# Patient Record
Sex: Female | Born: 1955 | Race: White | Hispanic: No | Marital: Married | Smoking: Never smoker
Health system: Southern US, Community
[De-identification: ages and names within clinical notes are randomized; demographics above are authoritative.]

## PROBLEM LIST (undated history)

## (undated) DIAGNOSIS — E119 Type 2 diabetes mellitus without complications: Secondary | ICD-10-CM

## (undated) DIAGNOSIS — K219 Gastro-esophageal reflux disease without esophagitis: Secondary | ICD-10-CM

## (undated) DIAGNOSIS — D510 Vitamin B12 deficiency anemia due to intrinsic factor deficiency: Secondary | ICD-10-CM

## (undated) DIAGNOSIS — K200 Eosinophilic esophagitis: Secondary | ICD-10-CM

## (undated) DIAGNOSIS — E785 Hyperlipidemia, unspecified: Secondary | ICD-10-CM

## (undated) DIAGNOSIS — U071 COVID-19: Secondary | ICD-10-CM

## (undated) DIAGNOSIS — F32A Depression, unspecified: Secondary | ICD-10-CM

## (undated) DIAGNOSIS — F329 Major depressive disorder, single episode, unspecified: Secondary | ICD-10-CM

## (undated) HISTORY — DX: Hyperlipidemia, unspecified: E78.5

## (undated) HISTORY — PX: BREAST SURGERY: SHX581

## (undated) HISTORY — DX: Type 2 diabetes mellitus without complications: E11.9

## (undated) HISTORY — DX: Depression, unspecified: F32.A

## (undated) HISTORY — PX: CHOLECYSTECTOMY OPEN: SUR202

## (undated) HISTORY — DX: Eosinophilic esophagitis: K20.0

## (undated) HISTORY — DX: COVID-19: U07.1

---
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
## 2004-11-29 HISTORY — PX: ABDOMINAL HYSTERECTOMY: SHX81

## 2004-11-29 HISTORY — PX: SUPRACERVICAL ABDOMINAL HYSTERECTOMY: SHX5393

## 2007-11-30 HISTORY — PX: BREAST EXCISIONAL BIOPSY: SUR124

## 2017-11-29 DIAGNOSIS — K222 Esophageal obstruction: Secondary | ICD-10-CM

## 2017-11-29 HISTORY — DX: Esophageal obstruction: K22.2

## 2020-01-30 ENCOUNTER — Ambulatory Visit (INDEPENDENT_AMBULATORY_CARE_PROVIDER_SITE_OTHER): Payer: 59 | Admitting: Family

## 2020-01-30 ENCOUNTER — Other Ambulatory Visit: Payer: Self-pay

## 2020-01-30 ENCOUNTER — Encounter: Payer: Self-pay | Admitting: Family

## 2020-01-30 DIAGNOSIS — E119 Type 2 diabetes mellitus without complications: Secondary | ICD-10-CM | POA: Diagnosis not present

## 2020-01-30 DIAGNOSIS — R454 Irritability and anger: Secondary | ICD-10-CM | POA: Diagnosis not present

## 2020-01-30 DIAGNOSIS — Z0000 Encounter for general adult medical examination without abnormal findings: Secondary | ICD-10-CM | POA: Insufficient documentation

## 2020-01-30 DIAGNOSIS — K219 Gastro-esophageal reflux disease without esophagitis: Secondary | ICD-10-CM | POA: Diagnosis not present

## 2020-01-30 DIAGNOSIS — E785 Hyperlipidemia, unspecified: Secondary | ICD-10-CM | POA: Insufficient documentation

## 2020-01-30 MED ORDER — ONETOUCH VERIO W/DEVICE KIT: 1.0000 | PACK | Freq: Every day | 0 refills | Status: DC | PRN

## 2020-01-30 MED ORDER — CITALOPRAM HYDROBROMIDE 20 MG PO TABS: 20.0000 mg | ORAL_TABLET | Freq: Every day | ORAL | 1 refills | Status: DC

## 2020-01-30 MED ORDER — ONETOUCH ULTRASOFT LANCETS MISC: 4 refills | Status: AC

## 2020-01-30 MED ORDER — ONETOUCH VERIO VI STRP: ORAL_STRIP | 4 refills | Status: DC

## 2020-01-30 NOTE — Assessment & Plan Note (Signed)
Reviewed past medical history.  Advised patient to return the office for CPE, and pelvic exam in particular to determine if she has a cervix

## 2020-01-30 NOTE — Assessment & Plan Note (Signed)
Suspect controlled.  Pending lipid panel.  Discussed with patient overall cardiovascular risk, will calculate this with the have total cholesterol, HDL etc. however I do not see a reason for patient to be on 325 mg of ASA, advised her to take 81 mg tablet asa.  advised her that as she approaches her 70s, we should readdress the risk versus benefit of this medication

## 2020-01-30 NOTE — Assessment & Plan Note (Signed)
Uncontrolled, will increase Celexa 20 mg which patient has been on in the past that has worked well.

## 2020-01-30 NOTE — Progress Notes (Signed)
Virtual Visit via Video Note  I connected with@  on 01/30/20 at 10:00 AM EST by a video enabled telemedicine application and verified that I am speaking with the correct person using two identifiers.  Location patient: home Location provider:work Persons participating in the virtual visit: patient, provider  I discussed the limitations of evaluation and management by telemedicine and the availability of in person appointments. The patient expressed understanding and agreed to proceed.   HPI: Moved from [place] 6 months ago.  Establish care Feels well today. No complaints today  HTN- never had high blood pressure except in doctors office. Doesn't check BP at home. No cp, sob.   HLD- compliant with lipitor.   DM- compliant with medication. Not checking blood sugar. Has numbness in feet 'when sugars are elevated.' Last a1c 7.1. Needs new glucometer.   GERD/ eosinophilic esophagitis- on protonix. No regurgitation, pain or trouble swallowing.   Mood irritably and anxiety- started during menopause when irritible and when mother passed away. Felt better on celexa 18m. Feels 'short fused.'  No si/hi.   On asa 3265m No h/o mi, stent, cad.    ROS: See pertinent positives and negatives per HPI.  Past Medical History:  Diagnosis Date  . Depression   . Diabetes mellitus without complication ([place]  . Eosinophilic esophagitis   . Esophageal stricture 2019  . Hyperlipidemia     Past Surgical History:  Procedure Laterality Date  . CHOLECYSTECTOMY OPEN    . HYSTEROTOMY  2006   has ovaries. Had heavy periods and was anemic.     History reviewed. No pertinent family history.  .   Current Outpatient Medications:  .  aspirin EC 81 MG tablet, Take 81 mg by mouth daily., Disp: , Rfl:  .  atorvastatin (LIPITOR) 40 MG tablet, Take 40 mg by mouth daily., Disp: , Rfl:  .  Calcium-Vitamin D-Vitamin K (CALCIUM SOFT CHEWS PO), Take 1 tablet by mouth daily., Disp: , Rfl:  .   Cholecalciferol (VITAMIN D3) 50 MCG (2000 UT) capsule, Take 2,000 Units by mouth daily., Disp: , Rfl:  .  citalopram (CELEXA) 20 MG tablet, Take 1 tablet (20 mg total) by mouth daily., Disp: 90 tablet, Rfl: 1 .  glipiZIDE (GLUCOTROL XL) 10 MG 24 hr tablet, Take 10 mg by mouth daily with breakfast., Disp: , Rfl:  .  lisinopril (ZESTRIL) 5 MG tablet, Take 5 mg by mouth daily., Disp: , Rfl:  .  metFORMIN (GLUCOPHAGE) 1000 MG tablet, Take 1,000 mg by mouth 2 (two) times daily with a meal., Disp: , Rfl:  .  Multiple Vitamins-Minerals (CENTRUM SILVER ADULT 50+ PO), Take 1 tablet by mouth daily., Disp: , Rfl:  .  pantoprazole (PROTONIX) 40 MG tablet, Take 40 mg by mouth daily., Disp: , Rfl:  .  Blood Glucose Monitoring Suppl (ONETOUCH VERIO) w/Device KIT, 1 Device by Does not apply route daily as needed., Disp: 1 kit, Rfl: 0 .  glucose blood (ONETOUCH VERIO) test strip, Used to check blood sugars once a day., Disp: 100 each, Rfl: 4 .  Lancets (ONETOUCH ULTRASOFT) lancets, Used to check blood sugars once a day., Disp: 100 each, Rfl: 4  EXAM:  VITALS per patient if applicable:  GENERAL: alert, oriented, appears well and in no acute distress  HEENT: atraumatic, conjunttiva clear, no obvious abnormalities on inspection of external nose and ears  NECK: normal movements of the head and neck  LUNGS: on inspection no signs of respiratory distress, breathing rate appears normal, no  obvious gross SOB, gasping or wheezing  CV: no obvious cyanosis  MS: moves all visible extremities without noticeable abnormality  PSYCH/NEURO: pleasant and cooperative, no obvious depression or anxiety, speech and thought processing grossly intact  ASSESSMENT AND PLAN:  Discussed the following assessment and plan:  Encounter for medical examination to establish care - Plan: Ambulatory referral to Gastroenterology, MM 3D SCREEN BREAST BILATERAL, CBC with Differential/Platelet, Comprehensive metabolic panel, Hemoglobin  A1c, Lipid panel, TSH, VITAMIN D 25 Hydroxy (Vit-D Deficiency, Fractures), HIV Antibody (routine testing w rflx), Hepatitis C antibody, citalopram (CELEXA) 20 MG tablet  Type 2 diabetes mellitus without complication, without long-term current use of insulin ([place]) - Plan: Hemoglobin A1c  Hyperlipidemia, unspecified hyperlipidemia type  Gastroesophageal reflux disease, unspecified whether esophagitis present  Irritable mood Problem List Items Addressed This Visit      Digestive   GERD (gastroesophageal reflux disease)    No apparent alarm features today however patient does have history of esophageal stricture, EoE.  Referral to GI for further surveillance and management.  Patient also due for colonoscopy and I suspect EGD as well. Will follow.       Relevant Medications   pantoprazole (PROTONIX) 40 MG tablet     Endocrine   DM (diabetes mellitus) ([place])    Per patient likely uncontrolled.  Pending A1c.      Relevant Medications   metFORMIN (GLUCOPHAGE) 1000 MG tablet   glipiZIDE (GLUCOTROL XL) 10 MG 24 hr tablet   lisinopril (ZESTRIL) 5 MG tablet   atorvastatin (LIPITOR) 40 MG tablet   aspirin EC 81 MG tablet   Other Relevant Orders   Hemoglobin A1c     Other   Encounter for medical examination to establish care    Reviewed past medical history.  Advised patient to return the office for CPE, and pelvic exam in particular to determine if she has a cervix      Relevant Medications   citalopram (CELEXA) 20 MG tablet   Other Relevant Orders   Ambulatory referral to Gastroenterology   MM 3D SCREEN BREAST BILATERAL   CBC with Differential/Platelet   Comprehensive metabolic panel   Hemoglobin A1c   Lipid panel   TSH   VITAMIN D 25 Hydroxy (Vit-D Deficiency, Fractures)   HIV Antibody (routine testing w rflx)   Hepatitis C antibody   HLD (hyperlipidemia)    Suspect controlled.  Pending lipid panel.  Discussed with patient overall cardiovascular risk, will calculate this with  the have total cholesterol, HDL etc. however I do not see a reason for patient to be on 325 mg of ASA, advised her to take 81 mg tablet asa.  advised her that as she approaches her 70s, we should readdress the risk versus benefit of this medication      Relevant Medications   lisinopril (ZESTRIL) 5 MG tablet   atorvastatin (LIPITOR) 40 MG tablet   aspirin EC 81 MG tablet   Irritable mood    Uncontrolled, will increase Celexa 20 mg which patient has been on in the past that has worked well.          -we discussed possible serious and likely etiologies, options for evaluation and workup, limitations of telemedicine visit vs in person visit, treatment, treatment risks and precautions. Pt prefers to treat via telemedicine empirically rather then risking or undertaking an in person visit at this moment. Patient agrees to seek prompt in person care if worsening, new symptoms arise, or if is not improving with treatment.  I discussed the assessment and treatment plan with the patient. The patient was provided an opportunity to ask questions and all were answered. The patient agreed with the plan and demonstrated an understanding of the instructions.   The patient was advised to call back or seek an in-person evaluation if the symptoms worsen or if the condition fails to improve as anticipated.   Danielle Paris, FNP

## 2020-01-30 NOTE — Assessment & Plan Note (Signed)
Per patient likely uncontrolled.  Pending A1c.

## 2020-01-30 NOTE — Assessment & Plan Note (Signed)
No apparent alarm features today however patient does have history of esophageal stricture, EoE.  Referral to GI for further surveillance and management.  Patient also due for colonoscopy and I suspect EGD as well. Will follow.

## 2020-01-30 NOTE — Patient Instructions (Signed)
  Please call call and schedule your 3D mammogram  as discussed.   [facility]  [address]  [place], [place]  [phone]  

## 2020-01-31 ENCOUNTER — Encounter: Payer: Self-pay | Admitting: *Deleted

## 2020-02-08 ENCOUNTER — Other Ambulatory Visit: Payer: 59

## 2020-02-27 ENCOUNTER — Other Ambulatory Visit: Payer: Self-pay

## 2020-02-27 ENCOUNTER — Other Ambulatory Visit (INDEPENDENT_AMBULATORY_CARE_PROVIDER_SITE_OTHER): Payer: 59

## 2020-02-27 DIAGNOSIS — Z0000 Encounter for general adult medical examination without abnormal findings: Secondary | ICD-10-CM | POA: Diagnosis not present

## 2020-02-27 DIAGNOSIS — E119 Type 2 diabetes mellitus without complications: Secondary | ICD-10-CM | POA: Diagnosis not present

## 2020-02-27 LAB — LIPID PANEL
Cholesterol: 136 mg/dL (ref 0–200)
HDL: 50.1 mg/dL (ref >39.00)
LDL Cholesterol: 71 mg/dL (ref 0–99)
NonHDL: 86.22
Total CHOL/HDL Ratio: 3
Triglycerides: 74 mg/dL (ref 0.0–149.0)
VLDL: 14.8 mg/dL (ref 0.0–40.0)

## 2020-02-27 LAB — COMPREHENSIVE METABOLIC PANEL
ALT: 17 U/L (ref 0–35)
AST: 17 U/L (ref 0–37)
Albumin: 4.2 g/dL (ref 3.5–5.2)
Alkaline Phosphatase: 91 U/L (ref 39–117)
BUN: 15 mg/dL (ref 6–23)
CO2: 28 mEq/L (ref 19–32)
Calcium: 9.3 mg/dL (ref 8.4–10.5)
Chloride: 102 mEq/L (ref 96–112)
Creatinine, Ser: 0.67 mg/dL (ref 0.40–1.20)
GFR: 88.65 mL/min (ref >60.00)
Glucose, Bld: 161 mg/dL — ABNORMAL HIGH (ref 70–99)
Potassium: 4.1 mEq/L (ref 3.5–5.1)
Sodium: 138 mEq/L (ref 135–145)
Total Bilirubin: 0.4 mg/dL (ref 0.2–1.2)
Total Protein: 6.8 g/dL (ref 6.0–8.3)

## 2020-02-27 LAB — CBC WITH DIFFERENTIAL/PLATELET
Basophils Absolute: 0 10*3/uL (ref 0.0–0.1)
Basophils Relative: 0.4 % (ref 0.0–3.0)
Eosinophils Absolute: 0.2 10*3/uL (ref 0.0–0.7)
Eosinophils Relative: 2.8 % (ref 0.0–5.0)
HCT: 35.9 % — ABNORMAL LOW (ref 36.0–46.0)
Hemoglobin: 12 g/dL (ref 12.0–15.0)
Lymphocytes Relative: 29.3 % (ref 12.0–46.0)
Lymphs Abs: 1.7 10*3/uL (ref 0.7–4.0)
MCHC: 33.4 g/dL (ref 30.0–36.0)
MCV: 83.1 fl (ref 78.0–100.0)
Monocytes Absolute: 0.4 10*3/uL (ref 0.1–1.0)
Monocytes Relative: 6.3 % (ref 3.0–12.0)
Neutro Abs: 3.6 10*3/uL (ref 1.4–7.7)
Neutrophils Relative %: 61.2 % (ref 43.0–77.0)
Platelets: 182 10*3/uL (ref 150.0–400.0)
RBC: 4.32 Mil/uL (ref 3.87–5.11)
RDW: 14.4 % (ref 11.5–15.5)
WBC: 5.9 10*3/uL (ref 4.0–10.5)

## 2020-02-27 LAB — VITAMIN D 25 HYDROXY (VIT D DEFICIENCY, FRACTURES): VITD: 46.51 ng/mL (ref 30.00–100.00)

## 2020-02-27 LAB — TSH: TSH: 2.1 u[IU]/mL (ref 0.35–4.50)

## 2020-02-27 LAB — HEMOGLOBIN A1C: Hgb A1c MFr Bld: 7.6 % — ABNORMAL HIGH (ref 4.6–6.5)

## 2020-02-28 LAB — HEPATITIS C ANTIBODY
Hepatitis C Ab: NONREACTIVE
SIGNAL TO CUT-OFF: 0.01 (ref <1.00)

## 2020-02-28 LAB — HIV ANTIBODY (ROUTINE TESTING W REFLEX): HIV 1&2 Ab, 4th Generation: NONREACTIVE

## 2020-03-04 ENCOUNTER — Telehealth: Payer: Self-pay | Admitting: Family

## 2020-03-04 NOTE — Telephone Encounter (Signed)
Patient returned office phone call from 03/03/2020.

## 2020-03-04 NOTE — Telephone Encounter (Signed)
I have spoken with patient. See result note.  

## 2020-03-04 NOTE — Telephone Encounter (Signed)
LMTCB for labs. 

## 2020-03-04 NOTE — Telephone Encounter (Signed)
Pt called returning your call. She wanted to let you know that she saw her lab results on mychart

## 2020-03-11 ENCOUNTER — Encounter: Payer: Self-pay | Admitting: Family

## 2020-03-11 ENCOUNTER — Telehealth: Payer: Self-pay | Admitting: Family

## 2020-03-11 NOTE — Telephone Encounter (Signed)
I have returned patient's call. See result note.

## 2020-03-11 NOTE — Telephone Encounter (Signed)
Pt returned your call.  

## 2020-03-21 ENCOUNTER — Other Ambulatory Visit: Payer: Self-pay

## 2020-03-21 ENCOUNTER — Telehealth: Payer: Self-pay | Admitting: Family

## 2020-03-21 DIAGNOSIS — Z0000 Encounter for general adult medical examination without abnormal findings: Secondary | ICD-10-CM

## 2020-03-21 MED ORDER — CITALOPRAM HYDROBROMIDE 20 MG PO TABS: 20.0000 mg | ORAL_TABLET | Freq: Every day | ORAL | 1 refills | Status: DC

## 2020-03-21 MED ORDER — PANTOPRAZOLE SODIUM 40 MG PO TBEC: 40.0000 mg | DELAYED_RELEASE_TABLET | Freq: Every day | ORAL | 2 refills | Status: DC

## 2020-03-21 NOTE — Telephone Encounter (Signed)
I have refilled & sent to [facility] for patient.

## 2020-03-21 NOTE — Telephone Encounter (Signed)
Pt called pharmacy and they are sending over a request for pantoprazole and she also needs citalopram. She said she had the citalopram prescription but they are renovating her house and she can't find her prescription and is wondering if she would fill it until she sees her in June?

## 2020-04-02 ENCOUNTER — Ambulatory Visit
Admission: RE | Admit: 2020-04-02 | Discharge: 2020-04-02 | Disposition: A | Discharge status: Home / Self Care | Payer: 59 | Source: Ambulatory Visit | Attending: Family | Admitting: Family

## 2020-04-02 DIAGNOSIS — Z0000 Encounter for general adult medical examination without abnormal findings: Secondary | ICD-10-CM

## 2020-04-02 DIAGNOSIS — Z1231 Encounter for screening mammogram for malignant neoplasm of breast: Secondary | ICD-10-CM | POA: Diagnosis not present

## 2020-05-02 ENCOUNTER — Encounter: Payer: 59 | Admitting: Family

## 2020-05-06 ENCOUNTER — Other Ambulatory Visit (HOSPITAL_COMMUNITY)
Admission: RE | Admit: 2020-05-06 | Discharge: 2020-05-06 | Disposition: A | Discharge status: Home / Self Care | Payer: 59 | Source: Ambulatory Visit | Attending: Family | Admitting: Family

## 2020-05-06 ENCOUNTER — Other Ambulatory Visit: Payer: Self-pay

## 2020-05-06 ENCOUNTER — Ambulatory Visit (INDEPENDENT_AMBULATORY_CARE_PROVIDER_SITE_OTHER): Payer: 59 | Admitting: Family

## 2020-05-06 ENCOUNTER — Encounter: Payer: Self-pay | Admitting: Family

## 2020-05-06 VITALS — BP 140/84 | HR 75 | Temp 96.9°F | Resp 15 | Ht 72.0 in | Wt 244.4 lb

## 2020-05-06 DIAGNOSIS — I10 Essential (primary) hypertension: Secondary | ICD-10-CM | POA: Diagnosis not present

## 2020-05-06 DIAGNOSIS — E119 Type 2 diabetes mellitus without complications: Secondary | ICD-10-CM

## 2020-05-06 DIAGNOSIS — R454 Irritability and anger: Secondary | ICD-10-CM | POA: Diagnosis not present

## 2020-05-06 DIAGNOSIS — Z0000 Encounter for general adult medical examination without abnormal findings: Secondary | ICD-10-CM | POA: Diagnosis not present

## 2020-05-06 DIAGNOSIS — Z23 Encounter for immunization: Secondary | ICD-10-CM

## 2020-05-06 DIAGNOSIS — R232 Flushing: Secondary | ICD-10-CM

## 2020-05-06 MED ORDER — AMLODIPINE BESYLATE 2.5 MG PO TABS: 2.5000 mg | ORAL_TABLET | Freq: Every day | ORAL | 3 refills | Status: DC

## 2020-05-06 NOTE — Assessment & Plan Note (Signed)
Patient has been called in regards to colonoscopy, she understands to schedule this

## 2020-05-06 NOTE — Patient Instructions (Addendum)
Start amlodipine  It is imperative that you are seen AT least twice per year for labs and monitoring. Monitor blood pressure at home and me 5-6 reading on separate days. Goal is less than 120/80, based on newest guidelines, however we certainly want to be less than 130/80;  if persistently higher, please make sooner follow up appointment so we can recheck you blood pressure and manage/ adjust medications.   Health Maintenance for Postmenopausal Women Menopause is a normal process in which your ability to get pregnant comes to an end. This process happens slowly over many months or years, usually between the ages of 33 and 47. Menopause is complete when you have missed your menstrual periods for 12 months. It is important to talk with your health care provider about some of the most common conditions that affect women after menopause (postmenopausal women). These include heart disease, cancer, and bone loss (osteoporosis). Adopting a healthy lifestyle and getting preventive care can help to promote your health and wellness. The actions you take can also lower your chances of developing some of these common conditions. What should I know about menopause? During menopause, you may get a number of symptoms, such as:  Hot flashes. These can be moderate or severe.  Night sweats.  Decrease in sex drive.  Mood swings.  Headaches.  Tiredness.  Irritability.  Memory problems.  Insomnia. Choosing to treat or not to treat these symptoms is a decision that you make with your health care provider. Do I need hormone replacement therapy?  Hormone replacement therapy is effective in treating symptoms that are caused by menopause, such as hot flashes and night sweats.  Hormone replacement carries certain risks, especially as you become older. If you are thinking about using estrogen or estrogen with progestin, discuss the benefits and risks with your health care provider. What is my risk for heart  disease and stroke? The risk of heart disease, heart attack, and stroke increases as you age. One of the causes may be a change in the body's hormones during menopause. This can affect how your body uses dietary fats, triglycerides, and cholesterol. Heart attack and stroke are medical emergencies. There are many things that you can do to help prevent heart disease and stroke. Watch your blood pressure  High blood pressure causes heart disease and increases the risk of stroke. This is more likely to develop in people who have high blood pressure readings, are of African descent, or are overweight.  Have your blood pressure checked: ? Every 3-5 years if you are 53-41 years of age. ? Every year if you are 89 years old or older. Eat a healthy diet   Eat a diet that includes plenty of vegetables, fruits, low-fat dairy products, and lean protein.  Do not eat a lot of foods that are high in solid fats, added sugars, or sodium. Get regular exercise Get regular exercise. This is one of the most important things you can do for your health. Most adults should:  Try to exercise for at least 150 minutes each week. The exercise should increase your heart rate and make you sweat (moderate-intensity exercise).  Try to do strengthening exercises at least twice each week. Do these in addition to the moderate-intensity exercise.  Spend less time sitting. Even light physical activity can be beneficial. Other tips  Work with your health care provider to achieve or maintain a healthy weight.  Do not use any products that contain nicotine or tobacco, such as cigarettes, e-cigarettes,  and chewing tobacco. If you need help quitting, ask your health care provider.  Know your numbers. Ask your health care provider to check your cholesterol and your blood sugar (glucose). Continue to have your blood tested as directed by your health care provider. Do I need screening for cancer? Depending on your health history  and family history, you may need to have cancer screening at different stages of your life. This may include screening for:  Breast cancer.  Cervical cancer.  Lung cancer.  Colorectal cancer. What is my risk for osteoporosis? After menopause, you may be at increased risk for osteoporosis. Osteoporosis is a condition in which bone destruction happens more quickly than new bone creation. To help prevent osteoporosis or the bone fractures that can happen because of osteoporosis, you may take the following actions:  If you are 30-81 years old, get at least 1,000 mg of calcium and at least 600 mg of vitamin D per day.  If you are older than age 7 but younger than age 20, get at least 1,200 mg of calcium and at least 600 mg of vitamin D per day.  If you are older than age 52, get at least 1,200 mg of calcium and at least 800 mg of vitamin D per day. Smoking and drinking excessive alcohol increase the risk of osteoporosis. Eat foods that are rich in calcium and vitamin D, and do weight-bearing exercises several times each week as directed by your health care provider. How does menopause affect my mental health? Depression may occur at any age, but it is more common as you become older. Common symptoms of depression include:  Low or sad mood.  Changes in sleep patterns.  Changes in appetite or eating patterns.  Feeling an overall lack of motivation or enjoyment of activities that you previously enjoyed.  Frequent crying spells. Talk with your health care provider if you think that you are experiencing depression. General instructions See your health care provider for regular wellness exams and vaccines. This may include:  Scheduling regular health, dental, and eye exams.  Getting and maintaining your vaccines. These include: ? Influenza vaccine. Get this vaccine each year before the flu season begins. ? Pneumonia vaccine. ? Shingles vaccine. ? Tetanus, diphtheria, and pertussis  (Tdap) booster vaccine. Your health care provider may also recommend other immunizations. Tell your health care provider if you have ever been abused or do not feel safe at home. Summary  Menopause is a normal process in which your ability to get pregnant comes to an end.  This condition causes hot flashes, night sweats, decreased interest in sex, mood swings, headaches, or lack of sleep.  Treatment for this condition may include hormone replacement therapy.  Take actions to keep yourself healthy, including exercising regularly, eating a healthy diet, watching your weight, and checking your blood pressure and blood sugar levels.  Get screened for cancer and depression. Make sure that you are up to date with all your vaccines. This information is not intended to replace advice given to you by your health care provider. Make sure you discuss any questions you have with your health care provider. Document Revised: 11/08/2018 Document Reviewed: 11/08/2018 Elsevier Patient Education  2020 [employer].

## 2020-05-06 NOTE — Progress Notes (Signed)
Subjective:    Patient ID: Danielle Dickerson, female    DOB: 1956/07/15, 64 y.o.   MRN: [id number]  CC: Danielle Dickerson is a 64 y.o. female who presents today for physical exam.    HPI: DM- compliant taking glipizide and metformin. Not checking blood sugar.   Continues to have hot flashes at night , random in occurrence and less frequent, around head and face. Not drenching the sheets. Doesn't feel that is low blood sugar.  No vaginal bleeding.  No unusual weight loss, bone pain, fever.  HTN- compliant with medications. At home, 132/70   Depression- doing well on celexa, improved on higher dose.     TSH 2.10 2 months ago.   Colorectal Cancer Screening: due. Has paperpwork from GI and will contact them to schedule since has been covid vaccinated.   Breast Cancer Screening: Mammogram UTD Cervical Cancer Screening: due; history of hysterectomy for noncancerous reasons. Thinks that she has cervix  Bone Health screening/DEXA for 65+: No increased fracture risk. Defer screening at this time. Lung Cancer Screening: Doesn't have 30 year pack year history and age > 28 years. Immunizations       Tetanus - due        Pneumococcal - Candidate for.   Labs: Screening labs today. Exercise: Gets regular exercise.  Alcohol use: none Smoking/tobacco use: Nonsmoker.    HISTORY:  Past Medical History:  Diagnosis Date  . Depression   . Diabetes mellitus without complication ([place])   . Eosinophilic esophagitis   . Esophageal stricture 2019  . Hyperlipidemia     Past Surgical History:  Procedure Laterality Date  . ABDOMINAL HYSTERECTOMY  2006   has ovaries. Had heavy periods and was anemic. HAS cervix  . BREAST EXCISIONAL BIOPSY Right 2009   phyllodes removed   . CHOLECYSTECTOMY OPEN     History reviewed. No pertinent family history.    ALLERGIES: Patient has no known allergies.  Current Outpatient Medications on File Prior to Visit  Medication Sig Dispense Refill  . aspirin EC 81  MG tablet Take 81 mg by mouth daily.    Marland Kitchen atorvastatin (LIPITOR) 40 MG tablet Take 40 mg by mouth daily.    . Blood Glucose Monitoring Suppl (ONETOUCH VERIO) w/Device KIT 1 Device by Does not apply route daily as needed. 1 kit 0  . Calcium-Vitamin D-Vitamin K (CALCIUM SOFT CHEWS PO) Take 1 tablet by mouth daily.    . Cholecalciferol (VITAMIN D3) 50 MCG (2000 UT) capsule Take 2,000 Units by mouth daily.    . citalopram (CELEXA) 20 MG tablet Take 1 tablet (20 mg total) by mouth daily. 90 tablet 1  . glipiZIDE (GLUCOTROL XL) 10 MG 24 hr tablet Take 10 mg by mouth daily with breakfast.    . glucose blood (ONETOUCH VERIO) test strip Used to check blood sugars once a day. 100 each 4  . Lancets (ONETOUCH ULTRASOFT) lancets Used to check blood sugars once a day. 100 each 4  . lisinopril (ZESTRIL) 5 MG tablet Take 5 mg by mouth daily.    . metFORMIN (GLUCOPHAGE) 1000 MG tablet Take 1,000 mg by mouth 2 (two) times daily with a meal.    . Multiple Vitamins-Minerals (CENTRUM SILVER ADULT 50+ PO) Take 1 tablet by mouth daily.    . pantoprazole (PROTONIX) 40 MG tablet Take 1 tablet (40 mg total) by mouth daily. 30 tablet 2   No current facility-administered medications on file prior to visit.    Social History  Tobacco Use  . Smoking status: Never Smoker  . Smokeless tobacco: Never Used  Substance Use Topics  . Alcohol use: Not Currently  . Drug use: Never    Review of Systems  Constitutional: Positive for diaphoresis. Negative for chills, fever and unexpected weight change.  HENT: Negative for congestion.   Respiratory: Negative for cough.   Cardiovascular: Negative for chest pain, palpitations and leg swelling.  Gastrointestinal: Negative for nausea and vomiting.  Genitourinary: Negative for vaginal bleeding.  Musculoskeletal: Negative for arthralgias and myalgias.  Skin: Negative for rash.  Neurological: Negative for headaches.  Hematological: Negative for adenopathy.    Psychiatric/Behavioral: Negative for confusion. The patient is not nervous/anxious.       Objective:    BP 140/84 (BP Location: Left Arm, Patient Position: Sitting, Cuff Size: Normal)   Pulse 75   Temp (!) 96.9 F (36.1 C) (Temporal)   Resp 15   Ht 6' (1.829 m)   Wt 244 lb 6.4 oz (110.9 kg)   SpO2 97%   BMI 33.15 kg/m   BP Readings from Last 3 Encounters:  05/06/20 140/84   Wt Readings from Last 3 Encounters:  05/06/20 244 lb 6.4 oz (110.9 kg)  01/30/20 240 lb (108.9 kg)    Physical Exam Vitals reviewed.  Constitutional:      Appearance: She is well-developed.  Eyes:     Conjunctiva/sclera: Conjunctivae normal.  Neck:     Thyroid: No thyroid mass or thyromegaly.  Cardiovascular:     Rate and Rhythm: Normal rate and regular rhythm.     Pulses: Normal pulses.     Heart sounds: Normal heart sounds.  Pulmonary:     Effort: Pulmonary effort is normal.     Breath sounds: Normal breath sounds. No wheezing, rhonchi or rales.  Chest:     Breasts: Breasts are symmetrical.        Right: No inverted nipple, mass, nipple discharge, skin change or tenderness.        Left: No inverted nipple, mass, nipple discharge, skin change or tenderness.  Genitourinary:    Cervix: No cervical motion tenderness, discharge or friability.     Uterus: Not enlarged, not fixed and not tender.      Adnexa:        Right: No mass, tenderness or fullness.         Left: No mass, tenderness or fullness.       Comments: Pap performed. No CMT. Unable to appreciated ovaries. Lymphadenopathy:     Head:     Right side of head: No submental, submandibular, tonsillar, preauricular, posterior auricular or occipital adenopathy.     Left side of head: No submental, submandibular, tonsillar, preauricular, posterior auricular or occipital adenopathy.     Cervical:     Right cervical: No superficial, deep or posterior cervical adenopathy.    Left cervical: No superficial, deep or posterior cervical adenopathy.      Upper Body:     Right upper body: No pectoral adenopathy.     Left upper body: No pectoral adenopathy.  Skin:    General: Skin is warm and dry.  Neurological:     Mental Status: She is alert.  Psychiatric:        Speech: Speech normal.        Behavior: Behavior normal.        Thought Content: Thought content normal.        Assessment & Plan:   Problem List Items Addressed This Visit  Cardiovascular and Mediastinum   Hot flashes    Improving overall.  No B symptoms at this time.  Patient will continue to monitor and politely declines further evaluation or trial gabapentin at this time      Relevant Medications   amLODipine (NORVASC) 2.5 MG tablet   HTN (hypertension)    Elevated today, we opted to augment with amlodipine versus increasing lisinopril as patient did not want to come back for labs in 1 week which was appropriate.   She understands to monitor blood pressure at home.      Relevant Medications   amLODipine (NORVASC) 2.5 MG tablet     Endocrine   DM (diabetes mellitus) (HCC)   Relevant Orders   Hemoglobin A1c     Other   Irritable mood    Improved on increased dose of Lexapro, will continue      Routine physical examination - Primary    Patient has been called in regards to colonoscopy, she understands to schedule this      Relevant Orders   Cytology - PAP    Other Visit Diagnoses    Need for 23-polyvalent pneumococcal polysaccharide vaccine       Relevant Orders   Pneumococcal polysaccharide vaccine 23-valent greater than or equal to 2yo subcutaneous/IM (Completed)   Need for diphtheria-tetanus-pertussis (Tdap) vaccine       Relevant Orders   Tdap vaccine greater than or equal to 7yo IM (Completed)       I am having [name] start on amLODipine. I am also having her maintain her metFORMIN, glipiZIDE, lisinopril, atorvastatin, Vitamin D3, Multiple Vitamins-Minerals (CENTRUM SILVER ADULT 50+ PO), Calcium-Vitamin D-Vitamin K (CALCIUM  SOFT CHEWS PO), aspirin EC, OneTouch Verio, OneTouch Verio, onetouch ultrasoft, pantoprazole, and citalopram.   Meds ordered this encounter  Medications  . amLODipine (NORVASC) 2.5 MG tablet    Sig: Take 1 tablet (2.5 mg total) by mouth daily.    Dispense:  90 tablet    Refill:  3    Order Specific Question:   Supervising Provider    Answer:   Crecencio Mc [[id number]]    Return precautions given.   Risks, benefits, and alternatives of the medications and treatment plan prescribed today were discussed, and patient expressed understanding.   Education regarding symptom management and diagnosis given to patient on AVS.   Continue to follow with Burnard Hawthorne, FNP for routine health maintenance.   [name] and I agreed with plan.   Mable Paris, FNP

## 2020-05-06 NOTE — Assessment & Plan Note (Signed)
Improved on increased dose of Lexapro, will continue

## 2020-05-06 NOTE — Assessment & Plan Note (Addendum)
Improving overall.  No B symptoms at this time.  Patient will continue to monitor and politely declines further evaluation or trial gabapentin at this time

## 2020-05-06 NOTE — Assessment & Plan Note (Signed)
Elevated today, we opted to augment with amlodipine versus increasing lisinopril as patient did not want to come back for labs in 1 week which was appropriate.   She understands to monitor blood pressure at home.

## 2020-05-07 ENCOUNTER — Telehealth: Payer: Self-pay | Admitting: Family

## 2020-05-07 NOTE — Telephone Encounter (Signed)
Called patient to ask about bruising. Danielle Dickerson states that she has a bruise on her arm around the size of a quarter. She is able to move arm freely but it feels as if the arm is strained. The pain is not around the injection site but around her Bicep and it is swollen. Has only iced arm today and has not taken any ibuprofen. Experiencing no Fever, nausea, or vomiting. Rates pain as a 7 on pain scale

## 2020-05-07 NOTE — Telephone Encounter (Signed)
Pt called in stated that she received shots in both arms on Tuesday and the right arm  was applied high which was the pneumonia shot is bruising and in pain now

## 2020-05-07 NOTE — Telephone Encounter (Signed)
Call pt Please get more information Size, location of bruise Is she able to move her arms freely Symptoms improving Any systematic features such as fever, nausea, vomiting? Has she tried ibuprofen for pain and ice ?

## 2020-05-07 NOTE — Telephone Encounter (Signed)
A little perplexed as to why bicep is swollen. I would expect swelling at injection site I would advise her to have this evaluated at urgent care [facility]

## 2020-05-08 ENCOUNTER — Other Ambulatory Visit: Payer: Self-pay

## 2020-05-08 ENCOUNTER — Ambulatory Visit
Admission: RE | Admit: 2020-05-08 | Discharge: 2020-05-08 | Disposition: A | Discharge status: Home / Self Care | Payer: 59 | Source: Ambulatory Visit | Attending: Emergency Medicine | Admitting: Emergency Medicine

## 2020-05-08 ENCOUNTER — Ambulatory Visit
Admission: EM | Admit: 2020-05-08 | Discharge: 2020-05-08 | Disposition: A | Discharge status: Home / Self Care | Payer: 59 | Attending: Emergency Medicine | Admitting: Emergency Medicine

## 2020-05-08 ENCOUNTER — Encounter: Payer: Self-pay | Admitting: Internal Medicine

## 2020-05-08 ENCOUNTER — Other Ambulatory Visit: Payer: Self-pay | Admitting: Internal Medicine

## 2020-05-08 ENCOUNTER — Ambulatory Visit: Payer: 59

## 2020-05-08 DIAGNOSIS — R600 Localized edema: Secondary | ICD-10-CM | POA: Diagnosis not present

## 2020-05-08 DIAGNOSIS — R87610 Atypical squamous cells of undetermined significance on cytologic smear of cervix (ASC-US): Secondary | ICD-10-CM

## 2020-05-08 DIAGNOSIS — M7989 Other specified soft tissue disorders: Secondary | ICD-10-CM | POA: Diagnosis not present

## 2020-05-08 DIAGNOSIS — L03113 Cellulitis of right upper limb: Secondary | ICD-10-CM

## 2020-05-08 LAB — CYTOLOGY - PAP
Comment: NEGATIVE
Diagnosis: UNDETERMINED — AB
High risk HPV: NEGATIVE

## 2020-05-08 MED ORDER — CEPHALEXIN 500 MG PO CAPS
500.0000 mg | ORAL_CAPSULE | Freq: Three times a day (TID) | ORAL | 0 refills | Status: AC
Start: 2020-05-08 — End: 2020-05-15

## 2020-05-08 NOTE — Telephone Encounter (Signed)
Did you send this "Please advise " to me in error?  I have never seen patient

## 2020-05-08 NOTE — Telephone Encounter (Signed)
Pt is having redness on bicep and warm to the touch. Pt states that she is going to head over to the UC across the street from our office. Pt is worried about blood clot. Please advise

## 2020-05-08 NOTE — ED Notes (Signed)
Patient scheduled for an outpatient ultrasound at [facility] as a work in. Patient advised of where to go and procedures following. Patient verbalized understanding.

## 2020-05-08 NOTE — ED Triage Notes (Signed)
Patient states that she had a pneumonia shot on Monday in her right arm. Patient states that since the shot she has noticed redness and pain with swelling. Patient has area below the injection site which is warm to the touch. Patient states that she has been icing the area without much relief.

## 2020-05-08 NOTE — Telephone Encounter (Signed)
Arnett out of office has to be sent to MD as FYI patient is currently in ER.

## 2020-05-08 NOTE — ED Provider Notes (Signed)
[facility] VIDEO VISITS    CSN: [id number] Arrival date & time: 05/08/20  0855      History   Chief Complaint Chief Complaint  Patient presents with  . Allergic Reaction  . Arm Pain    right    HPI Danielle Dickerson is a 64 y.o. female.   Patient presents with redness, warmth, swelling in her right upper arm x2 days.  She states she had a pneumonia shot in that arm 3 days ago but the area of concern is not where the shot was given. She is concerned about a blood clot.  She also reports a mild headache.  She denies fever, chills, shortness of breath, weakness, numbness, or other symptoms.  No known injury.  She has attempted treatment at home with ice packs without relief.   No history of DVT or PE.     The history is provided by the patient.    Past Medical History:  Diagnosis Date  . Depression   . Diabetes mellitus without complication ([place])   . Eosinophilic esophagitis   . Esophageal stricture 2019  . Hyperlipidemia     Patient Active Problem List   Diagnosis Date Noted  . HTN (hypertension) 05/06/2020  . Hot flashes 05/06/2020  . Routine physical examination 01/30/2020  . DM (diabetes mellitus) ([place]) 01/30/2020  . HLD (hyperlipidemia) 01/30/2020  . GERD (gastroesophageal reflux disease) 01/30/2020  . Irritable mood 01/30/2020    Past Surgical History:  Procedure Laterality Date  . ABDOMINAL HYSTERECTOMY  2006   has ovaries. Had heavy periods and was anemic. HAS cervix  . BREAST EXCISIONAL BIOPSY Right 2009   phyllodes removed   . CHOLECYSTECTOMY OPEN      OB History   No obstetric history on file.      Home Medications    Prior to Admission medications   Medication Sig Start Date End Date Taking? Authorizing Provider  amLODipine (NORVASC) 2.5 MG tablet Take 1 tablet (2.5 mg total) by mouth daily. 05/06/20  Yes Burnard Hawthorne, FNP  aspirin EC 81 MG tablet Take 81 mg by mouth daily.   Yes [provider]  atorvastatin (LIPITOR) 40 MG tablet Take  40 mg by mouth daily.   Yes [provider]  Blood Glucose Monitoring Suppl (ONETOUCH VERIO) w/Device KIT 1 Device by Does not apply route daily as needed. 01/30/20  Yes Arnett, Yvetta Coder, FNP  Calcium-Vitamin D-Vitamin K (CALCIUM SOFT CHEWS PO) Take 1 tablet by mouth daily.   Yes [provider]  Cholecalciferol (VITAMIN D3) 50 MCG (2000 UT) capsule Take 2,000 Units by mouth daily.   Yes [provider]  citalopram (CELEXA) 20 MG tablet Take 1 tablet (20 mg total) by mouth daily. 03/21/20  Yes Arnett, Yvetta Coder, FNP  glipiZIDE (GLUCOTROL XL) 10 MG 24 hr tablet Take 10 mg by mouth daily with breakfast.   Yes [provider]  glucose blood (ONETOUCH VERIO) test strip Used to check blood sugars once a day. 01/30/20  Yes Arnett, Yvetta Coder, FNP  Lancets [facility] ULTRASOFT) lancets Used to check blood sugars once a day. 01/30/20  Yes Arnett, Yvetta Coder, FNP  lisinopril (ZESTRIL) 5 MG tablet Take 5 mg by mouth daily.   Yes [provider]  metFORMIN (GLUCOPHAGE) 1000 MG tablet Take 1,000 mg by mouth 2 (two) times daily with a meal.   Yes [provider]  Multiple Vitamins-Minerals (CENTRUM SILVER ADULT 50+ PO) Take 1 tablet by mouth daily.   Yes [provider]  pantoprazole (PROTONIX) 40 MG tablet Take 1 tablet (40 mg total) by mouth daily. 03/21/20  Yes Arnett, Yvetta Coder, FNP  cephALEXin (KEFLEX) 500 MG capsule Take 1 capsule (500 mg total) by mouth 3 (three) times daily for 7 days. 05/08/20 05/15/20  Sharion Balloon, NP    Family History Family History  Problem Relation Age of Onset  . Congestive Heart Failure Mother   . Colon cancer Father     Social History Social History   Tobacco Use  . Smoking status: Never Smoker  . Smokeless tobacco: Never Used  Vaping Use  . Vaping Use: Never used  Substance Use Topics  . Alcohol use: Not Currently  . Drug use: Never     Allergies   Patient has no known allergies.   Review of  Systems Review of Systems  Constitutional: Negative for chills and fever.  HENT: Negative for ear pain and sore throat.   Eyes: Negative for pain and visual disturbance.  Respiratory: Negative for cough and shortness of breath.   Cardiovascular: Negative for chest pain and palpitations.  Gastrointestinal: Negative for abdominal pain and vomiting.  Genitourinary: Negative for dysuria and hematuria.  Musculoskeletal: Negative for arthralgias and back pain.  Skin: Positive for color change and rash.  Neurological: Negative for seizures, syncope, weakness and numbness.  All other systems reviewed and are negative.    Physical Exam Triage Vital Signs ED Triage Vitals  Enc Vitals Group     BP --      Pulse --      Resp --      Temp --      Temp src --      SpO2 --      Weight 05/08/20 0858 244 lb 7.8 oz (110.9 kg)     Height 05/08/20 0858 6' (1.829 m)     Head Circumference --      Peak Flow --      Pain Score 05/08/20 0857 3     Pain Loc --      Pain Edu? --      Excl. in [place]? --    No data found.  Updated Vital Signs BP 133/83 (BP Location: Left Arm)   Pulse 84   Temp 99.2 F (37.3 C) (Oral)   Resp 17   Ht 6' (1.829 m)   Wt 244 lb 7.8 oz (110.9 kg)   SpO2 97%   BMI 33.16 kg/m   Visual Acuity Right Eye Distance:   Left Eye Distance:   Bilateral Distance:    Right Eye Near:   Left Eye Near:    Bilateral Near:     Physical Exam Vitals and nursing note reviewed.  Constitutional:      General: She is not in acute distress.    Appearance: She is well-developed.  HENT:     Head: Normocephalic and atraumatic.     Mouth/Throat:     Mouth: Mucous membranes are moist.     Pharynx: Oropharynx is clear.  Eyes:     Conjunctiva/sclera: Conjunctivae normal.  Cardiovascular:     Rate and Rhythm: Normal rate and regular rhythm.     Heart sounds: Normal heart sounds. No murmur heard.   Pulmonary:     Effort: Pulmonary effort is normal. No respiratory distress.      Breath sounds: Normal breath sounds. No stridor. No wheezing or rhonchi.  Abdominal:     Palpations: Abdomen is soft.     Tenderness:  There is no abdominal tenderness. There is no guarding or rebound.  Musculoskeletal:        General: Swelling present. Normal range of motion.     Cervical back: Neck supple.  Skin:    General: Skin is warm and dry.     Findings: Erythema present.     Comments: Right upper arm erythema, warmth, edema.  See pictures for details.   Neurological:     General: No focal deficit present.     Mental Status: She is alert and oriented to person, place, and time.     Sensory: No sensory deficit.     Motor: No weakness.     Gait: Gait normal.  Psychiatric:        Mood and Affect: Mood normal.        Behavior: Behavior normal.          UC Treatments / Results  Labs (all labs ordered are listed, but only abnormal results are displayed) Labs Reviewed - No data to display  EKG   Radiology US Venous Img Upper Uni Right(DVT)  Result Date: 05/08/2020 CLINICAL DATA:  Redness, warmth and swelling of right upper extremity. EXAM: RIGHT UPPER EXTREMITY VENOUS DOPPLER ULTRASOUND TECHNIQUE: Gray-scale sonography with graded compression, as well as color Doppler and duplex ultrasound were performed to evaluate the upper extremity deep venous system from the level of the subclavian vein and including the jugular, axillary, basilic, radial, ulnar and upper cephalic vein. Spectral Doppler was utilized to evaluate flow at rest and with distal augmentation maneuvers. COMPARISON:  None. FINDINGS: Contralateral Subclavian Vein: Respiratory phasicity is normal and symmetric with the symptomatic side. No evidence of thrombus. Normal compressibility. Internal Jugular Vein: No evidence of thrombus. Normal compressibility, respiratory phasicity and response to augmentation. Subclavian Vein: No evidence of thrombus. Normal compressibility, respiratory phasicity and response to  augmentation. Axillary Vein: No evidence of thrombus. Normal compressibility, respiratory phasicity and response to augmentation. Cephalic Vein: No evidence of thrombus. Normal compressibility, respiratory phasicity and response to augmentation. Basilic Vein: No evidence of thrombus. Normal compressibility, respiratory phasicity and response to augmentation. Brachial Veins: No evidence of thrombus. Normal compressibility, respiratory phasicity and response to augmentation. Radial Veins: No evidence of thrombus. Normal compressibility, respiratory phasicity and response to augmentation. Ulnar Veins: No evidence of thrombus. Normal compressibility, respiratory phasicity and response to augmentation. Venous Reflux:  None visualized. Other Findings:  None visualized. IMPRESSION: No evidence of DVT within the right upper extremity. Electronically Signed   By: Kerby Moors M.D.   On: 05/08/2020 10:39    Procedures Procedures (including critical care time)  Medications Ordered in UC Medications - No data to display  Initial Impression / Assessment and Plan / UC Course  I have reviewed the triage vital signs and the nursing notes.  Pertinent labs & imaging results that were available during my care of the patient were reviewed by me and considered in my medical decision making (see chart for details).   Cellulitis and edema of right upper arm.  Ultrasound negative for DVT; discussed results with patient.  Treating cellulitis with Keflex.  Instructed patient to follow-up with her PCP or return here if she has increased redness or develops new symptoms such as fever or other concerns.  Patient agrees to plan of care.   Final Clinical Impressions(s) / UC Diagnoses   Final diagnoses:  Edema of right upper arm  Cellulitis of right upper arm     Discharge Instructions     Go  to [facility] to have the ultrasound of you right arm.  I will call you with the results after the test is  done.  Please do not leave the hospital until you hear from me.            ED Prescriptions    Medication Sig Dispense Auth. Provider   cephALEXin (KEFLEX) 500 MG capsule Take 1 capsule (500 mg total) by mouth 3 (three) times daily for 7 days. 21 capsule Sharion Balloon, NP     PDMP not reviewed this encounter.   Sharion Balloon, NP 05/08/20 1051

## 2020-05-08 NOTE — Discharge Instructions (Signed)
Go to [facility] to have the ultrasound of you right arm.  I will call you with the results after the test is done.  Please do not leave the hospital until you hear from me.

## 2020-06-16 ENCOUNTER — Telehealth: Payer: Self-pay | Admitting: Family

## 2020-06-16 MED ORDER — METFORMIN HCL 1000 MG PO TABS: 1000.0000 mg | ORAL_TABLET | Freq: Two times a day (BID) | ORAL | 1 refills | Status: DC

## 2020-06-16 MED ORDER — GLIPIZIDE ER 10 MG PO TB24: 10.0000 mg | ORAL_TABLET | Freq: Every day | ORAL | 1 refills | Status: DC

## 2020-06-16 NOTE — Telephone Encounter (Signed)
Pt needs refill on metFORMIN (GLUCOPHAGE) 1000 MG tablet and glipiZIDE (GLUCOTROL XL) 10 MG 24 hr tablet ASAP

## 2020-06-18 ENCOUNTER — Other Ambulatory Visit: Payer: Self-pay | Admitting: Family

## 2020-07-01 ENCOUNTER — Other Ambulatory Visit: Payer: 59

## 2020-07-09 ENCOUNTER — Other Ambulatory Visit: Payer: Self-pay | Admitting: Family

## 2020-07-09 DIAGNOSIS — Z0000 Encounter for general adult medical examination without abnormal findings: Secondary | ICD-10-CM

## 2020-07-22 ENCOUNTER — Other Ambulatory Visit: Payer: Self-pay

## 2020-07-22 ENCOUNTER — Other Ambulatory Visit (INDEPENDENT_AMBULATORY_CARE_PROVIDER_SITE_OTHER): Payer: 59

## 2020-07-22 DIAGNOSIS — E119 Type 2 diabetes mellitus without complications: Secondary | ICD-10-CM | POA: Diagnosis not present

## 2020-07-22 LAB — HEMOGLOBIN A1C: Hgb A1c MFr Bld: 8.4 % — ABNORMAL HIGH (ref 4.6–6.5)

## 2020-07-24 ENCOUNTER — Telehealth: Payer: 59

## 2020-07-24 ENCOUNTER — Telehealth: Payer: Self-pay

## 2020-07-24 NOTE — Telephone Encounter (Signed)
That's appropriate, thank you  RV

## 2020-07-24 NOTE — Telephone Encounter (Signed)
Patient was contacted to schedule her colonoscopy. Upon triage she asked if she was having an EGD as well.  Upon reviewing her referral I informed her that her referral noted eosinophilic esophagitis, GERD and screening colonoscopy.  I advised her that she will need an office visit to evaluate the esophagitis and GERD and during the appt her colonoscopy would be scheduled. She states her father had colon cancer.  Appt has been scheduled with Dr. Allegra Lai.  Thanks,  [place], [place]

## 2020-08-06 ENCOUNTER — Encounter: Payer: Self-pay | Admitting: Family

## 2020-08-06 ENCOUNTER — Other Ambulatory Visit: Payer: Self-pay | Admitting: Family

## 2020-08-06 ENCOUNTER — Telehealth (INDEPENDENT_AMBULATORY_CARE_PROVIDER_SITE_OTHER): Payer: 59 | Admitting: Family

## 2020-08-06 VITALS — Ht 72.0 in | Wt 244.0 lb

## 2020-08-06 DIAGNOSIS — I10 Essential (primary) hypertension: Secondary | ICD-10-CM | POA: Diagnosis not present

## 2020-08-06 DIAGNOSIS — E119 Type 2 diabetes mellitus without complications: Secondary | ICD-10-CM

## 2020-08-06 DIAGNOSIS — R454 Irritability and anger: Secondary | ICD-10-CM

## 2020-08-06 MED ORDER — LIRAGLUTIDE 18 MG/3ML ~~LOC~~ SOPN: PEN_INJECTOR | SUBCUTANEOUS | 12 refills | Status: DC

## 2020-08-06 NOTE — Progress Notes (Signed)
Virtual Visit via Video Note  I connected with@  on 08/06/20 at  9:00 AM EDT by a video enabled telemedicine application and verified that I am speaking with the correct person using two identifiers.  Location patient: home Location provider:work  Persons participating in the virtual visit: patient, provider  I discussed the limitations of evaluation and management by telemedicine and the availability of in person appointments. The patient expressed understanding and agreed to proceed.   HPI: Feels well  No new complaints.    DM-  Compliant with glipizide, metformin.   Riding peloton bike 30 minutes 4-5 per week.Started swimming. Frustrated by a1c. Doesn't eat much meat. Notes will have more cereal. Foods just dont sound good. hasnt been checking blood sugars. No vision changes. Breakfast is oatmeal. Eating a lot of watermelon. Lunch is PB sandwich with banana. Supper is cereal.   HTN- started amlodipine at last visit. Compliant with lisinopril. Hasnt checked BP at home. No cp, sob.     Irritability- increased celexa at last visit. Thinks dose has been helpful however reports stress at home with daughter. No si/hi.   Upcoming appointment with Dr Marius Ditch    ROS: See pertinent positives and negatives per HPI.  Past Medical History:  Diagnosis Date  . Depression   . Diabetes mellitus without complication ([place])   . Eosinophilic esophagitis   . Esophageal stricture 2019  . Hyperlipidemia     Past Surgical History:  Procedure Laterality Date  . ABDOMINAL HYSTERECTOMY  2006   has ovaries. Had heavy periods and was anemic. HAS cervix  . BREAST EXCISIONAL BIOPSY Right 2009   phyllodes removed   . CHOLECYSTECTOMY OPEN      Family History  Problem Relation Age of Onset  . Congestive Heart Failure Mother   . Colon cancer Father   . Esophageal cancer Maternal Uncle   . Thyroid cancer Neg Hx        Current Outpatient Medications:  .  amLODipine (NORVASC) 2.5 MG tablet,  Take 1 tablet (2.5 mg total) by mouth daily., Disp: 90 tablet, Rfl: 3 .  aspirin EC 81 MG tablet, Take 81 mg by mouth daily., Disp: , Rfl:  .  atorvastatin (LIPITOR) 40 MG tablet, Take 40 mg by mouth daily., Disp: , Rfl:  .  Blood Glucose Monitoring Suppl (ONETOUCH VERIO) w/Device KIT, 1 Device by Does not apply route daily as needed., Disp: 1 kit, Rfl: 0 .  Calcium-Vitamin D-Vitamin K (CALCIUM SOFT CHEWS PO), Take 1 tablet by mouth daily., Disp: , Rfl:  .  Cholecalciferol (VITAMIN D3) 50 MCG (2000 UT) capsule, Take 2,000 Units by mouth daily., Disp: , Rfl:  .  citalopram (CELEXA) 20 MG tablet, TAKE 1 TABLET BY MOUTH EVERY DAY, Disp: 90 tablet, Rfl: 2 .  glipiZIDE (GLUCOTROL XL) 10 MG 24 hr tablet, Take 1 tablet (10 mg total) by mouth daily with breakfast., Disp: 90 tablet, Rfl: 1 .  glucose blood (ONETOUCH VERIO) test strip, Used to check blood sugars once a day., Disp: 100 each, Rfl: 4 .  Lancets (ONETOUCH ULTRASOFT) lancets, Used to check blood sugars once a day., Disp: 100 each, Rfl: 4 .  lisinopril (ZESTRIL) 5 MG tablet, Take 5 mg by mouth daily., Disp: , Rfl:  .  metFORMIN (GLUCOPHAGE) 1000 MG tablet, Take 1 tablet (1,000 mg total) by mouth 2 (two) times daily with a meal., Disp: 90 tablet, Rfl: 1 .  Multiple Vitamins-Minerals (CENTRUM SILVER ADULT 50+ PO), Take 1 tablet by mouth daily.,  Disp: , Rfl:  .  pantoprazole (PROTONIX) 40 MG tablet, TAKE 1 TABLET BY MOUTH EVERY DAY, Disp: 30 tablet, Rfl: 2  EXAM:  VITALS per patient if applicable: Body mass index is 33.09 kg/m. Wt Readings from Last 3 Encounters:  08/06/20 244 lb (110.7 kg)  05/08/20 244 lb 7.8 oz (110.9 kg)  05/06/20 244 lb 6.4 oz (110.9 kg)    BP Readings from Last 3 Encounters:  05/08/20 133/83  05/06/20 140/84   GENERAL: alert, oriented, appears well and in no acute distress  HEENT: atraumatic, conjunttiva clear, no obvious abnormalities on inspection of external nose and ears  NECK: normal movements of the head  and neck  LUNGS: on inspection no signs of respiratory distress, breathing rate appears normal, no obvious gross SOB, gasping or wheezing  CV: no obvious cyanosis  MS: moves all visible extremities without noticeable abnormality  PSYCH/NEURO: pleasant and cooperative, no obvious depression or anxiety, speech and thought processing grossly intact  ASSESSMENT AND PLAN:  Discussed the following assessment and plan:  Type 2 diabetes mellitus without complication, without long-term current use of insulin ([place]) - Plan: Referral to Nutrition and Diabetes Services  Essential hypertension  Irritable mood Problem List Items Addressed This Visit      Cardiovascular and Mediastinum   HTN (hypertension)    Presume improved however patient hasnt checked blood pressure. She will call with readings.         Endocrine   DM (diabetes mellitus) ([place]) - Primary    Discussed low GI diet and referral to nutrition. Once patient confirms that maternal uncle DID NOT have thyroid cancer; she knows he had esophageal cancer, we will start victoza. Likely dc glipizide in the future. Alternatively we will start jardiance if we cannot start victoza.       Relevant Orders   Referral to Nutrition and Diabetes Services     Other   Irritable mood    Slight improved, patient declines making any further medication changes. She will continue celexa.         -we discussed possible serious and likely etiologies, options for evaluation and workup, limitations of telemedicine visit vs in person visit, treatment, treatment risks and precautions. Pt prefers to treat via telemedicine empirically rather then risking or undertaking an in person visit at this moment.   I discussed the assessment and treatment plan with the patient. The patient was provided an opportunity to ask questions and all were answered. The patient agreed with the plan and demonstrated an understanding of the instructions.   The patient was  advised to call back or seek an in-person evaluation if the symptoms worsen or if the condition fails to improve as anticipated.   Mable Paris, FNP

## 2020-08-06 NOTE — Assessment & Plan Note (Signed)
Discussed low GI diet and referral to nutrition. Once patient confirms that maternal uncle DID NOT have thyroid cancer; she knows he had esophageal cancer, we will start victoza. Likely dc glipizide in the future. Alternatively we will start jardiance if we cannot start victoza.

## 2020-08-06 NOTE — Assessment & Plan Note (Signed)
Presume improved however patient hasnt checked blood pressure. She will call with readings.

## 2020-08-06 NOTE — Assessment & Plan Note (Signed)
Slight improved, patient declines making any further medication changes. She will continue celexa.

## 2020-08-06 NOTE — Patient Instructions (Addendum)
Please discuss with your aunt if your uncles had thyroid cancer. If any thyroid cancer in the family, we cannot start victoza. Call me to let know.   It is imperative that you are seen AT least twice per year for labs and monitoring. Monitor blood pressure at home and me 5-6 reading on separate days. Goal is less than 120/80, based on newest guidelines, however we certainly want to be less than 130/80;  if persistently higher, please make sooner follow up appointment so we can recheck you blood pressure and manage/ adjust medications.  This is  Dr. Melina Schools  example of a  "Low GI"  Diet:  It will allow you to lose 4 to 8  lbs  per month if you follow it carefully.  Your goal with exercise is a minimum of 30 minutes of aerobic exercise 5 days per week (Walking does not count once it becomes easy!)    All of the foods can be found at grocery stores and in bulk at [employer].  The Atkins protein bars and shakes are available in more varieties at Target, WalMart and [employer]'s Foods.     7 AM Breakfast:  Choose from the following:  Low carbohydrate Protein  Shakes (I recommend the  Premier Protein chocolate shakes,  EAS AdvantEdge "Carb Control" shakes  Or the Atkins shakes all are under 3 net carbs)     a scrambled egg/bacon/cheese burrito made with Mission's "carb balance" whole wheat tortilla  (about 10 net carbs )  Medical laboratory scientific officer (basically a quiche without the pastry crust) that is eaten cold and very convenient way to get your eggs.  8 carbs)  If you make your own protein shakes, avoid bananas and pineapple,  And use low carb greek yogurt or original /unsweetened almond or soy milk    Avoid cereal and bananas, oatmeal and cream of wheat and grits. They are loaded with carbohydrates!   10 AM: high protein snack:  Protein bar by Atkins (the snack size, under 200 cal, usually < 6 net carbs).    A stick of cheese:  Around 1 carb,  100 cal     Dannon Light n Fit [place] Yogurt   (80 cal, 8 carbs)  Other so called "protein bars" and Greek yogurts tend to be loaded with carbohydrates.  Remember, in food advertising, the word "energy" is synonymous for " carbohydrate."  Lunch:   A Sandwich using the bread choices listed, Can use any  Eggs,  lunchmeat, grilled meat or canned tuna), avocado, regular mayo/mustard  and cheese.  A Salad using blue cheese, ranch,  Goddess or vinagrette,  Avoid taco shells, croutons or "confetti" and no "candied nuts" but regular nuts OK.   No pretzels, nabs  or chips.  Pickles and miniature sweet peppers are a good low carb alternative that provide a "crunch"  The bread is the only source of carbohydrate in a sandwich and  can be decreased by trying some of the attached alternatives to traditional loaf bread   Avoid "Low fat dressings, as well as Reyne Dumas and [employer] Foods dressings They are loaded with sugar!   3 PM/ Mid day  Snack:  Consider  1 ounce of  almonds, walnuts, pistachios, pecans, peanuts,  Macadamia nuts or a nut medley.  Avoid "granola and granola bars "  Mixed nuts are ok in moderation as long as there are no raisins,  cranberries or dried fruit.   KIND bars are OK if  you get the low glycemic index variety   Try the prosciutto/mozzarella cheese sticks by Fiorruci  In deli /backery section   High protein      6 PM  Dinner:     Meat/fowl/fish with a green salad, and either broccoli, cauliflower, green beans, spinach, brussel sprouts or  Lima beans. DO NOT BREAD THE PROTEIN!!      There is a low carb pasta by Dreamfield's that is acceptable and tastes great: only 5 digestible carbs/serving.( All grocery stores but BJs carry it ) Several ready made meals are available low carb:   Try Michel Angelo's chicken piccata or chicken or eggplant parm over low carb pasta.(Lowes and BJs)   Clifton Custard Sanchez's "Carnitas" (pulled pork, no sauce,  0 carbs) or his beef pot roast to make a dinner burrito (at BJ's)  Pesto over low carb pasta  (bj's sells a good quality pesto in the center refrigerated section of the deli   Try satueeing  [name] with mushroooms as a good side   [employer] Giant makes a mashed cauliflower that tastes like mashed potatoes  Whole wheat pasta is still full of digestible carbs and  Not as low in glycemic index as Dreamfield's.   Brown rice is still rice,  So skip the rice and noodles if you eat [place] or [place] (or at least limit to 1/2 cup)  9 PM snack :   Breyer's "low carb" fudgsicle or  ice cream bar (Carb Smart line), or  Weight Watcher's ice cream bar , or another "no sugar added" ice cream;  a serving of fresh berries/cherries with whipped cream   Cheese or DANNON'S LlGHT N FIT GREEK YOGURT  8 ounces of [place] unsweetened almond/cococunut milk    Treat yourself to a parfait made with whipped cream blueberiies, walnuts and vanilla greek yogurt  Avoid bananas, pineapple, grapes  and watermelon on a regular basis because they are high in sugar.  THINK OF THEM AS DESSERT  Remember that snack Substitutions should be less than 10 NET carbs per serving and meals < 20 carbs. Remember to subtract fiber grams to get the "net carbs."  @TULLOBREADPACKAGE @

## 2020-08-08 ENCOUNTER — Other Ambulatory Visit: Payer: Self-pay

## 2020-08-08 ENCOUNTER — Telehealth: Payer: Self-pay

## 2020-08-08 ENCOUNTER — Ambulatory Visit (INDEPENDENT_AMBULATORY_CARE_PROVIDER_SITE_OTHER): Payer: 59

## 2020-08-08 DIAGNOSIS — Z23 Encounter for immunization: Secondary | ICD-10-CM

## 2020-08-08 NOTE — Telephone Encounter (Signed)
I spoke with patient & she is aware that PA for Victoza was approved. She is also scheduled for [place] for Victoza teaching.

## 2020-08-08 NOTE — Telephone Encounter (Signed)
Patient PA approved for Victoza 08/07/20-08/07/21.

## 2020-08-12 ENCOUNTER — Other Ambulatory Visit: Payer: Self-pay | Admitting: Family

## 2020-08-12 ENCOUNTER — Telehealth: Payer: Self-pay | Admitting: Family

## 2020-08-12 MED ORDER — LISINOPRIL 5 MG PO TABS: 5.0000 mg | ORAL_TABLET | Freq: Every day | ORAL | 1 refills | Status: DC

## 2020-08-12 NOTE — Telephone Encounter (Signed)
Pt said pharmacy told her that she needs a [facility]

## 2020-08-14 ENCOUNTER — Other Ambulatory Visit: Payer: Self-pay

## 2020-08-14 ENCOUNTER — Ambulatory Visit (INDEPENDENT_AMBULATORY_CARE_PROVIDER_SITE_OTHER): Payer: 59

## 2020-08-14 VITALS — Temp 97.7°F

## 2020-08-14 DIAGNOSIS — E119 Type 2 diabetes mellitus without complications: Secondary | ICD-10-CM

## 2020-08-14 MED ORDER — PEN NEEDLES 32G X 4 MM MISC: 1 refills | Status: AC

## 2020-08-14 NOTE — Progress Notes (Signed)
Patient came in today for Victoza teaching. I demonstrated how to dial prefilled pen to correct dosing, how to apply pen needles to pen, how to give SQ injection & appropriate injection sites. I made patient aware that she must clean skin with alcohol swab & let dry before injecting medication.  I then advised patient on how to dispose of pen needles. Pt knows not to leave pen in hot car & can be room temp only after opening. Store in refrigerator until ready to use. Pt was able to demonstrate teaching back to me & verbalized understanding. Pt was advised to call if she had any questions at all regarding medication.

## 2020-08-15 ENCOUNTER — Telehealth: Payer: Self-pay | Admitting: Family

## 2020-08-15 NOTE — Telephone Encounter (Signed)
I called patient & she was having trouble initially with pen needles. She didn't think that she could get the top off needles. I explained that there was a top then a small covering for the needles itself. She stated that she had figured that out & thought that she gave medication correctly. Pen went back to zero & did not feel any insulin leak from skin. Pt will call back if any further questions.

## 2020-08-15 NOTE — Telephone Encounter (Signed)
Patient called and needs a call from the office about her liraglutide (VICTOZA) 18 MG/3ML SOPN. She is having a problem getting the lid off.

## 2020-08-18 ENCOUNTER — Telehealth: Payer: Self-pay | Admitting: Family

## 2020-08-18 DIAGNOSIS — I10 Essential (primary) hypertension: Secondary | ICD-10-CM

## 2020-08-18 MED ORDER — AMLODIPINE BESYLATE 2.5 MG PO TABS: 2.5000 mg | ORAL_TABLET | Freq: Every day | ORAL | 3 refills | Status: DC

## 2020-08-18 NOTE — Telephone Encounter (Signed)
Pt needs a refill on amLODipine (NORVASC) 2.5 MG tablet. Pt is out of medication. Please send asap

## 2020-08-22 ENCOUNTER — Telehealth: Payer: Self-pay | Admitting: Family

## 2020-08-22 NOTE — Telephone Encounter (Signed)
I spoke with patient & she stated that she realized today that there was a small cap underneath the larger cap on the pen needles. She hasn't been getting the Victoza injected into her skin at all. She thought that the needles was somehow coming through the cap. I advised patient to start over with 0.6mg  dose & work her way back up. Patient will call if any further questions.

## 2020-08-22 NOTE — Telephone Encounter (Signed)
Pt would like for you to call her about taking the Victoza medication. She said it is a question about the needle and she thinks she has been taking it wrong.

## 2020-09-11 ENCOUNTER — Ambulatory Visit: Payer: 59 | Admitting: Dietician

## 2020-09-26 ENCOUNTER — Ambulatory Visit: Payer: 59 | Admitting: Dietician

## 2020-10-02 ENCOUNTER — Encounter: Payer: Self-pay | Admitting: Gastroenterology

## 2020-10-02 ENCOUNTER — Ambulatory Visit (INDEPENDENT_AMBULATORY_CARE_PROVIDER_SITE_OTHER): Payer: 59 | Admitting: Gastroenterology

## 2020-10-02 ENCOUNTER — Other Ambulatory Visit: Payer: Self-pay

## 2020-10-02 VITALS — BP 126/80 | HR 89 | Temp 98.3°F | Ht 72.0 in | Wt 236.1 lb

## 2020-10-02 DIAGNOSIS — Z800 Family history of malignant neoplasm of digestive organs: Secondary | ICD-10-CM | POA: Diagnosis not present

## 2020-10-02 DIAGNOSIS — R1013 Epigastric pain: Secondary | ICD-10-CM

## 2020-10-02 NOTE — Progress Notes (Signed)
Cephas Darby, MD [address]  [place]  [place], [place] [address]  Main: [phone]  Fax: [phone]    Gastroenterology Consultation  Referring Provider:     Burnard Hawthorne, FNP Primary Care Physician:  Burnard Hawthorne, FNP Primary Gastroenterologist:  Dr. Cephas Darby Reason for Consultation:     Dyspepsia, colon cancer screening        HPI:   Danielle Dickerson is a 64 y.o. female referred by Dr. Burnard Hawthorne, FNP  for consultation & management of dyspepsia, colon cancer screening.  Patient reports more than 1 month history of postprandial nausea associated with early satiety, upper abdominal discomfort.  She has history of diabetes, most recent hemoglobin A1c is 8.4, on insulin, started on Victoza about a month ago.  She has moved from [place] to [place] last year.  She has 2 daughters who live here.  She is says it has been stressful in last few months, not able to concentrate on healthy foods.  Her blood sugars are improving, she is working on [employer].  She reports her weaknesses carbs.  Her weight has been stable, worried that she is not losing weight.  Normal TSH, CBC, CMP are normal  She was experiencing difficulty swallowing few years ago, she was told that she has eosinophilic esophagitis based on the EGD several years ago when she was in [place], she had a stricture and it was stretched based on her report.  Since then, she was started on Protonix 40 mg daily.  She denies any lower GI symptoms She is also due for colonoscopy, waiting for her Medicaid to become effective She does not smoke or drink alcohol  NSAIDs: None  Antiplts/Anticoagulants/Anti thrombotics: None  GI Procedures: Colonoscopy 5 years ago, reportedly normal Father with colon cancer at age 10  Past Medical History:  Diagnosis Date  . Depression   . Diabetes mellitus without complication ([place])   . Eosinophilic esophagitis   . Esophageal stricture 2019  . Hyperlipidemia      Past Surgical History:  Procedure Laterality Date  . ABDOMINAL HYSTERECTOMY  2006   has ovaries. Had heavy periods and was anemic. HAS cervix  . BREAST EXCISIONAL BIOPSY Right 2009   phyllodes removed   . CHOLECYSTECTOMY OPEN      Current Outpatient Medications:  .  amLODipine (NORVASC) 2.5 MG tablet, Take 1 tablet (2.5 mg total) by mouth daily., Disp: 90 tablet, Rfl: 3 .  aspirin EC 81 MG tablet, Take 81 mg by mouth daily., Disp: , Rfl:  .  atorvastatin (LIPITOR) 40 MG tablet, Take 40 mg by mouth daily., Disp: , Rfl:  .  Blood Glucose Monitoring Suppl (ONETOUCH VERIO) w/Device KIT, 1 Device by Does not apply route daily as needed., Disp: 1 kit, Rfl: 0 .  Calcium-Vitamin D-Vitamin K (CALCIUM SOFT CHEWS PO), Take 1 tablet by mouth daily., Disp: , Rfl:  .  Cholecalciferol (VITAMIN D3) 50 MCG (2000 UT) capsule, Take 2,000 Units by mouth daily., Disp: , Rfl:  .  citalopram (CELEXA) 20 MG tablet, TAKE 1 TABLET BY MOUTH EVERY DAY, Disp: 90 tablet, Rfl: 2 .  glipiZIDE (GLUCOTROL XL) 10 MG 24 hr tablet, Take 1 tablet (10 mg total) by mouth daily with breakfast., Disp: 90 tablet, Rfl: 1 .  glucose blood (ONETOUCH VERIO) test strip, Used to check blood sugars once a day., Disp: 100 each, Rfl: 4 .  Insulin Pen Needle (PEN NEEDLES) 32G X 4 MM MISC, Used to give  Victoza injections., Disp: 100 each, Rfl: 1 .  Lancets (ONETOUCH ULTRASOFT) lancets, Used to check blood sugars once a day., Disp: 100 each, Rfl: 4 .  liraglutide (VICTOZA) 18 MG/3ML SOPN, Start 0.6 mg [place] qd x 1 week, then 1.2 mg [place] qd thereafter, Disp: 6 mL, Rfl: 12 .  lisinopril (ZESTRIL) 5 MG tablet, Take 1 tablet (5 mg total) by mouth daily., Disp: 90 tablet, Rfl: 1 .  metFORMIN (GLUCOPHAGE) 1000 MG tablet, TAKE 1 TABLET (1,000 MG TOTAL) BY MOUTH 2 (TWO) TIMES DAILY WITH A MEAL., Disp: 180 tablet, Rfl: 1 .  Multiple Vitamins-Minerals (CENTRUM SILVER ADULT 50+ PO), Take 1 tablet by mouth daily., Disp: , Rfl:  .  pantoprazole (PROTONIX)  40 MG tablet, TAKE 1 TABLET BY MOUTH EVERY DAY (QTY LIMIT- PA REQUIRED), Disp: 90 tablet, Rfl: 1   Family History  Problem Relation Age of Onset  . Congestive Heart Failure Mother   . Colon cancer Father   . Esophageal cancer Maternal Uncle   . Thyroid cancer Neg Hx      Social History   Tobacco Use  . Smoking status: Never Smoker  . Smokeless tobacco: Never Used  Vaping Use  . Vaping Use: Never used  Substance Use Topics  . Alcohol use: Not Currently  . Drug use: Never    Allergies as of 10/02/2020  . (No Known Allergies)    Review of Systems:    All systems reviewed and negative except where noted in HPI.   Physical Exam:  BP 126/80 (BP Location: Left Arm, Patient Position: Sitting, Cuff Size: Normal)   Pulse 89   Temp 98.3 F (36.8 C) (Oral)   Ht 6' (1.829 m)   Wt 236 lb 2 oz (107.1 kg)   BMI 32.02 kg/m  No LMP recorded. Patient has had a hysterectomy.  General:   Alert,  Well-developed, well-nourished, pleasant and cooperative in NAD Head:  Normocephalic and atraumatic. Eyes:  Sclera clear, no icterus.   Conjunctiva pink. Ears:  Normal auditory acuity. Nose:  No deformity, discharge, or lesions. Mouth:  No deformity or lesions,oropharynx pink & moist. Neck:  Supple; no masses or thyromegaly. Lungs:  Respirations even and unlabored.  Clear throughout to auscultation.   No wheezes, crackles, or rhonchi. No acute distress. Heart:  Regular rate and rhythm; no murmurs, clicks, rubs, or gallops. Abdomen:  Normal bowel sounds. Soft, obese, non-tender and non-distended without masses, hepatosplenomegaly or hernias noted.  No guarding or rebound tenderness.   Rectal: Not performed Msk:  Symmetrical without gross deformities. Good, equal movement & strength bilaterally. Pulses:  Normal pulses noted. Extremities:  No clubbing or edema.  No cyanosis. Neurologic:  Alert and oriented x3;  grossly normal neurologically. Skin:  Intact without significant lesions or rashes.  No jaundice. Psych:  Alert and cooperative. Normal mood and affect.  Imaging Studies: No abdominal imaging  Assessment and Plan:   Danielle Dickerson is a 64 y.o. pleasant Caucasian female with metabolic syndrome, family history of colon cancer in father at age 35, ?  Eosinophilic esophagitis, esophageal stricture s/p dilation in the past for dysphagia is seen in consultation for more than 1 month history of dyspepsia  Dyspepsia: Suspect if she has diabetic gastroparesis Increase Protonix to 40 mg p.o. twice daily for 1 month Discussed about tight control of diabetes Also, recommend EGD with esophageal and gastric biopsies  Family history of colon cancer in first-degree relative Recommend surveillance colonoscopy Patient prefers to schedule procedures after January 2022.  She will call our office and schedule   Follow up in 3 months   Cephas Darby, MD

## 2020-10-02 NOTE — Patient Instructions (Addendum)
Call [facility] [phone] when you are ready to scheduled colonoscopy The diagnoses code for colonoscopy would be family history of colon cancer Z80.0 CPT [phone]. EGD Dyspepsia R10.13 CPT 43235

## 2020-10-06 ENCOUNTER — Telehealth: Payer: Self-pay | Admitting: Family

## 2020-10-06 MED ORDER — PANTOPRAZOLE SODIUM 40 MG PO TBEC: DELAYED_RELEASE_TABLET | ORAL | 1 refills | Status: DC

## 2020-10-06 NOTE — Telephone Encounter (Signed)
Pt needs a refill on pantoprazole (PROTONIX) 40 MG tablet for [facility]

## 2020-10-07 ENCOUNTER — Telehealth: Payer: Self-pay | Admitting: Family

## 2020-10-07 MED ORDER — PANTOPRAZOLE SODIUM 40 MG PO TBEC: DELAYED_RELEASE_TABLET | ORAL | 1 refills | Status: DC

## 2020-10-07 NOTE — Telephone Encounter (Signed)
Refill has been sent.  °

## 2020-10-07 NOTE — Telephone Encounter (Signed)
[facility] that [facility] not [facility]. Please resend

## 2020-10-07 NOTE — Telephone Encounter (Signed)
I able to get patient's protonix approved for one year 10/07/20-10/07/21 through [place] & [facility] is aware that she can know pick up medication.

## 2020-10-07 NOTE — Addendum Note (Signed)
Addended by: Elise Benne T on: 10/07/2020 03:06 PM   Modules accepted: Orders

## 2020-10-07 NOTE — Telephone Encounter (Signed)
[facility]'s medication would not give me a number for you call back he did not have a direct line.

## 2020-10-07 NOTE — Telephone Encounter (Signed)
[facility] to see why [facility]ix cannot be filled. I was advised that it is because patient can one refill a year of medication covered then after this is out of pocket. I also tried submitted a PA through cover my meds, but was given a block that states that member not eligible. I called to let patient know & she stated that she would try calling [employer]urnace herself. I asked that she just let us know what I can do on my end to get covered for her. She will call back.

## 2020-10-07 NOTE — Telephone Encounter (Signed)
Pharmacy said patient can not get another 90 day supply until Augest 2022, per patient's insurance. Patient needs a prior authorization and she is currently out of medication.

## 2020-10-13 ENCOUNTER — Encounter: Payer: Self-pay | Admitting: Dietician

## 2020-10-13 ENCOUNTER — Encounter: Payer: 59 | Attending: Family | Admitting: Dietician

## 2020-10-13 ENCOUNTER — Other Ambulatory Visit: Payer: Self-pay

## 2020-10-13 VITALS — Ht 72.0 in | Wt 235.9 lb

## 2020-10-13 DIAGNOSIS — E119 Type 2 diabetes mellitus without complications: Secondary | ICD-10-CM | POA: Diagnosis present

## 2020-10-13 NOTE — Progress Notes (Signed)
Medical Nutrition Therapy: Visit start time: 1100  end time: 1215  Assessment:  Diagnosis: type 2 diabetes Past medical history: HTN, diverticulosis, GERD Psychosocial issues/ stress concerns: pt rates stress level as "high to moderate" and feels "ok" about stress management skills  Preferred learning method:  . Auditory . Visual . Hands-on  Current weight: 235.9 lbs  Height: 6' Medications, supplements: reconciled in medical record  Lab 7.6 (H) on 02/27/2020; 8.4(H) on 07/22/2020  Progress and evaluation:   Pt reports she and her husband recently moved to [place] from [place] and have not yet been able to settle into a routine  Watching grand kids, renovating areas of home, helping daughter move has all meant relying more on take out and fast food for meals, sometimes skipping lunch   Pt reports she started taking liraglutide in September and has noticed side effects- nausea and virtigo  Pt reports she does not tolerate most meat well; main protein sources are chicken, fish, cheese, nuts, peanut butter, and occasionally ground beef    Pt reports when she lived in [place] her favorite form of exercise was biking for 20-30 miles at a time but has not been able to find easily accessible paved bike trails in the area  Pt states she was diagnosed with T2DM 10+ years ago, not currently checking BGs  Physical activity: walking 30 min, 7 days/week; stationary bike 30 min, 3 days/week  Dietary Intake:  Usual eating pattern includes 2-3 meals and 1-2 snacks per day. Dining out frequency: 6 meals per week.  Breakfast: oatmeal sometimes with banana  Snack: same as below Lunch: usually skips; banana or other fruit; take out-sandwich with fries; bagel; PB&J sandwich   Snack: handful of almonds; cheese Supper: cottage cheese with croissant; 1/2 asiago cheese bagel; cheerios with milk; scrambled eggs with cheese   Snack: spoonful of PB Beverages: 16-24 oz water, 1 glass of root beer (when dining  out for lunch or dinner)  Nutrition Care Education: Basic nutrition: basic food groups, appropriate nutrient balance, appropriate meal and snack schedule, general nutrition guidelines    Weight control: importance of low sugar and low fat choices, portion control strategies Advanced nutrition: food label reading Diabetes:  goals for BGs, appropriate meal and snack schedule, appropriate carb intake and balance, healthy carb choices, role of fiber, protein, fat Hypertension: identifying high sodium foods Heart health:  healthy and unhealthy fats, role of fiber, plant sterols, role of exercise  Nutritional Diagnosis:  [place]-2.2 Altered nutrition-related laboratory As related to type 2 diabetes.  As evidenced by HgA1c of 8.4(H) on 07/22/2020.  Intervention:  Discussion and instruction as noted above.  Additionally, pt seemed discouraged by increasing HgA1c and motivated to make improvements.  Focused on increasing quality protein sources (greek yogurt, cheese, and beans) and adequate vegetable intake in her diet.  Encouraged pt to maintain great physical activity for continued diabetes management.  Established goals for additional positive change.    Increase fruit and vegetable intake   Incorporate vegetables at lunch and dinner   Incorporate more F/V at snacks  Do not skip lunch, try having something small (carb+protein)  Try frozen veggies that come in microwaveable pouches (may be tender than fresh and low prep time)  Try canned fruit NOT in heavy syrup (mandarin oranges, peaches, pears, applesauce) for snacks + protein (cheese, PB)  Smoothies may be easier to consume when appetite is low    Maintain physical activity   150 minutes per week is recommendation  Increase fiber intake   Eat at least 3 servings of whole grains a day  Increase fruit and vegetable intake  Increase fluid intake   Drink at least 64oz water daily   Decrease sodium intake   Recommended 1500mg /day    Read nutrition labels on packaged foods to monitor sodium intake   400-600 mg/meal  <200 mg/snack   Education Materials given:  . General diet guidelines for Diabetes . Carb-Mindful Smoothie handout  . Plate Planner with food lists  . Goals/ instructions  Learner/ who was taught:  . Patient   Level of understanding: Verbalizes/ demonstrates competency  Demonstrated degree of understanding via:   Teach back Learning barriers: . None  Willingness to learn/ readiness for change: . Eager, change in progress  Monitoring and Evaluation:  Dietary intake, exercise, A1c, and body weight      follow up: January 10th at 10am

## 2020-10-13 NOTE — Patient Instructions (Signed)
   Make sure your meals and snacks have a protein source   Mindful of portions of carb-rich foods   2-3 servings at meals   1-2 servings at snacks   Try to have a smoothie a couple of times a week

## 2020-11-11 ENCOUNTER — Other Ambulatory Visit: Payer: Self-pay

## 2020-11-11 ENCOUNTER — Encounter: Payer: Self-pay | Admitting: Family

## 2020-11-11 ENCOUNTER — Ambulatory Visit (INDEPENDENT_AMBULATORY_CARE_PROVIDER_SITE_OTHER): Payer: 59 | Admitting: Family

## 2020-11-11 ENCOUNTER — Other Ambulatory Visit: Payer: Self-pay | Admitting: Family

## 2020-11-11 VITALS — BP 132/78 | HR 81 | Temp 98.6°F | Ht 72.0 in | Wt 232.8 lb

## 2020-11-11 DIAGNOSIS — E119 Type 2 diabetes mellitus without complications: Secondary | ICD-10-CM | POA: Diagnosis not present

## 2020-11-11 DIAGNOSIS — E785 Hyperlipidemia, unspecified: Secondary | ICD-10-CM | POA: Diagnosis not present

## 2020-11-11 DIAGNOSIS — I10 Essential (primary) hypertension: Secondary | ICD-10-CM

## 2020-11-11 LAB — POCT GLYCOSYLATED HEMOGLOBIN (HGB A1C): Hemoglobin A1C: 6.3 % — AB (ref 4.0–5.6)

## 2020-11-11 NOTE — Assessment & Plan Note (Signed)
Stable. Continue amlodipine 2.5mg.  

## 2020-11-11 NOTE — Progress Notes (Signed)
Subjective:    Patient ID: Danielle Dickerson, female    DOB: 1956-07-11, 64 y.o.   MRN: [id number]  CC: Danielle Dickerson is a 64 y.o. female who presents today for follow up.   HPI: Feels well today No complaints  HTN- compliant amlodipine 2.4m. no cp, sob.   DM- working on diet and has seen nutritionist.  compliant with glipizide 144m, victoza 1.52m34mmetformin 1000m67md  HLD - compliant with lipitor 40mg20m  Consulted with Dr vangaMarius Ditch month for dyspepsia. Advised EGD and colonoscopy, patient will call to schedule at her office.   HISTORY:  Past Medical History:  Diagnosis Date  . Depression   . Diabetes mellitus without complication (HCC) [place] Eosinophilic esophagitis   . Esophageal stricture 2019  . Hyperlipidemia    Past Surgical History:  Procedure Laterality Date  . ABDOMINAL HYSTERECTOMY  2006   has ovaries. Had heavy periods and was anemic. HAS cervix  . BREAST EXCISIONAL BIOPSY Right 2009   phyllodes removed   . CHOLECYSTECTOMY OPEN     Family History  Problem Relation Age of Onset  . Congestive Heart Failure Mother   . Colon cancer Father   . Esophageal cancer Maternal Uncle   . Thyroid cancer Neg Hx     Allergies: Patient has no known allergies. Current Outpatient Medications on File Prior to Visit  Medication Sig Dispense Refill  . amLODipine (NORVASC) 2.5 MG tablet Take 1 tablet (2.5 mg total) by mouth daily. 90 tablet 3  . aspirin EC 81 MG tablet Take 81 mg by mouth daily.    . atoMarland Kitchenvastatin (LIPITOR) 40 MG tablet Take 40 mg by mouth daily.    . Blood Glucose Monitoring Suppl (ONETOUCH VERIO) w/Device KIT 1 Device by Does not apply route daily as needed. 1 kit 0  . Calcium-Vitamin D-Vitamin K (CALCIUM SOFT CHEWS PO) Take 1 tablet by mouth daily.    . citalopram (CELEXA) 20 MG tablet TAKE 1 TABLET BY MOUTH EVERY DAY 90 tablet 2  . glucose blood (ONETOUCH VERIO) test strip Used to check blood sugars once a day. 100 each 4  . Insulin Pen Needle (PEN  NEEDLES) 32G X 4 MM MISC Used to give Victoza injections. 100 each 1  . Lancets (ONETOUCH ULTRASOFT) lancets Used to check blood sugars once a day. 100 each 4  . liraglutide (VICTOZA) 18 MG/3ML SOPN Start 0.6 mg Danielle qd x 1 week, then 1.2 mg Dickerson qd thereafter 6 mL 12  . lisinopril (ZESTRIL) 5 MG tablet Take 1 tablet (5 mg total) by mouth daily. 90 tablet 1  . Multiple Vitamins-Minerals (CENTRUM SILVER ADULT 50+ PO) Take 1 tablet by mouth daily.    . pantoprazole (PROTONIX) 40 MG tablet TAKE 1 TABLET BY MOUTH EVERY DAY 30 tablet 1   No current facility-administered medications on file prior to visit.    Social History   Tobacco Use  . Smoking status: Never Smoker  . Smokeless tobacco: Never Used  Vaping Use  . Vaping Use: Never used  Substance Use Topics  . Alcohol use: Not Currently  . Drug use: Never    Review of Systems  Constitutional: Negative for chills and fever.  Respiratory: Negative for cough.   Cardiovascular: Negative for chest pain and palpitations.  Gastrointestinal: Negative for nausea and vomiting.      Objective:    BP 132/78   Pulse 81   Temp 98.6 F (37 C)   Ht 6' (  1.829 m)   Wt 232 lb 12.8 oz (105.6 kg)   SpO2 97%   BMI 31.57 kg/m  BP Readings from Last 3 Encounters:  11/11/20 132/78  10/02/20 126/80  05/08/20 133/83   Wt Readings from Last 3 Encounters:  11/11/20 232 lb 12.8 oz (105.6 kg)  10/13/20 235 lb 14.4 oz (107 kg)  10/02/20 236 lb 2 oz (107.1 kg)    Physical Exam Vitals reviewed.  Constitutional:      Appearance: She is well-developed and well-nourished.  Eyes:     Conjunctiva/sclera: Conjunctivae normal.  Cardiovascular:     Rate and Rhythm: Normal rate and regular rhythm.     Pulses: Normal pulses.     Heart sounds: Normal heart sounds.  Pulmonary:     Effort: Pulmonary effort is normal.     Breath sounds: Normal breath sounds. No wheezing, rhonchi or rales.  Skin:    General: Skin is warm and dry.  Neurological:     Mental  Status: She is alert.  Psychiatric:        Mood and Affect: Mood and affect normal.        Speech: Speech normal.        Behavior: Behavior normal.        Thought Content: Thought content normal.        Assessment & Plan:   Problem List Items Addressed This Visit      Cardiovascular and Mediastinum   HTN (hypertension)    Stable. Continue amlodipine 2.80m        Endocrine   DM (diabetes mellitus) ([place]    Lab Results  Component Value Date   HGBA1C 6.3 (A) 11/11/2020   Very well improved. Continue  glipizide 133m, victoza 1.54m39mmetformin 1000m47md for now however likely will dc glipizide at follow up.       Relevant Orders   Ambulatory referral to Ophthalmology   POCT HgB A1C (Completed)     Other   HLD (hyperlipidemia)    Stable. Continue lipitor 40mg29m      I have discontinued LauriLamar Naefmin D3. I am also having her maintain her atorvastatin, Multiple Vitamins-Minerals (CENTRUM SILVER ADULT 50+ PO), Calcium-Vitamin D-Vitamin K (CALCIUM SOFT CHEWS PO), aspirin EC, OneTouch Verio, OneTouch Verio, onetouch ultrasoft, citalopram, liraglutide, lisinopril, Pen Needles, amLODipine, pantoprazole, metFORMIN, and glipiZIDE.   No orders of the defined types were placed in this encounter.   Return precautions given.   Risks, benefits, and alternatives of the medications and treatment plan prescribed today were discussed, and patient expressed understanding.   Education regarding symptom management and diagnosis given to patient on AVS.  Continue to follow with ArnetBurnard Hawthorne for routine health maintenance.   LauriShon MilletI agreed with plan.   MargaMable Paris

## 2020-11-11 NOTE — Assessment & Plan Note (Signed)
Stable. Continue lipitor 40 mg 

## 2020-11-11 NOTE — Patient Instructions (Signed)
Referral for eye exam Let us know if you dont hear back within a week in regards to an appointment being scheduled.    Nice to see you!

## 2020-11-11 NOTE — Assessment & Plan Note (Signed)
Lab Results  Component Value Date   HGBA1C 6.3 (A) 11/11/2020   Very well improved. Continue  glipizide 10mg  , victoza 1.2mg  ,metformin 1000mg  bid for now however likely will dc glipizide at follow up.

## 2020-11-11 NOTE — Progress Notes (Signed)
I called patient & she will d/c glipizide. I will take off chart. She will have checked at appointment 02/09/21.

## 2020-11-27 ENCOUNTER — Telehealth: Payer: Self-pay | Admitting: Family

## 2020-11-27 NOTE — Telephone Encounter (Signed)
Rejection Reason - Patient did not respond" [facility] said on Nov 17, 2020 3:02 PM  "LM X2" [facility] said on Nov 17, 2020 10:42 AM  "lm" [facility] said on Nov 11, 2020 3:06 PM

## 2020-12-08 ENCOUNTER — Ambulatory Visit: Payer: 59 | Admitting: Dietician

## 2021-01-02 ENCOUNTER — Other Ambulatory Visit: Payer: Self-pay | Admitting: Family

## 2021-01-05 ENCOUNTER — Other Ambulatory Visit: Payer: Self-pay

## 2021-01-07 ENCOUNTER — Other Ambulatory Visit: Payer: Self-pay

## 2021-01-07 ENCOUNTER — Other Ambulatory Visit: Payer: Self-pay | Admitting: Gastroenterology

## 2021-01-07 ENCOUNTER — Encounter: Payer: Self-pay | Admitting: Gastroenterology

## 2021-01-07 ENCOUNTER — Ambulatory Visit (INDEPENDENT_AMBULATORY_CARE_PROVIDER_SITE_OTHER): Payer: 59 | Admitting: Gastroenterology

## 2021-01-07 VITALS — BP 157/76 | HR 80 | Temp 98.2°F | Ht 72.0 in | Wt 231.0 lb

## 2021-01-07 DIAGNOSIS — Z1211 Encounter for screening for malignant neoplasm of colon: Secondary | ICD-10-CM

## 2021-01-07 DIAGNOSIS — R1319 Other dysphagia: Secondary | ICD-10-CM

## 2021-01-07 DIAGNOSIS — K5904 Chronic idiopathic constipation: Secondary | ICD-10-CM

## 2021-01-07 DIAGNOSIS — R1013 Epigastric pain: Secondary | ICD-10-CM

## 2021-01-07 MED ORDER — NA SULFATE-K SULFATE-MG SULF 17.5-3.13-1.6 GM/177ML PO SOLN
354.0000 mL | Freq: Once | ORAL | 0 refills | Status: DC
Start: 2021-01-07 — End: 2021-01-07

## 2021-01-07 NOTE — Progress Notes (Signed)
**Note De-Identified Danielle Obfuscation** Cephas Darby, MD [address]  [place]  [place], [place] [address]  Main: [phone]  Fax: [phone]    Gastroenterology Consultation  Referring Provider:     Burnard Hawthorne, FNP Primary Care Physician:  Burnard Hawthorne, FNP Primary Gastroenterologist:  Dr. Cephas Darby Reason for Consultation:     Dyspepsia, colon cancer screening        HPI:   Danielle Dickerson is a 65 y.o. female referred by Dr. Burnard Hawthorne, FNP  for consultation & management of dyspepsia, colon cancer screening.  Patient reports more than 1 month history of postprandial nausea associated with early satiety, upper abdominal discomfort.  She has history of diabetes, most recent hemoglobin A1c is 8.4, on insulin, started on Victoza about a month ago.  She has moved from [place] to [place] last year.  She has 2 daughters who live here.  She is says it has been stressful in last few months, not able to concentrate on healthy foods.  Her blood sugars are improving, she is working on [employer].  She reports her weaknesses carbs.  Her weight has been stable, worried that she is not losing weight.  Normal TSH, CBC, CMP are normal  She was experiencing difficulty swallowing few years ago, she was told that she has eosinophilic esophagitis based on the EGD several years ago when she was in [place], she had a stricture and it was stretched based on her report.  Since then, she was started on Protonix 40 mg daily.  She denies any lower GI symptoms She is also due for colonoscopy, waiting for her Medicaid to become effective She does not smoke or drink alcohol  Follow up visit 01/07/21: Patient reports that her dyspepsia symptoms have resolved since stopping glipizide.  Her symptoms were attributed to Victoza.  Her hemoglobin A1c is currently down to 6.3.  She is concerned about pills getting stuck in her throat.  She does have incomplete emptying every time she has a BM associated with straining.  She  thinks her hemorrhoids are flaring up, cause itching.  She thinks she has to add more fiber in her diet.  Limited intake of water only  NSAIDs: None  Antiplts/Anticoagulants/Anti thrombotics: None  GI Procedures: Colonoscopy 5 years ago, reportedly normal Father with colon cancer at age 31  Past Medical History:  Diagnosis Date  . Depression   . Diabetes mellitus without complication ([place])   . Eosinophilic esophagitis   . Esophageal stricture 2019  . Hyperlipidemia     Past Surgical History:  Procedure Laterality Date  . ABDOMINAL HYSTERECTOMY  2006   has ovaries. Had heavy periods and was anemic. HAS cervix  . BREAST EXCISIONAL BIOPSY Right 2009   phyllodes removed   . CHOLECYSTECTOMY OPEN      Current Outpatient Medications:  .  amLODipine (NORVASC) 2.5 MG tablet, Take 1 tablet (2.5 mg total) by mouth daily., Disp: 90 tablet, Rfl: 3 .  aspirin EC 81 MG tablet, Take 81 mg by mouth daily., Disp: , Rfl:  .  atorvastatin (LIPITOR) 40 MG tablet, Take 40 mg by mouth daily., Disp: , Rfl:  .  Blood Glucose Monitoring Suppl (ONETOUCH VERIO) w/Device KIT, 1 Device by Does not apply route daily as needed., Disp: 1 kit, Rfl: 0 .  Calcium-Vitamin D-Vitamin K (CALCIUM SOFT CHEWS PO), Take 1 tablet by mouth daily., Disp: , Rfl:  .  citalopram (CELEXA) 20 MG tablet, TAKE 1 TABLET BY MOUTH EVERY DAY, Disp:  90 tablet, Rfl: 2 .  glucose blood (ONETOUCH VERIO) test strip, Used to check blood sugars once a day., Disp: 100 each, Rfl: 4 .  Insulin Pen Needle (PEN NEEDLES) 32G X 4 MM MISC, Used to give Victoza injections., Disp: 100 each, Rfl: 1 .  Lancets (ONETOUCH ULTRASOFT) lancets, Used to check blood sugars once a day., Disp: 100 each, Rfl: 4 .  liraglutide (VICTOZA) 18 MG/3ML SOPN, Start 0.6 mg [place] qd x 1 week, then 1.2 mg [place] qd thereafter, Disp: 6 mL, Rfl: 12 .  lisinopril (ZESTRIL) 5 MG tablet, TAKE 1 TABLET BY MOUTH EVERY DAY, Disp: 90 tablet, Rfl: 1 .  metFORMIN (GLUCOPHAGE) 1000 MG tablet,  TAKE 1 TABLET (1,000 MG TOTAL) BY MOUTH 2 (TWO) TIMES DAILY WITH A MEAL., Disp: 180 tablet, Rfl: 1 .  Multiple Vitamins-Minerals (CENTRUM SILVER ADULT 50+ PO), Take 1 tablet by mouth daily., Disp: , Rfl:  .  Na Sulfate-K Sulfate-Mg Sulf 17.5-3.13-1.6 GM/177ML SOLN, Take 354 mLs by mouth once for 1 dose., Disp: 354 mL, Rfl: 0 .  pantoprazole (PROTONIX) 40 MG tablet, TAKE 1 TABLET BY MOUTH EVERY DAY, Disp: 30 tablet, Rfl: 1   Family History  Problem Relation Age of Onset  . Congestive Heart Failure Mother   . Colon cancer Father   . Esophageal cancer Maternal Uncle   . Thyroid cancer Neg Hx      Social History   Tobacco Use  . Smoking status: Never Smoker  . Smokeless tobacco: Never Used  Vaping Use  . Vaping Use: Never used  Substance Use Topics  . Alcohol use: Not Currently  . Drug use: Never    Allergies as of 01/07/2021  . (No Known Allergies)    Review of Systems:    All systems reviewed and negative except where noted in HPI.   Physical Exam:  BP (!) 157/76 (BP Location: Left Arm, Patient Position: Sitting, Cuff Size: Normal)   Pulse 80   Temp 98.2 F (36.8 C) (Oral)   Ht 6' (1.829 m)   Wt 231 lb (104.8 kg)   BMI 31.33 kg/m  No LMP recorded. Patient has had a hysterectomy.  General:   Alert,  Well-developed, well-nourished, pleasant and cooperative in NAD Head:  Normocephalic and atraumatic. Eyes:  Sclera clear, no icterus.   Conjunctiva pink. Ears:  Normal auditory acuity. Nose:  No deformity, discharge, or lesions. Mouth:  No deformity or lesions,oropharynx pink & moist. Neck:  Supple; no masses or thyromegaly. Lungs:  Respirations even and unlabored.  Clear throughout to auscultation.   No wheezes, crackles, or rhonchi. No acute distress. Heart:  Regular rate and rhythm; no murmurs, clicks, rubs, or gallops. Abdomen:  Normal bowel sounds. Soft, obese, non-tender and non-distended without masses, hepatosplenomegaly or hernias noted.  No guarding or rebound  tenderness.   Rectal: Not performed Msk:  Symmetrical without gross deformities. Good, equal movement & strength bilaterally. Pulses:  Normal pulses noted. Extremities:  No clubbing or edema.  No cyanosis. Neurologic:  Alert and oriented x3;  grossly normal neurologically. Skin:  Intact without significant lesions or rashes. No jaundice. Psych:  Alert and cooperative. Normal mood and affect.  Imaging Studies: No abdominal imaging  Assessment and Plan:   Danielle Dickerson is a 65 y.o. pleasant Caucasian female with metabolic syndrome, family history of colon cancer in father at age 38, ?  Eosinophilic esophagitis, esophageal stricture s/p dilation in the past for dysphagia is seen in consultation for difficulty swallowing pills and colonoscopy  Dysphagia Recommend EGD with possible dilation, esophageal and gastric biopsies  Family history of colon cancer in first-degree relative Recommend surveillance colonoscopy  Chronic constipation Discussed about high-fiber diet, information provided Discussed about adequate intake of water Trial of MiraLAX as needed   Follow up as needed   Cephas Darby, MD

## 2021-01-07 NOTE — Patient Instructions (Signed)
High-Fiber Eating Plan Fiber, also called dietary fiber, is a type of carbohydrate. It is found foods such as fruits, vegetables, whole grains, and beans. A high-fiber diet can have many health benefits. Your health care provider may recommend a high-fiber diet to help:  Prevent constipation. Fiber can make your bowel movements more regular.  Lower your cholesterol.  Relieve the following conditions: ? Inflammation of veins in the anus (hemorrhoids). ? Inflammation of specific areas of the digestive tract (uncomplicated diverticulosis). ? A problem of the large intestine, also called the colon, that sometimes causes pain and diarrhea (irritable bowel syndrome, or IBS).  Prevent overeating as part of a weight-loss plan.  Prevent heart disease, type 2 diabetes, and certain cancers. What are tips for following this plan? Reading food labels  Check the nutrition facts label on food products for the amount of dietary fiber. Choose foods that have 5 grams of fiber or more per serving.  The goals for recommended daily fiber intake include: ? Men (age 50 or younger): 34-38 g. ? Men (over age 50): 28-34 g. ? Women (age 50 or younger): 25-28 g. ? Women (over age 50): 22-25 g. Your daily fiber goal is _____________ g.   Shopping  Choose whole fruits and vegetables instead of processed forms, such as apple juice or applesauce.  Choose a wide variety of high-fiber foods such as avocados, lentils, oats, and kidney beans.  Read the nutrition facts label of the foods you choose. Be aware of foods with added fiber. These foods often have high sugar and sodium amounts per serving. Cooking  Use whole-grain flour for baking and cooking.  Cook with brown rice instead of white rice. Meal planning  Start the day with a breakfast that is high in fiber, such as a cereal that contains 5 g of fiber or more per serving.  Eat breads and cereals that are made with whole-grain flour instead of refined  flour or white flour.  Eat brown rice, bulgur wheat, or millet instead of white rice.  Use beans in place of meat in soups, salads, and pasta dishes.  Be sure that half of the grains you eat each day are whole grains. General information  You can get the recommended daily intake of dietary fiber by: ? Eating a variety of fruits, vegetables, grains, nuts, and beans. ? Taking a fiber supplement if you are not able to take in enough fiber in your diet. It is better to get fiber through food than from a supplement.  Gradually increase how much fiber you consume. If you increase your intake of dietary fiber too quickly, you may have bloating, cramping, or gas.  Drink plenty of water to help you digest fiber.  Choose high-fiber snacks, such as berries, raw vegetables, nuts, and popcorn. What foods should I eat? Fruits Berries. Pears. Apples. Oranges. Avocado. Prunes and raisins. Dried figs. Vegetables Sweet potatoes. Spinach. Kale. Artichokes. Cabbage. Broccoli. Cauliflower. Green peas. Carrots. Squash. Grains Whole-grain breads. Multigrain cereal. Oats and oatmeal. Brown rice. Barley. Bulgur wheat. Millet. Quinoa. Bran muffins. Popcorn. Rye wafer crackers. Meats and other proteins Navy beans, kidney beans, and pinto beans. Soybeans. Split peas. Lentils. Nuts and seeds. Dairy Fiber-fortified yogurt. Beverages Fiber-fortified soy milk. Fiber-fortified orange juice. Other foods Fiber bars. The items listed above may not be a complete list of recommended foods and beverages. Contact a dietitian for more information. What foods should I avoid? Fruits Fruit juice. Cooked, strained fruit. Vegetables Fried potatoes. Canned vegetables. Well-cooked vegetables. Grains   White bread. Pasta made with refined flour. White rice. Meats and other proteins Fatty cuts of meat. Fried chicken or fried fish. Dairy Milk. Yogurt. Cream cheese. Sour cream. Fats and oils Butters. Beverages Soft  drinks. Other foods Cakes and pastries. The items listed above may not be a complete list of foods and beverages to avoid. Talk with your dietitian about what choices are best for you. Summary  Fiber is a type of carbohydrate. It is found in foods such as fruits, vegetables, whole grains, and beans.  A high-fiber diet has many benefits. It can help to prevent constipation, lower blood cholesterol, aid weight loss, and reduce your risk of heart disease, diabetes, and certain cancers.  Increase your intake of fiber gradually. Increasing fiber too quickly may cause cramping, bloating, and gas. Drink plenty of water while you increase the amount of fiber you consume.  The best sources of fiber include whole fruits and vegetables, whole grains, nuts, seeds, and beans. This information is not intended to replace advice given to you by your health care provider. Make sure you discuss any questions you have with your health care provider. Document Revised: 03/20/2020 Document Reviewed: 03/20/2020 Elsevier Patient Education  2021 Elsevier Inc.  

## 2021-01-08 ENCOUNTER — Telehealth: Payer: Self-pay

## 2021-01-08 ENCOUNTER — Other Ambulatory Visit: Payer: Self-pay | Admitting: Gastroenterology

## 2021-01-08 ENCOUNTER — Telehealth: Payer: Self-pay | Admitting: Family

## 2021-01-08 MED ORDER — ATORVASTATIN CALCIUM 40 MG PO TABS: 40.0000 mg | ORAL_TABLET | Freq: Every day | ORAL | 1 refills | Status: DC

## 2021-01-08 MED ORDER — ONDANSETRON HCL 4 MG PO TABS: 4.0000 mg | ORAL_TABLET | Freq: Four times a day (QID) | ORAL | 1 refills | Status: DC | PRN

## 2021-01-08 NOTE — Telephone Encounter (Signed)
Returned patients phone call in regards to colonoscopy questions.  She was not available.  I left voice message for her to call the office back or send the office a mychart message to have her questions addressed.  Thanks,  [place], [place]

## 2021-01-08 NOTE — Telephone Encounter (Signed)
Refill has been sent.  °

## 2021-01-08 NOTE — Telephone Encounter (Signed)
Pt needs a refill on atorvastatin (LIPITOR) 40 MG tablet sent to [facility]

## 2021-01-12 ENCOUNTER — Telehealth: Payer: Self-pay

## 2021-01-12 ENCOUNTER — Ambulatory Visit: Payer: 59 | Admitting: Dietician

## 2021-01-12 NOTE — Telephone Encounter (Signed)
Returned patients call in regards to her procedure questions/concerns.  1. R/S colonoscopy due to insurance ?March 2. R/S endoscopy to May due to insurance ?May 3. Suprep vs ClenPiq she states she got sick from one of the bowel preps.  Clenpiq is what she said is at the pharmacy.  But she said she will talk to the pharmacist about this.  Patient has been advised that I will have Ashley-Dr. Verdis Prime nurse to call her back and she can provide [facility] with additional details regarding her colonoscopy/EGD scheduling.  Thanks,  [place], [place]

## 2021-01-13 NOTE — Telephone Encounter (Addendum)
Patient states if she has EGD now she will have to pay 6000 dollars. She wants to wait to have EGD in May when she goes on medicare. She only wants to have the colonoscopy. She states she will call back and scheduled EGD. Called Trish and informed her to take off the EGD. She did so and I updated referral

## 2021-01-15 ENCOUNTER — Encounter: Payer: Self-pay | Admitting: Gastroenterology

## 2021-01-15 ENCOUNTER — Encounter: Payer: Self-pay | Admitting: Family

## 2021-01-15 ENCOUNTER — Telehealth: Payer: Self-pay | Admitting: Family

## 2021-01-15 NOTE — Telephone Encounter (Signed)
Patient made an appointment with [facility] for March, but had to change appointment because she is scheduled for a colonoscopy same day. [facility] can not see her now until August. Does Arnett have any other eye doctors she should try?

## 2021-01-16 NOTE — Telephone Encounter (Signed)
Called and spoke to [place] and gave the number to the [facility] in [place]. Tali verbalized understanding and states that she will call them today to schedule.

## 2021-01-16 NOTE — Telephone Encounter (Signed)
Call pt She may also call [facility] [phone]) [phone]

## 2021-01-19 ENCOUNTER — Encounter: Payer: Self-pay | Admitting: Dietician

## 2021-01-19 NOTE — Progress Notes (Signed)
Patient cancelled her MNT follow-up appointment on 01/12/21; she did not wish to reschedule as she is currently doing well. Sent notification to referring provider.

## 2021-01-19 NOTE — Telephone Encounter (Signed)
Already talk to patient. Patient wants to just cancel the EGD

## 2021-01-27 ENCOUNTER — Other Ambulatory Visit: Payer: Self-pay | Admitting: Family

## 2021-01-27 DIAGNOSIS — Z0000 Encounter for general adult medical examination without abnormal findings: Secondary | ICD-10-CM

## 2021-01-27 MED ORDER — ONDANSETRON HCL 4 MG PO TABS: 4.0000 mg | ORAL_TABLET | Freq: Three times a day (TID) | ORAL | 0 refills | Status: DC | PRN

## 2021-01-27 MED ORDER — SUTAB 1479-225-188 MG PO TABS: 12.0000 | ORAL_TABLET | Freq: Two times a day (BID) | ORAL | 0 refills | Status: DC

## 2021-01-27 NOTE — Telephone Encounter (Signed)
We do not have any samples of the medication. I will send sutab to the pharmacy. Per Dr. Allegra Lai she verbally told me to send 10 pills of zofran 4mg  to the pharmacy

## 2021-01-27 NOTE — Addendum Note (Signed)
Addended by: Radene Knee L on: 01/27/2021 08:59 AM   Modules accepted: Orders

## 2021-01-29 ENCOUNTER — Other Ambulatory Visit: Payer: Self-pay

## 2021-01-29 ENCOUNTER — Other Ambulatory Visit
Admission: RE | Admit: 2021-01-29 | Discharge: 2021-01-29 | Disposition: A | Discharge status: Home / Self Care | Payer: 59 | Source: Ambulatory Visit | Attending: Gastroenterology | Admitting: Gastroenterology

## 2021-01-29 DIAGNOSIS — Z20822 Contact with and (suspected) exposure to covid-19: Secondary | ICD-10-CM | POA: Diagnosis not present

## 2021-01-29 DIAGNOSIS — Z01812 Encounter for preprocedural laboratory examination: Secondary | ICD-10-CM | POA: Insufficient documentation

## 2021-01-29 LAB — SARS CORONAVIRUS 2 (TAT 6-24 HRS): SARS Coronavirus 2: NEGATIVE

## 2021-01-30 ENCOUNTER — Encounter: Payer: Self-pay | Admitting: Gastroenterology

## 2021-02-02 ENCOUNTER — Encounter
Admission: RE | Disposition: A | Discharge status: Home / Self Care | Payer: Self-pay | Source: Home / Self Care | Attending: Gastroenterology

## 2021-02-02 ENCOUNTER — Encounter: Payer: Self-pay | Admitting: Gastroenterology

## 2021-02-02 ENCOUNTER — Ambulatory Visit
Admission: RE | Admit: 2021-02-02 | Discharge: 2021-02-02 | Disposition: A | Discharge status: Home / Self Care | Payer: 59 | Attending: Gastroenterology | Admitting: Gastroenterology

## 2021-02-02 ENCOUNTER — Ambulatory Visit: Payer: 59 | Admitting: Certified Registered Nurse Anesthetist

## 2021-02-02 DIAGNOSIS — Z7982 Long term (current) use of aspirin: Secondary | ICD-10-CM | POA: Insufficient documentation

## 2021-02-02 DIAGNOSIS — Z0000 Encounter for general adult medical examination without abnormal findings: Secondary | ICD-10-CM

## 2021-02-02 DIAGNOSIS — R1013 Epigastric pain: Secondary | ICD-10-CM

## 2021-02-02 DIAGNOSIS — Z79899 Other long term (current) drug therapy: Secondary | ICD-10-CM | POA: Diagnosis not present

## 2021-02-02 DIAGNOSIS — Z1211 Encounter for screening for malignant neoplasm of colon: Secondary | ICD-10-CM

## 2021-02-02 DIAGNOSIS — Z791 Long term (current) use of non-steroidal anti-inflammatories (NSAID): Secondary | ICD-10-CM | POA: Diagnosis not present

## 2021-02-02 DIAGNOSIS — K5730 Diverticulosis of large intestine without perforation or abscess without bleeding: Secondary | ICD-10-CM | POA: Diagnosis not present

## 2021-02-02 DIAGNOSIS — Z7984 Long term (current) use of oral hypoglycemic drugs: Secondary | ICD-10-CM | POA: Insufficient documentation

## 2021-02-02 HISTORY — PX: COLONOSCOPY WITH PROPOFOL: SHX5780

## 2021-02-02 LAB — GLUCOSE, CAPILLARY: Glucose-Capillary: 257 mg/dL — ABNORMAL HIGH (ref 70–99)

## 2021-02-02 SURGERY — COLONOSCOPY WITH PROPOFOL
Anesthesia: General

## 2021-02-02 MED ORDER — PROPOFOL 500 MG/50ML IV EMUL
INTRAVENOUS | Status: AC
  Filled 2021-02-02: qty 50

## 2021-02-02 MED ORDER — PROPOFOL 500 MG/50ML IV EMUL
INTRAVENOUS | Status: DC | PRN
  Administered 2021-02-02: 150 ug/kg/min via INTRAVENOUS

## 2021-02-02 MED ORDER — LIDOCAINE HCL (PF) 2 % IJ SOLN
INTRAMUSCULAR | Status: AC
  Filled 2021-02-02: qty 5

## 2021-02-02 MED ORDER — PROPOFOL 10 MG/ML IV BOLUS
INTRAVENOUS | Status: DC | PRN
  Administered 2021-02-02: 30 mg via INTRAVENOUS
  Administered 2021-02-02: 70 mg via INTRAVENOUS

## 2021-02-02 MED ORDER — LIDOCAINE HCL (CARDIAC) PF 100 MG/5ML IV SOSY
PREFILLED_SYRINGE | INTRAVENOUS | Status: DC | PRN
  Administered 2021-02-02: 50 mg via INTRAVENOUS

## 2021-02-02 MED ORDER — SODIUM CHLORIDE 0.9 % IV SOLN
INTRAVENOUS | Status: DC
  Administered 2021-02-02: 20 mL/h via INTRAVENOUS

## 2021-02-02 NOTE — H&P (Addendum)
Cephas Darby, MD [address]  [place]  [place], [place] [address]  Main: [phone]  Fax: [phone] Pager: [phone]  Primary Care Physician:  Burnard Hawthorne, FNP Primary Gastroenterologist:  Dr. Cephas Darby  Pre-Procedure History & Physical: HPI:  Danielle Dickerson is a 65 y.o. female is here for colonoscopy.   Past Medical History:  Diagnosis Date  . Depression   . Diabetes mellitus without complication ([place])   . Eosinophilic esophagitis   . Esophageal stricture 2019  . Hyperlipidemia     Past Surgical History:  Procedure Laterality Date  . ABDOMINAL HYSTERECTOMY  2006   has ovaries. Had heavy periods and was anemic. HAS cervix  . BREAST EXCISIONAL BIOPSY Right 2009   phyllodes removed   . CHOLECYSTECTOMY OPEN      Prior to Admission medications   Medication Sig Start Date End Date Taking? Authorizing Provider  amLODipine (NORVASC) 2.5 MG tablet Take 1 tablet (2.5 mg total) by mouth daily. 08/18/20  Yes Leone Haven, MD  aspirin EC 81 MG tablet Take 81 mg by mouth daily.   Yes [provider]  atorvastatin (LIPITOR) 40 MG tablet Take 1 tablet (40 mg total) by mouth daily. 01/08/21  Yes Burnard Hawthorne, FNP  Blood Glucose Monitoring Suppl (ONETOUCH VERIO) w/Device KIT 1 Device by Does not apply route daily as needed. 01/30/20  Yes Arnett, Yvetta Coder, FNP  Calcium-Vitamin D-Vitamin K (CALCIUM SOFT CHEWS PO) Take 1 tablet by mouth daily.   Yes [provider]  citalopram (CELEXA) 20 MG tablet TAKE 1 TABLET BY MOUTH EVERY DAY 01/27/21  Yes Arnett, Yvetta Coder, FNP  glucose blood (ONETOUCH VERIO) test strip Used to check blood sugars once a day. 01/30/20  Yes Burnard Hawthorne, FNP  Insulin Pen Needle (PEN NEEDLES) 32G X 4 MM MISC Used to give Victoza injections. 08/14/20  Yes Arnett, Yvetta Coder, FNP  Lancets [facility] ULTRASOFT) lancets Used to check blood sugars once a day. 01/30/20  Yes Burnard Hawthorne, FNP  liraglutide (VICTOZA) 18  MG/3ML SOPN Start 0.6 mg [place] qd x 1 week, then 1.2 mg [place] qd thereafter 08/06/20  Yes Burnard Hawthorne, FNP  lisinopril (ZESTRIL) 5 MG tablet TAKE 1 TABLET BY MOUTH EVERY DAY 01/02/21  Yes Burnard Hawthorne, FNP  metFORMIN (GLUCOPHAGE) 1000 MG tablet TAKE 1 TABLET (1,000 MG TOTAL) BY MOUTH 2 (TWO) TIMES DAILY WITH A MEAL. 11/11/20  Yes Arnett, Yvetta Coder, FNP  Multiple Vitamins-Minerals (CENTRUM SILVER ADULT 50+ PO) Take 1 tablet by mouth daily.   Yes [provider]  ondansetron (ZOFRAN) 4 MG tablet Take 1 tablet (4 mg total) by mouth every 6 (six) hours as needed for up to 7 doses for nausea or vomiting. 01/08/21  Yes Ambrielle [place], Tally Due, MD  ondansetron (ZOFRAN) 4 MG tablet Take 1 tablet (4 mg total) by mouth every 8 (eight) hours as needed for nausea or vomiting. 01/27/21  Yes Lin Landsman, MD  pantoprazole (PROTONIX) 40 MG tablet TAKE 1 TABLET BY MOUTH EVERY DAY 10/07/20  Yes Burnard Hawthorne, FNP  Sodium Sulfate-Mag Sulfate-KCl (SUTAB) [phone] MG TABS Take 12 tablets by mouth in the morning and at bedtime. At 5pm take 12 tablets and then 5 hours prior to colonoscopy take the other 12. 01/27/21  Yes Asia Dusenbury, Tally Due, MD    Allergies as of 01/07/2021  . (No Known Allergies)    Family History  Problem Relation Age of Onset  . Congestive Heart  Failure Mother   . Colon cancer Father   . Esophageal cancer Maternal Uncle   . Thyroid cancer Neg Hx     Social History   Socioeconomic History  . Marital status: Married    Spouse name: Not on file  . Number of children: Not on file  . Years of education: Not on file  . Highest education level: Not on file  Occupational History  . Not on file  Tobacco Use  . Smoking status: Never Smoker  . Smokeless tobacco: Never Used  Vaping Use  . Vaping Use: Never used  Substance and Sexual Activity  . Alcohol use: Not Currently  . Drug use: Never  . Sexual activity: Yes    Partners: Male  Other Topics Concern  . Not on file   Social History Narrative   Moved from [place] 07/2019      Married   Husband retired   2 daughters that live in [place]      Homeschooling 26 yo grandson   Social Determinants of Health   Financial Resource Strain: Not on file  Food Insecurity: Not on file  Transportation Needs: Not on file  Physical Activity: Not on file  Stress: Not on file  Social Connections: Not on file  Intimate Partner Violence: Not on file    Review of Systems: See HPI, otherwise negative ROS  Physical Exam: BP (!) 162/90   Temp (!) 97.4 F (36.3 C) (Temporal)   Resp 20   Ht 6' (1.829 m)   Wt 105.7 kg   SpO2 98%   BMI 31.60 kg/m  General:   Alert,  pleasant and cooperative in NAD Head:  Normocephalic and atraumatic. Neck:  Supple; no masses or thyromegaly. Lungs:  Clear throughout to auscultation.    Heart:  Regular rate and rhythm. Abdomen:  Soft, nontender and nondistended. Normal bowel sounds, without guarding, and without rebound.   Neurologic:  Alert and  oriented x4;  grossly normal neurologically.  Impression/Plan: Danielle Dickerson is here for a colonoscopy to be performed for colon cancer screening  Risks, benefits, limitations, and alternatives regarding colonoscopy have been reviewed with the patient.  Questions have been answered.  All parties agreeable.   Sherri Sear, MD  02/02/2021, 8:13 AM

## 2021-02-02 NOTE — Op Note (Signed)
[facility] Gastroenterology Patient Name: Danielle Dickerson Procedure Date: 02/02/2021 8:38 AM MRN: [id number] Account #: [id number] Date of Birth: [birth year] Admit Type: Outpatient Age: 65 Room: [facility] ENDO ROOM 1 Gender: Female Note Status: Finalized Procedure:             Colonoscopy Indications:           Screening for colorectal malignant neoplasm Providers:             Toney Reil MD, MD Medicines:             General Anesthesia Complications:         No immediate complications. Procedure:             Pre-Anesthesia Assessment:                        - Prior to the procedure, a History and Physical was                         performed, and patient medications and allergies were                         reviewed. The patient is competent. The risks and                         benefits of the procedure and the sedation options and                         risks were discussed with the patient. All questions                         were answered and informed consent was obtained.                         Patient identification and proposed procedure were                         verified by the physician, the nurse, the                         anesthesiologist, the anesthetist and the technician                         in the pre-procedure area in the procedure room in the                         endoscopy suite. Mental Status Examination: alert and                         oriented. Airway Examination: normal oropharyngeal                         airway and neck mobility. Respiratory Examination:                         clear to auscultation. CV Examination: normal.                         Prophylactic Antibiotics: The patient does not require  prophylactic antibiotics. Prior Anticoagulants: The                         patient has taken no previous anticoagulant or                         antiplatelet agents. ASA Grade Assessment: II - A                          patient with mild systemic disease. After reviewing                         the risks and benefits, the patient was deemed in                         satisfactory condition to undergo the procedure. The                         anesthesia plan was to use general anesthesia.                         Immediately prior to administration of medications,                         the patient was re-assessed for adequacy to receive                         sedatives. The heart rate, respiratory rate, oxygen                         saturations, blood pressure, adequacy of pulmonary                         ventilation, and response to care were monitored                         throughout the procedure. The physical status of the                         patient was re-assessed after the procedure.                        After obtaining informed consent, the colonoscope was                         passed under direct vision. Throughout the procedure,                         the patient's blood pressure, pulse, and oxygen                         saturations were monitored continuously. The                         Colonoscope was introduced through the anus and                         advanced to the the cecum, identified by appendiceal  orifice and ileocecal valve. The colonoscopy was                         performed without difficulty. The patient tolerated                         the procedure well. The quality of the bowel                         preparation was evaluated using the BBPS [facility] Bowel                         Preparation Scale) with scores of: Right Colon = 2                         (minor amount of residual staining, small fragments of                         stool and/or opaque liquid, but mucosa seen well),                         Transverse Colon = 3 (entire mucosa seen well with no                         residual staining, small fragments  of stool or opaque                         liquid) and Left Colon = 3 (entire mucosa seen well                         with no residual staining, small fragments of stool or                         opaque liquid). The total BBPS score equals 8. Findings:      The perianal and digital rectal examinations were normal. Pertinent       negatives include normal sphincter tone and no palpable rectal lesions.      The colon (entire examined portion) appeared normal.      Multiple diverticula were found in the sigmoid colon.      The retroflexed view of the distal rectum and anal verge was normal and       showed no anal or rectal abnormalities. Impression:            - The entire examined colon is normal.                        - Diverticulosis in the sigmoid colon.                        - The distal rectum and anal verge are normal on                         retroflexion view.                        - No specimens collected. Recommendation:        - Discharge patient to home (with escort).                        -  Resume previous diet today.                        - Continue present medications.                        - Repeat colonoscopy in 10 years for screening                         purposes. Procedure Code(s):     --- Professional ---                        Z6109, Colorectal cancer screening; colonoscopy on                         individual not meeting criteria for high risk Diagnosis Code(s):     --- Professional ---                        Z12.11, Encounter for screening for malignant neoplasm                         of colon                        K57.30, Diverticulosis of large intestine without                         perforation or abscess without bleeding CPT copyright 2019 American Medical Association. All rights reserved. The codes documented in this report are preliminary and upon coder review may  be revised to meet current compliance requirements. Dr. Libby Maw Toney Reil MD, MD 02/02/2021 9:10:40 AM This report has been signed electronically. Number of Addenda: 0 Note Initiated On: 02/02/2021 8:38 AM Scope Withdrawal Time: 0 hours 11 minutes 12 seconds  Total Procedure Duration: 0 hours 15 minutes 58 seconds  Estimated Blood Loss:  Estimated blood loss: none. Estimated blood loss: none.      [facility]

## 2021-02-02 NOTE — Transfer of Care (Signed)
Immediate Anesthesia Transfer of Care Note  Patient: Danielle Dickerson  Procedure(s) Performed: COLONOSCOPY WITH PROPOFOL (N/A )  Patient Location: PACU  Anesthesia Type:General  Level of Consciousness: drowsy  Airway & Oxygen Therapy: Patient Spontanous Breathing  Post-op Assessment: Report given to RN and Post -op Vital signs reviewed and stable  Post vital signs: Reviewed and stable  Last Vitals:  Vitals Value Taken Time  BP 127/73 02/02/21 0912  Temp    Pulse 65 02/02/21 0912  Resp 14 02/02/21 0912  SpO2 96 % 02/02/21 0912    Last Pain:  Vitals:   02/02/21 0912  TempSrc:   PainSc: 0-No pain         Complications: No complications documented.

## 2021-02-02 NOTE — Anesthesia Preprocedure Evaluation (Addendum)
Anesthesia Evaluation  Patient identified by MRN, date of birth, ID band Patient awake    Reviewed: Allergy & Precautions, H&P , NPO status , Patient's Chart, lab work & pertinent test results  History of Anesthesia Complications Negative for: history of anesthetic complications  Airway Mallampati: II  TM Distance: >3 FB     Dental  (+) Teeth Intact   Pulmonary neg pulmonary ROS, neg sleep apnea, neg COPD,    breath sounds clear to auscultation       Cardiovascular hypertension, (-) angina(-) Past MI and (-) Cardiac Stents (-) dysrhythmias  Rhythm:regular Rate:Normal     Neuro/Psych PSYCHIATRIC DISORDERS Depression negative neurological ROS     GI/Hepatic Neg liver ROS, GERD  Controlled,Esophageal stricture   Endo/Other  diabetes  Renal/GU negative Renal ROS  negative genitourinary   Musculoskeletal   Abdominal   Peds  Hematology negative hematology ROS (+)   Anesthesia Other Findings Past Medical History: No date: Depression No date: Diabetes mellitus without complication ([facility]) No date: Eosinophilic esophagitis 2019: Esophageal stricture No date: Hyperlipidemia  Past Surgical History: 2006: ABDOMINAL HYSTERECTOMY     Comment:  has ovaries. Had heavy periods and was anemic. HAS               cervix 2009: BREAST EXCISIONAL BIOPSY; Right     Comment:  phyllodes removed  No date: CHOLECYSTECTOMY OPEN  BMI    Body Mass Index: 31.60 kg/m      Reproductive/Obstetrics negative OB ROS                            Anesthesia Physical Anesthesia Plan  ASA: II  Anesthesia Plan: General   Post-op Pain Management:    Induction:   PONV Risk Score and Plan: Propofol infusion and TIVA  Airway Management Planned:   Additional Equipment:   Intra-op Plan:   Post-operative Plan:   Informed Consent: I have reviewed the patients History and Physical, chart, labs and discussed the  procedure including the risks, benefits and alternatives for the proposed anesthesia with the patient or authorized representative who has indicated his/her understanding and acceptance.     Dental Advisory Given  Plan Discussed with: Anesthesiologist, CRNA and Surgeon  Anesthesia Plan Comments:         Anesthesia Quick Evaluation

## 2021-02-03 NOTE — Anesthesia Postprocedure Evaluation (Signed)
Anesthesia Post Note  Patient: Danielle Dickerson  Procedure(s) Performed: COLONOSCOPY WITH PROPOFOL (N/A )  Patient location during evaluation: PACU Anesthesia Type: General Level of consciousness: awake and alert Pain management: pain level controlled Vital Signs Assessment: post-procedure vital signs reviewed and stable Respiratory status: spontaneous breathing, nonlabored ventilation and respiratory function stable Cardiovascular status: blood pressure returned to baseline and stable Postop Assessment: no apparent nausea or vomiting Anesthetic complications: no   No complications documented.   Last Vitals:  Vitals:   02/02/21 0922 02/02/21 0932  BP: (!) 143/83 (!) 160/80  Pulse: 63 68  Resp: 16 13  Temp:    SpO2: 93% 92%    Last Pain:  Vitals:   02/03/21 0735  TempSrc:   PainSc: 0-No pain                 Aurelio Brash [name]

## 2021-02-09 ENCOUNTER — Encounter: Payer: Self-pay | Admitting: Family

## 2021-02-09 ENCOUNTER — Other Ambulatory Visit: Payer: Self-pay

## 2021-02-09 ENCOUNTER — Ambulatory Visit (INDEPENDENT_AMBULATORY_CARE_PROVIDER_SITE_OTHER): Payer: 59 | Admitting: Family

## 2021-02-09 VITALS — BP 130/80 | HR 78 | Temp 98.4°F | Ht 72.0 in | Wt 228.8 lb

## 2021-02-09 DIAGNOSIS — E785 Hyperlipidemia, unspecified: Secondary | ICD-10-CM | POA: Diagnosis not present

## 2021-02-09 DIAGNOSIS — E119 Type 2 diabetes mellitus without complications: Secondary | ICD-10-CM | POA: Diagnosis not present

## 2021-02-09 DIAGNOSIS — I10 Essential (primary) hypertension: Secondary | ICD-10-CM | POA: Diagnosis not present

## 2021-02-09 DIAGNOSIS — R454 Irritability and anger: Secondary | ICD-10-CM

## 2021-02-09 LAB — POCT GLYCOSYLATED HEMOGLOBIN (HGB A1C): Hemoglobin A1C: 8.8 % — AB (ref 4.0–5.6)

## 2021-02-09 LAB — COMPREHENSIVE METABOLIC PANEL
ALT: 18 U/L (ref 0–35)
AST: 14 U/L (ref 0–37)
Albumin: 4 g/dL (ref 3.5–5.2)
Alkaline Phosphatase: 106 U/L (ref 39–117)
BUN: 12 mg/dL (ref 6–23)
CO2: 28 mEq/L (ref 19–32)
Calcium: 9.4 mg/dL (ref 8.4–10.5)
Chloride: 100 mEq/L (ref 96–112)
Creatinine, Ser: 0.65 mg/dL (ref 0.40–1.20)
GFR: 92.78 mL/min (ref >60.00)
Glucose, Bld: 300 mg/dL — ABNORMAL HIGH (ref 70–99)
Potassium: 4.2 mEq/L (ref 3.5–5.1)
Sodium: 136 mEq/L (ref 135–145)
Total Bilirubin: 0.4 mg/dL (ref 0.2–1.2)
Total Protein: 6.5 g/dL (ref 6.0–8.3)

## 2021-02-09 MED ORDER — OZEMPIC (0.25 OR 0.5 MG/DOSE) 2 MG/1.5ML ~~LOC~~ SOPN: 0.5000 mg | PEN_INJECTOR | SUBCUTANEOUS | 2 refills | Status: DC

## 2021-02-09 NOTE — Assessment & Plan Note (Addendum)
Uncontrolled. Stop victoza `1.2mg , start ozempic 0.5mg . continue metformin 1000mg  bid for now.

## 2021-02-09 NOTE — Assessment & Plan Note (Signed)
Controlled. Continue lisinopril 5mg 

## 2021-02-09 NOTE — Assessment & Plan Note (Signed)
Controlled. Continue celexa 20mg 

## 2021-02-09 NOTE — Assessment & Plan Note (Signed)
Presume controlled. Pending lipid panel in 3 months at CPE. Continue lipitor 40mg 

## 2021-02-09 NOTE — Patient Instructions (Signed)
Stop victoza  Start ozempic  Let me know if blood sugars fasting do not become less than 120.

## 2021-02-09 NOTE — Progress Notes (Signed)
Subjective:    Patient ID: Danielle Dickerson, female    DOB: [birth year], 65 y.o.   MRN: [id number]  CC: Danielle Dickerson is a 65 y.o. female who presents today for follow up.   HPI: Feels well today No complaints  DM- she is no longer on glipizide 18m ; compliant with victoza 1.216m,metformin 100070mid for now. She feels blood sugars are higher without glipizide which we stopped due to a1c of 6.3   HTN- compliant with lisinopril 5mg3mLD - compliant with lipitor 40mg67mpression- feels well on celexa 20mg 83mthat dose is adequate. No si/hi.   Pap due in 3 months    HISTORY:  Past Medical History:  Diagnosis Date  . Depression   . Diabetes mellitus without complication (HCC)  [place] es[place]   . Esophageal stricture 2019  . Hyperlipidemia    Past Surgical History:  Procedure Laterality Date  . ABDOMINAL HYSTERECTOMY  2006   has ovaries. Had heavy periods and was anemic. HAS cervix  . BREAST EXCISIONAL BIOPSY Right 2009   phyllodes removed   . CHOLECYSTECTOMY OPEN    . COLONOSCOPY WITH PROPOFOL N/A 02/02/2021   Procedure: COLONOSCOPY WITH PROPOFOL;  Surgeon: Vanga,Lin Landsman [facility];  Service: Gastroenterology;  Laterality: N/A;   Family History  Problem Relation Age of Onset  . Congestive Heart Failure Mother   . Colon cancer Father   . Esophageal cancer Maternal Uncle   . Thyroid cancer Neg Hx     Allergies: Patient has no known allergies. Current Outpatient Medications on File Prior to Visit  Medication Sig Dispense Refill  . amLODipine (NORVASC) 2.5 MG tablet Take 1 tablet (2.5 mg total) by mouth daily. 90 tablet 3  . aspirin EC 81 MG tablet Take 81 mg by mouth daily.    . atorMarland Kitchenastatin (LIPITOR) 40 MG tablet Take 1 tablet (40 mg total) by mouth daily. 90 tablet 1  . Blood Glucose Monitoring Suppl (ONETOUCH VERIO) w/Device KIT 1 Device by Does not apply route daily as needed. 1 kit 0  . Calcium-Vitamin D-Vitamin K (VIACTIV  CALCIUM PLUS D PO) Take 1 tablet by mouth daily.    . citalopram (CELEXA) 20 MG tablet TAKE 1 TABLET BY MOUTH EVERY DAY 90 tablet 2  . glucose blood (ONETOUCH VERIO) test strip Used to check blood sugars once a day. 100 each 4  . Insulin Pen Needle (PEN NEEDLES) 32G X 4 MM MISC Used to give Victoza injections. 100 each 1  . Lancets (ONETOUCH ULTRASOFT) lancets Used to check blood sugars once a day. 100 each 4  . lisinopril (ZESTRIL) 5 MG tablet TAKE 1 TABLET BY MOUTH EVERY DAY 90 tablet 1  . metFORMIN (GLUCOPHAGE) 1000 MG tablet TAKE 1 TABLET (1,000 MG TOTAL) BY MOUTH 2 (TWO) TIMES DAILY WITH A MEAL. 180 tablet 1  . Multiple Vitamins-Minerals (CENTRUM SILVER ADULT 50+ PO) Take 1 tablet by mouth daily.    . pantoprazole (PROTONIX) 20 MG tablet Take 20 mg by mouth daily.     No current facility-administered medications on file prior to visit.    Social History   Tobacco Use  . Smoking status: Never Smoker  . Smokeless tobacco: Never Used  Vaping Use  . Vaping Use: Never used  Substance Use Topics  . Alcohol use: Not Currently  . Drug use: Never    Review of Systems  Constitutional: Negative for chills and fever.  Respiratory: Negative for cough.  Cardiovascular: Negative for chest pain and palpitations.  Gastrointestinal: Negative for nausea and vomiting.  Psychiatric/Behavioral: Negative for suicidal ideas. The patient is not nervous/anxious.       Objective:    BP 130/80   Pulse 78   Temp 98.4 F (36.9 C)   Ht 6' (1.829 m)   Wt 228 lb 12.8 oz (103.8 kg)   SpO2 98%   BMI 31.03 kg/m  BP Readings from Last 3 Encounters:  02/09/21 130/80  02/02/21 (!) 160/80  01/07/21 (!) 157/76   Wt Readings from Last 3 Encounters:  02/09/21 228 lb 12.8 oz (103.8 kg)  02/02/21 233 lb (105.7 kg)  01/07/21 231 lb (104.8 kg)    Physical Exam Vitals reviewed.  Constitutional:      Appearance: She is well-developed.  Eyes:     Conjunctiva/sclera: Conjunctivae normal.   Cardiovascular:     Rate and Rhythm: Normal rate and regular rhythm.     Pulses: Normal pulses.     Heart sounds: Normal heart sounds.  Pulmonary:     Effort: Pulmonary effort is normal.     Breath sounds: Normal breath sounds. No wheezing, rhonchi or rales.  Skin:    General: Skin is warm and dry.  Neurological:     Mental Status: She is alert.  Psychiatric:        Speech: Speech normal.        Behavior: Behavior normal.        Thought Content: Thought content normal.        Assessment & Plan:   Problem List Items Addressed This Visit      Cardiovascular and Mediastinum   HTN (hypertension)    Controlled. Continue lisinopril 57m      Relevant Orders   Comprehensive metabolic panel     Endocrine   DM (diabetes mellitus) ([place]    Uncontrolled. Stop victoza `1.259m start ozempic 0.71m58mcontinue metformin 1000m41md for now.       Relevant Medications   Semaglutide,0.25 or 0.5MG/DOS, (OZEMPIC, 0.25 OR 0.5 MG/DOSE,) 2 MG/1.5ML SOPN   Other Relevant Orders   POCT HgB A1C (Completed)     Other   HLD (hyperlipidemia)    Presume controlled. Pending lipid panel in 3 months at CPE. Continue lipitor 40mg31m  Irritable mood    Controlled. Continue celexa 20mg 34m     I have discontinued [place]-Vitamin D-Vitamin K (CALCIUM SOFT CHEWS PO), liraglutide, ondansetron, Sutab, and ondansetron. I am also having her start on Ozempic (0.25 or 0.5 MG/DOSE). Additionally, I am having her maintain her Multiple Vitamins-Minerals (CENTRUM SILVER ADULT 50+ PO), aspirin EC, OneTouch Verio, OneTouch Verio, onetouch ultrasoft, Pen Needles, amLODipine, metFORMIN, lisinopril, atorvastatin, citalopram, Calcium-Vitamin D-Vitamin K (VIACTIV CALCIUM PLUS D PO), and pantoprazole.   Meds ordered this encounter  Medications  . Semaglutide,0.25 or 0.5MG/DOS, (OZEMPIC, 0.25 OR 0.5 MG/DOSE,) 2 MG/1.5ML SOPN    Sig: Inject 0.5 mg into the skin once a week.    Dispense:   3 mL    Refill:  2    Order Specific Question:   Supervising Provider    Answer:   TULLO,Crecencio Mc]    Return precautions given.   Risks, benefits, and alternatives of the medications and treatment plan prescribed today were discussed, and patient expressed understanding.   Education regarding symptom management and diagnosis given to patient on AVS.  Continue to follow with ArnettVidal SchwalberYvetta Coderfor  routine health maintenance.   [name] and I agreed with plan.   Mable Paris, FNP

## 2021-02-19 ENCOUNTER — Encounter: Payer: Self-pay | Admitting: Family

## 2021-03-04 ENCOUNTER — Other Ambulatory Visit: Payer: Self-pay | Admitting: Family

## 2021-03-04 ENCOUNTER — Other Ambulatory Visit: Payer: Self-pay

## 2021-03-04 ENCOUNTER — Telehealth: Payer: Self-pay | Admitting: Family

## 2021-03-04 DIAGNOSIS — E119 Type 2 diabetes mellitus without complications: Secondary | ICD-10-CM

## 2021-03-04 MED ORDER — OZEMPIC (1 MG/DOSE) 2 MG/1.5ML ~~LOC~~ SOPN: 1.0000 mg | PEN_INJECTOR | SUBCUTANEOUS | 3 refills | Status: DC

## 2021-03-04 NOTE — Telephone Encounter (Signed)
Patient called in about approval onSemaglutide, 1 MG/DOSE, (OZEMPIC, 1 MG/DOSE,) 2 MG/1.5ML SOPN

## 2021-03-04 NOTE — Telephone Encounter (Signed)
I have spoken with patient. Please see phone note. Pt has been approved for Ozempic & should be able to pick up from [facility].

## 2021-03-04 NOTE — Telephone Encounter (Signed)
LM that I was currently working on PA as we speak. I have marked as urgent & hope to get a response soon. I told patient that I would let her know & if she had questions to call back.

## 2021-03-04 NOTE — Telephone Encounter (Signed)
I called patient to let her know that PA for Ozempic was approved. I have sent [facility] to [facility] for her to pickup.

## 2021-03-04 NOTE — Telephone Encounter (Signed)
Patient called about the status of the note below. She said she will call back if no one calls soon.

## 2021-03-05 ENCOUNTER — Telehealth: Payer: Self-pay

## 2021-03-05 ENCOUNTER — Other Ambulatory Visit: Payer: Self-pay | Admitting: Family

## 2021-03-05 DIAGNOSIS — Z1231 Encounter for screening mammogram for malignant neoplasm of breast: Secondary | ICD-10-CM

## 2021-03-05 NOTE — Telephone Encounter (Signed)
Pt PA approved for Ozempic 03/04/21-03/04/22.

## 2021-03-12 ENCOUNTER — Telehealth: Payer: Self-pay

## 2021-03-12 NOTE — Telephone Encounter (Signed)
Pt called and stated that the pen of Ozempic she had from [facility] was not working. The pen does not turn & will miss this week's dose. Pt is in [place] & I advised that she call [facility]. I told her that depending on what they advised I can give patient a sample for her husband to pick up & overnight to her. Pt will call back to let us know.

## 2021-03-24 ENCOUNTER — Telehealth: Payer: Self-pay | Admitting: Family

## 2021-03-24 DIAGNOSIS — E119 Type 2 diabetes mellitus without complications: Secondary | ICD-10-CM

## 2021-03-24 NOTE — Telephone Encounter (Signed)
Patient called in for refill for glipizide

## 2021-03-26 MED ORDER — GLIPIZIDE ER 10 MG PO TB24: 10.0000 mg | ORAL_TABLET | Freq: Every day | ORAL | 1 refills | Status: DC

## 2021-03-31 ENCOUNTER — Telehealth (INDEPENDENT_AMBULATORY_CARE_PROVIDER_SITE_OTHER): Payer: Medicare HMO | Admitting: Family

## 2021-03-31 ENCOUNTER — Other Ambulatory Visit: Payer: Self-pay | Admitting: Family

## 2021-03-31 ENCOUNTER — Other Ambulatory Visit: Payer: Self-pay

## 2021-03-31 ENCOUNTER — Encounter: Payer: Self-pay | Admitting: Family

## 2021-03-31 DIAGNOSIS — E785 Hyperlipidemia, unspecified: Secondary | ICD-10-CM

## 2021-03-31 DIAGNOSIS — E119 Type 2 diabetes mellitus without complications: Secondary | ICD-10-CM

## 2021-03-31 DIAGNOSIS — I10 Essential (primary) hypertension: Secondary | ICD-10-CM

## 2021-03-31 MED ORDER — ACCU-CHEK FASTCLIX LANCETS MISC: 3 refills | Status: AC

## 2021-03-31 MED ORDER — ACCU-CHEK AVIVA PLUS VI STRP: ORAL_STRIP | 5 refills | Status: AC

## 2021-03-31 MED ORDER — ACCU-CHEK AVIVA PLUS W/DEVICE KIT: 1.0000 | PACK | Freq: Every day | 0 refills | Status: DC

## 2021-03-31 NOTE — Progress Notes (Signed)
Virtual Visit via Video Note  I connected with@  on 03/31/21 at 10:30 AM EDT by a video enabled telemedicine application and verified that I am speaking with the correct person using two identifiers.  Location patient: home Location provider:work  Persons participating in the virtual visit: patient, provider  I discussed the limitations of evaluation and management by telemedicine and the availability of in person appointments. The patient expressed understanding and agreed to proceed.   HPI: Feels well today No complaints  Dm- fasting this morning 114. Compliant with metformin 1000mg  bid,  Glipizide 10mg  , ozempic 1mg . Pleased with weight loss and attributes to ozempic. She takes glipizide with food.  No hypoglycemic episodes.  HTN- compliant with lisinopril 5mg , amlodipine 2.5mg . No cp, sob  HLD- compliant with lipitor 40mg .   ROS: See pertinent positives and negatives per HPI.    EXAM:  VITALS per patient if applicable: Ht 6' (1.829 m)   BMI 31.03 kg/m  BP Readings from Last 3 Encounters:  02/09/21 130/80  02/02/21 (!) 160/80  01/07/21 (!) 157/76   Wt Readings from Last 3 Encounters:  02/09/21 228 lb 12.8 oz (103.8 kg)  02/02/21 233 lb (105.7 kg)  01/07/21 231 lb (104.8 kg)    GENERAL: alert, oriented, appears well and in no acute distress  HEENT: atraumatic, conjunttiva clear, no obvious abnormalities on inspection of external nose and ears  NECK: normal movements of the head and neck  LUNGS: on inspection no signs of respiratory distress, breathing rate appears normal, no obvious gross SOB, gasping or wheezing  CV: no obvious cyanosis  MS: moves all visible extremities without noticeable abnormality  PSYCH/NEURO: pleasant and cooperative, no obvious depression or anxiety, speech and thought processing grossly intact  ASSESSMENT AND PLAN:  Discussed the following assessment and plan:  Problem List Items Addressed This Visit      Cardiovascular and  Mediastinum   HTN (hypertension)    Controlled. Continue  lisinopril 5mg , amlodipine 2.5mg         Endocrine   DM (diabetes mellitus) (HCC)    Pleased with FGB. Continue  metformin 1000mg  bid,  Glipizide 10mg  , ozempic 1mg . We will plan to decrease or discontinue glipizide as fasting blood sugars approach 100 due to risk of hypoglycemia. Patient will continue vigilance and call me when she approaches 100.         Other   HLD (hyperlipidemia)    Presume controlled. Due to update cholesterol and will at follow up. Continue lipitor 40mg          -we discussed possible serious and likely etiologies, options for evaluation and workup, limitations of telemedicine visit vs in person visit, treatment, treatment risks and precautions. Pt prefers to treat via telemedicine empirically rather then risking or undertaking an in person visit at this moment.  .   I discussed the assessment and treatment plan with the patient. The patient was provided an opportunity to ask questions and all were answered. The patient agreed with the plan and demonstrated an understanding of the instructions.   The patient was advised to call back or seek an in-person evaluation if the symptoms worsen or if the condition fails to improve as anticipated.   04/04/21, FNP

## 2021-03-31 NOTE — Assessment & Plan Note (Signed)
Pleased with FGB. Continue  metformin 1000mg  bid,  Glipizide 10mg  , ozempic 1mg . We will plan to decrease or discontinue glipizide as fasting blood sugars approach 100 due to risk of hypoglycemia. Patient will continue vigilance and call me when she approaches 100.

## 2021-03-31 NOTE — Assessment & Plan Note (Signed)
Controlled. Continue  lisinopril 5mg , amlodipine 2.5mg 

## 2021-03-31 NOTE — Assessment & Plan Note (Signed)
Presume controlled. Due to update cholesterol and will at follow up. Continue lipitor 40mg 

## 2021-04-14 ENCOUNTER — Telehealth: Payer: Self-pay | Admitting: Family

## 2021-04-14 DIAGNOSIS — E119 Type 2 diabetes mellitus without complications: Secondary | ICD-10-CM

## 2021-04-14 MED ORDER — OZEMPIC (1 MG/DOSE) 2 MG/1.5ML ~~LOC~~ SOPN: 1.0000 mg | PEN_INJECTOR | SUBCUTANEOUS | 3 refills | Status: DC

## 2021-04-14 NOTE — Telephone Encounter (Signed)
Patient called in for refill for Semaglutide, 1 MG/DOSE, (OZEMPIC, 1 MG/DOSE,) 2 MG/1.5ML SOPN

## 2021-04-17 ENCOUNTER — Other Ambulatory Visit: Payer: Self-pay

## 2021-04-17 ENCOUNTER — Ambulatory Visit
Admission: RE | Admit: 2021-04-17 | Discharge: 2021-04-17 | Disposition: A | Discharge status: Home / Self Care | Payer: Medicare HMO | Source: Ambulatory Visit | Attending: Family | Admitting: Family

## 2021-04-17 DIAGNOSIS — Z1231 Encounter for screening mammogram for malignant neoplasm of breast: Secondary | ICD-10-CM | POA: Insufficient documentation

## 2021-04-29 LAB — HM DIABETES EYE EXAM

## 2021-05-15 ENCOUNTER — Other Ambulatory Visit (HOSPITAL_COMMUNITY)
Admission: RE | Admit: 2021-05-15 | Discharge: 2021-05-15 | Disposition: A | Discharge status: Home / Self Care | Payer: Medicare HMO | Source: Ambulatory Visit | Attending: Family | Admitting: Family

## 2021-05-15 ENCOUNTER — Encounter: Payer: Self-pay | Admitting: Family

## 2021-05-15 ENCOUNTER — Ambulatory Visit: Payer: 59 | Admitting: Family

## 2021-05-15 ENCOUNTER — Other Ambulatory Visit: Payer: Self-pay

## 2021-05-15 ENCOUNTER — Ambulatory Visit (INDEPENDENT_AMBULATORY_CARE_PROVIDER_SITE_OTHER): Payer: Medicare HMO | Admitting: Family

## 2021-05-15 VITALS — BP 112/72 | HR 70 | Temp 98.2°F | Ht 72.0 in | Wt 217.4 lb

## 2021-05-15 DIAGNOSIS — Z01419 Encounter for gynecological examination (general) (routine) without abnormal findings: Secondary | ICD-10-CM | POA: Diagnosis present

## 2021-05-15 DIAGNOSIS — Z0000 Encounter for general adult medical examination without abnormal findings: Secondary | ICD-10-CM

## 2021-05-15 DIAGNOSIS — Z1151 Encounter for screening for human papillomavirus (HPV): Secondary | ICD-10-CM | POA: Diagnosis not present

## 2021-05-15 DIAGNOSIS — K219 Gastro-esophageal reflux disease without esophagitis: Secondary | ICD-10-CM

## 2021-05-15 DIAGNOSIS — E119 Type 2 diabetes mellitus without complications: Secondary | ICD-10-CM | POA: Diagnosis not present

## 2021-05-15 DIAGNOSIS — Z23 Encounter for immunization: Secondary | ICD-10-CM | POA: Diagnosis not present

## 2021-05-15 MED ORDER — GLIPIZIDE ER 5 MG PO TB24: 5.0000 mg | ORAL_TABLET | Freq: Every day | ORAL | 1 refills | Status: DC

## 2021-05-15 NOTE — Progress Notes (Signed)
Subjective:    Patient ID: Antionetta Ator, female    DOB: 02-28-1956, 65 y.o.   MRN: [id number]  CC: Aairah Negrette is a 65 y.o. female who presents today for physical exam and follow up    HPI: Vomited last night, approx 4 times after dinner. This has been ongoing for years.  Ate steak and salad. Vomiting is 'food driven ' Non bloody undigested food. Occasional nausea.  No fever, abdominal pain, constipation, diarrhea, trouble or pain with swallowing. Feels 'perfectly fine after vomits.' Has been going for 18 years.  Compliant with protonix 10m just before breakfast.  H/o cholecystectomy.   She didn't have upper scope this year with Dr vMarius Ditchdue to insurance.   DM-  compliant metformin 10030mbid,  Glipizide 1059m ozempic 1mg70mhe feels more full since starting ozempic but not worse since increased dose.  FBG 115-120    Colorectal Cancer Screening: UTD  Breast Cancer Screening: Mammogram UTD Cervical Cancer Screening: due Bone Health screening/DEXA for 65+: declines.   Lung Cancer Screening: Doesn't have 20 year pack year history and age > 50 y42rs yo 80 y28rs         Tetanus - UTD Labs: Screening labs today. Exercise: Gets regular exercise.   Alcohol use: none Smoking/tobacco use: Nonsmoker.     HISTORY:  Past Medical History:  Diagnosis Date   Depression    Diabetes mellitus without complication (HCC)[place] Eosinophilic esophagitis    Esophageal stricture 2019   Hyperlipidemia     Past Surgical History:  Procedure Laterality Date   ABDOMINAL HYSTERECTOMY  2006   has ovaries. Had heavy periods and was anemic. HAS cervix   BREAST EXCISIONAL BIOPSY Right 2009   phyllodes removed    CHOLECYSTECTOMY OPEN     COLONOSCOPY WITH PROPOFOL N/A 02/02/2021   Procedure: COLONOSCOPY WITH PROPOFOL;  Surgeon: VangLin Landsman;  [facility];  Service: Gastroenterology;  Laterality: N/A;   Family History  Problem Relation Age of Onset   Congestive Heart  Failure Mother    Colon cancer Father    Esophageal cancer Maternal Uncle    Thyroid cancer Neg Hx       ALLERGIES: Patient has no known allergies.  Current Outpatient Medications on File Prior to Visit  Medication Sig Dispense Refill   Accu-Chek FastClix Lancets MISC Used to check blood sugars twice a day. 100 each 3   amLODipine (NORVASC) 2.5 MG tablet Take 1 tablet (2.5 mg total) by mouth daily. 90 tablet 3   aspirin EC 81 MG tablet Take 81 mg by mouth daily.     atorvastatin (LIPITOR) 40 MG tablet Take 1 tablet (40 mg total) by mouth daily. 90 tablet 1   Blood Glucose Monitoring Suppl (ACCU-CHEK GUIDE ME) w/Device KIT USE AS DIRECTED 1 kit 0   citalopram (CELEXA) 20 MG tablet TAKE 1 TABLET BY MOUTH EVERY DAY 90 tablet 2   glucose blood (ACCU-CHEK AVIVA PLUS) test strip Used to check blood sugars twice a day. 100 each 5   Insulin Pen Needle (PEN NEEDLES) 32G X 4 MM MISC Used to give Victoza injections. 100 each 1   Lancets (ONETOUCH ULTRASOFT) lancets Used to check blood sugars once a day. 100 each 4   lisinopril (ZESTRIL) 5 MG tablet TAKE 1 TABLET BY MOUTH EVERY DAY 90 tablet 1   metFORMIN (GLUCOPHAGE) 1000 MG tablet TAKE 1 TABLET (1,000 MG TOTAL) BY MOUTH 2 (TWO) TIMES DAILY WITH A MEAL. 180 tablet  1   Multiple Vitamins-Minerals (CENTRUM SILVER ADULT 50+ PO) Take 1 tablet by mouth daily.     pantoprazole (PROTONIX) 20 MG tablet Take 20 mg by mouth daily.     Semaglutide, 1 MG/DOSE, (OZEMPIC, 1 MG/DOSE,) 2 MG/1.5ML SOPN Inject 1 mg into the skin once a week. 3 mL 3   Calcium-Vitamin D-Vitamin K (VIACTIV CALCIUM PLUS D PO) Take 1 tablet by mouth daily. (Patient not taking: Reported on 05/15/2021)     No current facility-administered medications on file prior to visit.    Social History   Tobacco Use   Smoking status: Never   Smokeless tobacco: Never  Vaping Use   Vaping Use: Never used  Substance Use Topics   Alcohol use: Not Currently   Drug use: Never    Review of  Systems  Constitutional:  Negative for chills, fever and unexpected weight change.  HENT:  Negative for congestion.   Respiratory:  Negative for cough.   Cardiovascular:  Negative for chest pain, palpitations and leg swelling.  Gastrointestinal:  Positive for vomiting. Negative for abdominal distention, abdominal pain, constipation, diarrhea and nausea.  Musculoskeletal:  Negative for arthralgias and myalgias.  Skin:  Negative for rash.  Neurological:  Negative for headaches.  Hematological:  Negative for adenopathy.  Psychiatric/Behavioral:  Negative for confusion.      Objective:    BP 112/72 (BP Location: Left Arm, Patient Position: Sitting, Cuff Size: Large)   Pulse 70   Temp 98.2 F (36.8 C) (Oral)   Ht 6' (1.829 m)   Wt 217 lb 6.4 oz (98.6 kg)   SpO2 99%   BMI 29.48 kg/m   BP Readings from Last 3 Encounters:  05/15/21 112/72  02/09/21 130/80  02/02/21 (!) 160/80   Wt Readings from Last 3 Encounters:  05/15/21 217 lb 6.4 oz (98.6 kg)  02/09/21 228 lb 12.8 oz (103.8 kg)  02/02/21 233 lb (105.7 kg)    Physical Exam Vitals reviewed.  Constitutional:      Appearance: Normal appearance. She is well-developed.  Eyes:     Conjunctiva/sclera: Conjunctivae normal.  Neck:     Thyroid: No thyroid mass or thyromegaly.  Cardiovascular:     Rate and Rhythm: Normal rate and regular rhythm.     Pulses: Normal pulses.     Heart sounds: Normal heart sounds.  Pulmonary:     Effort: Pulmonary effort is normal.     Breath sounds: Normal breath sounds. No wheezing, rhonchi or rales.  Chest:  Breasts:    Breasts are symmetrical.     Right: No inverted nipple, mass, nipple discharge, skin change or tenderness.     Left: No inverted nipple, mass, nipple discharge, skin change or tenderness.  Abdominal:     General: Bowel sounds are normal. There is no distension.     Palpations: Abdomen is soft. Abdomen is not rigid. There is no fluid wave or mass.     Tenderness: There is no  abdominal tenderness. There is no guarding or rebound.  Genitourinary:    Cervix: No cervical motion tenderness, discharge or friability.     Uterus: Not enlarged, not fixed and not tender.      Adnexa:        Right: No mass, tenderness or fullness.         Left: No mass, tenderness or fullness.       Comments: Pap performed. No CMT. Unable to appreciated ovaries. Lymphadenopathy:     Head:  Right side of head: No submental, submandibular, tonsillar, preauricular, posterior auricular or occipital adenopathy.     Left side of head: No submental, submandibular, tonsillar, preauricular, posterior auricular or occipital adenopathy.     Cervical:     Right cervical: No superficial, deep or posterior cervical adenopathy.    Left cervical: No superficial, deep or posterior cervical adenopathy.     Upper Body:     Right upper body: No pectoral adenopathy.     Left upper body: No pectoral adenopathy.  Skin:    General: Skin is warm and dry.  Neurological:     Mental Status: She is alert.  Psychiatric:        Speech: Speech normal.        Behavior: Behavior normal.        Thought Content: Thought content normal.       Assessment & Plan:   Problem List Items Addressed This Visit       Digestive   GERD (gastroesophageal reflux disease)    Uncontrolled. Suspect vomiting r/t GERD. Increase protonix to BID 33m and then wean back protonix 244mqd. Dysphagia resolved. Advised EGD in near future and particularly if vomiting continues          Endocrine   DM (diabetes mellitus) ([place]   Lab Results  Component Value Date   HGBA1C 8.8 (A) 02/09/2021  Appears improved with FBG. Continue metformin 100047mid,  ozempic 1mg58mecrease glipizide to 5mg.24m     Relevant Medications   glipiZIDE (GLUCOTROL XL) 5 MG 24 hr tablet     Other   Routine physical examination - Primary    CBE and pap smear performed. Encouraged exercise.        Relevant Medications   glipiZIDE (GLUCOTROL  XL) 5 MG 24 hr tablet   Other Relevant Orders   CBC with Differential/Platelet   Comprehensive metabolic panel   Lipid panel   VITAMIN D 25 Hydroxy (Vit-D Deficiency, Fractures)   TSH   B12 and Folate Panel   IBC + Ferritin   Cytology - PAP   Other Visit Diagnoses     Need for 23-polyvalent pneumococcal polysaccharide vaccine       Relevant Orders   Pneumococcal polysaccharide vaccine 23-valent greater than or equal to 2yo subcutaneous/IM (Completed)        I have discontinued LauriGenineld's glipiZIDE. I am also having her start on glipiZIDE. Additionally, I am having her maintain her Multiple Vitamins-Minerals (CENTRUM SILVER ADULT 50+ PO), aspirin EC, onetouch ultrasoft, Pen Needles, amLODipine, metFORMIN, lisinopril, atorvastatin, citalopram, Calcium-Vitamin D-Vitamin K (VIACTIV CALCIUM PLUS D PO), pantoprazole, Accu-Chek Aviva Plus, Accu-Chek FastClix Lancets, Accu-Chek Guide Me, and Ozempic (1 MG/DOSE).   Meds ordered this encounter  Medications   glipiZIDE (GLUCOTROL XL) 5 MG 24 hr tablet    Sig: Take 1 tablet (5 mg total) by mouth daily with breakfast.    Dispense:  90 tablet    Refill:  1    Order Specific Question:   Supervising Provider    Answer:   TULLOCrecencio [id number]]    Return precautions given.   Risks, benefits, and alternatives of the medications and treatment plan prescribed today were discussed, and patient expressed understanding.   Education regarding symptom management and diagnosis given to patient on AVS.   Continue to follow with ArnetBurnard Hawthorne for routine health maintenance.   LauriShon MilletI agreed with plan.   MargaMable Paris

## 2021-05-15 NOTE — Assessment & Plan Note (Signed)
CBE and pap smear performed. Encouraged exercise.  

## 2021-05-15 NOTE — Patient Instructions (Addendum)
Trial increase of protonix 20mg  to twice daily for a few weeks  Then try to wean back to 20mg  once per day   If no improvement, please let me know   Health Maintenance for Postmenopausal Women Menopause is a normal process in which your ability to get pregnant comes to an end. This process happens slowly over many months or years, usually between the ages of 79 and 26. Menopause is complete when you have missed your menstrualperiods for 12 months. It is important to talk with your health care provider about some of the most common conditions that affect women after menopause (postmenopausal women). These include heart disease, cancer, and bone loss (osteoporosis). Adopting a healthy lifestyle and getting preventive care can help to promote your health and wellness. The actions you take can also lower your chances ofdeveloping some of these common conditions. What should I know about menopause? During menopause, you may get a number of symptoms, such as: Hot flashes. These can be moderate or severe. Night sweats. Decrease in sex drive. Mood swings. Headaches. Tiredness. Irritability. Memory problems. Insomnia. Choosing to treat or not to treat these symptoms is a decision that you makewith your health care provider. Do I need hormone replacement therapy? Hormone replacement therapy is effective in treating symptoms that are caused by menopause, such as hot flashes and night sweats. Hormone replacement carries certain risks, especially as you become older. If you are thinking about using estrogen or estrogen with progestin, discuss the benefits and risks with your health care provider. What is my risk for heart disease and stroke? The risk of heart disease, heart attack, and stroke increases as you age. One of the causes may be a change in the body's hormones during menopause. This can affect how your body uses dietary fats, triglycerides, and cholesterol. Heart attack and stroke are medical  emergencies. There are many things that you cando to help prevent heart disease and stroke. Watch your blood pressure High blood pressure causes heart disease and increases the risk of stroke. This is more likely to develop in people who have high blood pressure readings, are of African descent, or are overweight. Have your blood pressure checked: Every 3-5 years if you are 15-9 years of age. Every year if you are 34 years old or older. Eat a healthy diet  Eat a diet that includes plenty of vegetables, fruits, low-fat dairy products, and lean protein. Do not eat a lot of foods that are high in solid fats, added sugars, or sodium.  Get regular exercise Get regular exercise. This is one of the most important things you can do for your health. Most adults should: Try to exercise for at least 150 minutes each week. The exercise should increase your heart rate and make you sweat (moderate-intensity exercise). Try to do strengthening exercises at least twice each week. Do these in addition to the moderate-intensity exercise. Spend less time sitting. Even light physical activity can be beneficial. Other tips Work with your health care provider to achieve or maintain a healthy weight. Do not use any products that contain nicotine or tobacco, such as cigarettes, e-cigarettes, and chewing tobacco. If you need help quitting, ask your health care provider. Know your numbers. Ask your health care provider to check your cholesterol and your blood sugar (glucose). Continue to have your blood tested as directed by your health care provider. Do I need screening for cancer? Depending on your health history and family history, you may need to have cancer  screening at different stages of your life. This may include screening for: Breast cancer. Cervical cancer. Lung cancer. Colorectal cancer. What is my risk for osteoporosis? After menopause, you may be at increased risk for osteoporosis. Osteoporosis is a  condition in which bone destruction happens more quickly than new bone creation. To help prevent osteoporosis or the bone fractures that can happen because of osteoporosis, you may take the following actions: If you are 64-62 years old, get at least 1,000 mg of calcium and at least 600 mg of vitamin D per day. If you are older than age 50 but younger than age 70, get at least 1,200 mg of calcium and at least 600 mg of vitamin D per day. If you are older than age 16, get at least 1,200 mg of calcium and at least 800 mg of vitamin D per day. Smoking and drinking excessive alcohol increase the risk of osteoporosis. Eat foods that are rich in calcium and vitamin D, and do weight-bearing exercisesseveral times each week as directed by your health care provider. How does menopause affect my mental health? Depression may occur at any age, but it is more common as you become older. Common symptoms of depression include: Low or sad mood. Changes in sleep patterns. Changes in appetite or eating patterns. Feeling an overall lack of motivation or enjoyment of activities that you previously enjoyed. Frequent crying spells. Talk with your health care provider if you think that you are experiencingdepression. General instructions See your health care provider for regular wellness exams and vaccines. This may include: Scheduling regular health, dental, and eye exams. Getting and maintaining your vaccines. These include: Influenza vaccine. Get this vaccine each year before the flu season begins. Pneumonia vaccine. Shingles vaccine. Tetanus, diphtheria, and pertussis (Tdap) booster vaccine. Your health care provider may also recommend other immunizations. Tell your health care provider if you have ever been abused or do not feel safeat home. Summary Menopause is a normal process in which your ability to get pregnant comes to an end. This condition causes hot flashes, night sweats, decreased interest in sex,  mood swings, headaches, or lack of sleep. Treatment for this condition may include hormone replacement therapy. Take actions to keep yourself healthy, including exercising regularly, eating a healthy diet, watching your weight, and checking your blood pressure and blood sugar levels. Get screened for cancer and depression. Make sure that you are up to date with all your vaccines. This information is not intended to replace advice given to you by your health care provider. Make sure you discuss any questions you have with your healthcare provider. Document Revised: 11/08/2018 Document Reviewed: 11/08/2018 Elsevier Patient Education  2022 [employer].

## 2021-05-15 NOTE — Assessment & Plan Note (Signed)
Lab Results  Component Value Date   HGBA1C 8.8 (A) 02/09/2021   Appears improved with FBG. Continue metformin 1000mg  bid,  ozempic 1mg . Decrease glipizide to 5mg .

## 2021-05-15 NOTE — Assessment & Plan Note (Addendum)
Uncontrolled. Suspect vomiting r/t GERD. Increase protonix to BID 20mg  and then wean back protonix 20mg  qd. Dysphagia resolved. Advised EGD in near future and particularly if vomiting continues

## 2021-05-16 ENCOUNTER — Ambulatory Visit
Admission: EM | Admit: 2021-05-16 | Discharge: 2021-05-16 | Disposition: A | Discharge status: Home / Self Care | Payer: Medicare HMO | Attending: Family Medicine | Admitting: Family Medicine

## 2021-05-16 ENCOUNTER — Other Ambulatory Visit: Payer: Self-pay

## 2021-05-16 DIAGNOSIS — T50Z95A Adverse effect of other vaccines and biological substances, initial encounter: Secondary | ICD-10-CM | POA: Diagnosis not present

## 2021-05-16 MED ORDER — PREDNISONE 50 MG PO TABS: ORAL_TABLET | ORAL | 0 refills | Status: DC

## 2021-05-16 MED ORDER — DOXYCYCLINE HYCLATE 100 MG PO CAPS: 100.0000 mg | ORAL_CAPSULE | Freq: Two times a day (BID) | ORAL | 0 refills | Status: DC

## 2021-05-16 NOTE — ED Triage Notes (Signed)
Patient complains of left arm allergic reaction to pneumonia vaccine. States that she had a similar reaction to the vaccine last year but, states that primary told her this was a different strain. Patient with significant swelling to deltoid of left arm with warm to the touch sensation. Patient states that arm is very painful.

## 2021-05-16 NOTE — Discharge Instructions (Addendum)
Rest.  Continue icing the area.  Keep an eye on your sugars.  If you worsen, please let us know.  Take care  Dr. Adriana Simas

## 2021-05-16 NOTE — ED Provider Notes (Signed)
[facility]    CSN: [id number] Arrival date & time: 05/16/21  1045      History   Chief Complaint Chief Complaint  Patient presents with   Allergic Reaction    Left arm    HPI Danielle Dickerson is a 65 y.o. female who presents with the above complaint.  Patient got pneumococcal vaccine yesterday at her primary care physician's office.  Following injection, she developed swelling of the left upper arm, warmth, and redness.  She states that the pain is quite severe, 8/10 in severity.  Described as throbbing.  She has had no fever.  She has had a previous vaccine reaction to a prior pneumococcal vaccine.  She has iced the area without resolution.  She is otherwise feeling well.  No other complaints or concerns at this time.  Past Medical History:  Diagnosis Date   Depression    Diabetes mellitus without complication ([place])    Eosinophilic esophagitis    Esophageal stricture 2019   Hyperlipidemia     Patient Active Problem List   Diagnosis Date Noted   Screening for colon cancer    Atypical squamous cell changes of undetermined significance (ASCUS) on cervical cytology with negative high risk human papilloma virus (HPV) test result 05/08/2020   HTN (hypertension) 05/06/2020   Hot flashes 05/06/2020   Routine physical examination 01/30/2020   DM (diabetes mellitus) ([place]) 01/30/2020   HLD (hyperlipidemia) 01/30/2020   GERD (gastroesophageal reflux disease) 01/30/2020   Irritable mood 01/30/2020    Past Surgical History:  Procedure Laterality Date   ABDOMINAL HYSTERECTOMY  2006   has ovaries. Had heavy periods and was anemic. HAS cervix   BREAST EXCISIONAL BIOPSY Right 2009   phyllodes removed    CHOLECYSTECTOMY OPEN     COLONOSCOPY WITH PROPOFOL N/A 02/02/2021   Procedure: COLONOSCOPY WITH PROPOFOL;  Surgeon: Lin Landsman, MD;  Location: [facility] ENDOSCOPY;  Service: Gastroenterology;  Laterality: N/A;    OB History   No obstetric history on file.       Home Medications    Prior to Admission medications   Medication Sig Start Date End Date Taking? Authorizing Provider  Accu-Chek FastClix Lancets MISC Used to check blood sugars twice a day. 03/31/21  Yes Burnard Hawthorne, FNP  amLODipine (NORVASC) 2.5 MG tablet Take 1 tablet (2.5 mg total) by mouth daily. 08/18/20  Yes Leone Haven, MD  aspirin EC 81 MG tablet Take 81 mg by mouth daily.   Yes [provider]  atorvastatin (LIPITOR) 40 MG tablet Take 1 tablet (40 mg total) by mouth daily. 01/08/21  Yes Burnard Hawthorne, FNP  Blood Glucose Monitoring Suppl (ACCU-CHEK GUIDE ME) w/Device KIT USE AS DIRECTED 03/31/21  Yes Arnett, Yvetta Coder, FNP  Calcium-Vitamin D-Vitamin K (VIACTIV CALCIUM PLUS D PO) Take 1 tablet by mouth daily.   Yes [provider]  citalopram (CELEXA) 20 MG tablet TAKE 1 TABLET BY MOUTH EVERY DAY 01/27/21  Yes Arnett, Yvetta Coder, FNP  doxycycline (VIBRAMYCIN) 100 MG capsule Take 1 capsule (100 mg total) by mouth 2 (two) times daily. 05/16/21  Yes Jocilyn Trego G, DO  glipiZIDE (GLUCOTROL XL) 5 MG 24 hr tablet Take 1 tablet (5 mg total) by mouth daily with breakfast. 05/15/21  Yes Arnett, Yvetta Coder, FNP  glucose blood (ACCU-CHEK AVIVA PLUS) test strip Used to check blood sugars twice a day. 03/31/21  Yes Burnard Hawthorne, FNP  Insulin Pen Needle (PEN NEEDLES) 32G X 4 MM MISC Used  to give Victoza injections. 08/14/20  Yes Arnett, Yvetta Coder, FNP  Lancets [facility] ULTRASOFT) lancets Used to check blood sugars once a day. 01/30/20  Yes Burnard Hawthorne, FNP  lisinopril (ZESTRIL) 5 MG tablet TAKE 1 TABLET BY MOUTH EVERY DAY 01/02/21  Yes Arnett, Yvetta Coder, FNP  metFORMIN (GLUCOPHAGE) 1000 MG tablet TAKE 1 TABLET (1,000 MG TOTAL) BY MOUTH 2 (TWO) TIMES DAILY WITH A MEAL. 11/11/20  Yes Arnett, Yvetta Coder, FNP  Multiple Vitamins-Minerals (CENTRUM SILVER ADULT 50+ PO) Take 1 tablet by mouth daily.   Yes [provider]  pantoprazole (PROTONIX) 20 MG tablet  Take 20 mg by mouth daily.   Yes [provider]  predniSONE (DELTASONE) 50 MG tablet 1 tablet daily x 5 days 05/16/21  Yes Iysis Germain G, DO  Semaglutide, 1 MG/DOSE, (OZEMPIC, 1 MG/DOSE,) 2 MG/1.5ML SOPN Inject 1 mg into the skin once a week. 04/14/21  Yes Arnett, Yvetta Coder, FNP    Family History Family History  Problem Relation Age of Onset   Congestive Heart Failure Mother    Colon cancer Father    Esophageal cancer Maternal Uncle    Thyroid cancer Neg Hx     Social History Social History   Tobacco Use   Smoking status: Never   Smokeless tobacco: Never  Vaping Use   Vaping Use: Never used  Substance Use Topics   Alcohol use: Not Currently   Drug use: Never     Allergies   Prevnar 13 [pneumococcal 13-val conj vacc]   Review of Systems Review of Systems Per HPI  Physical Exam Triage Vital Signs ED Triage Vitals  Enc Vitals Group     BP 05/16/21 1100 127/88     Pulse Rate 05/16/21 1100 79     Resp 05/16/21 1100 17     Temp 05/16/21 1100 98.2 F (36.8 C)     Temp Source 05/16/21 1100 Oral     SpO2 05/16/21 1100 99 %     Weight 05/16/21 1058 217 lb 6 oz (98.6 kg)     Height 05/16/21 1058 6' (1.829 m)     Head Circumference --      Peak Flow --      Pain Score 05/16/21 1058 8     Pain Loc --      Pain Edu? --      Excl. in [place]? --    Updated Vital Signs BP 127/88 (BP Location: Left Arm)   Pulse 79   Temp 98.2 F (36.8 C) (Oral)   Resp 17   Ht 6' (1.829 m)   Wt 98.6 kg   SpO2 99%   BMI 29.48 kg/m   Visual Acuity Right Eye Distance:   Left Eye Distance:   Bilateral Distance:    Right Eye Near:   Left Eye Near:    Bilateral Near:     Physical Exam Vitals and nursing note reviewed.  Constitutional:      General: She is not in acute distress.    Appearance: Normal appearance. She is not ill-appearing.  HENT:     Head: Normocephalic and atraumatic.  Pulmonary:     Effort: Pulmonary effort is normal. No respiratory distress.  Skin:     Comments: Left upper arm with marked swelling, warmth, and erythema.  Tenderness to palpation.  Neurological:     Mental Status: She is alert.     UC Treatments / Results  Labs (all labs ordered are listed, but only abnormal results are  displayed) Labs Reviewed - No data to display  EKG   Radiology No results found.  Procedures Procedures (including critical care time)  Medications Ordered in UC Medications - No data to display  Initial Impression / Assessment and Plan / UC Course  I have reviewed the triage vital signs and the nursing notes.  Pertinent labs & imaging results that were available during my care of the patient were reviewed by me and considered in my medical decision making (see chart for details).    65 year old female presents with a vaccine reaction.  Concern for possible developing cellulitis.  Placing on prednisone and doxycycline.    Final Clinical Impressions(s) / UC Diagnoses   Final diagnoses:  Vaccine reaction, initial encounter     Discharge Instructions      Rest.  Continue icing the area.  Keep an eye on your sugars.  If you worsen, please let us know.  Take care  Dr. Lacinda Axon    ED Prescriptions     Medication Sig Dispense Auth. Provider   predniSONE (DELTASONE) 50 MG tablet 1 tablet daily x 5 days 5 tablet Ndea Kilroy G, DO   doxycycline (VIBRAMYCIN) 100 MG capsule Take 1 capsule (100 mg total) by mouth 2 (two) times daily. 14 capsule Thersa Salt G, DO      PDMP not reviewed this encounter.   Coral Spikes, [place] 05/16/21 1126

## 2021-05-17 ENCOUNTER — Encounter: Payer: Self-pay | Admitting: Family

## 2021-05-18 ENCOUNTER — Telehealth: Payer: Self-pay

## 2021-05-18 ENCOUNTER — Other Ambulatory Visit: Payer: Self-pay | Admitting: Family

## 2021-05-18 DIAGNOSIS — K219 Gastro-esophageal reflux disease without esophagitis: Secondary | ICD-10-CM

## 2021-05-18 MED ORDER — PANTOPRAZOLE SODIUM 20 MG PO TBEC: 20.0000 mg | DELAYED_RELEASE_TABLET | Freq: Two times a day (BID) | ORAL | 1 refills | Status: DC

## 2021-05-18 NOTE — Telephone Encounter (Signed)
noted 

## 2021-05-18 NOTE — Telephone Encounter (Signed)
Patient called into access nurse on 05/16/21 at 9:22am. Pt c/o reaction to a PNE vaccine. States that her arm is hard, swollen, and hurts. Pt was instructed to be seen within 4 hours. Pt was seen in the ED at 05/16/21.

## 2021-05-18 NOTE — Telephone Encounter (Signed)
FYI see mychart. Patient went to UC. She told me when she had pneumococcal 23 that this happened, but thought it was more injection site related, but obviously not.

## 2021-05-18 NOTE — Telephone Encounter (Signed)
Appears UC added to her chart she is allergic to vaccine. I will add the pneumococcal 23 as well.

## 2021-05-19 LAB — CYTOLOGY - PAP
Comment: NEGATIVE
High risk HPV: NEGATIVE

## 2021-05-25 ENCOUNTER — Other Ambulatory Visit: Payer: Self-pay | Admitting: Family

## 2021-05-25 ENCOUNTER — Telehealth: Payer: Self-pay

## 2021-05-25 DIAGNOSIS — R87619 Unspecified abnormal cytological findings in specimens from cervix uteri: Secondary | ICD-10-CM

## 2021-05-25 NOTE — Telephone Encounter (Signed)
LMTCB for lab results.  

## 2021-05-26 ENCOUNTER — Other Ambulatory Visit: Payer: Medicare HMO

## 2021-05-26 ENCOUNTER — Encounter: Payer: Self-pay | Admitting: Family

## 2021-05-27 NOTE — Telephone Encounter (Signed)
PT called to talk to Maralyn Sago in regards to [place] where she has just tested for covid. She would like a callback.

## 2021-05-28 ENCOUNTER — Telehealth (INDEPENDENT_AMBULATORY_CARE_PROVIDER_SITE_OTHER): Payer: Medicare HMO | Admitting: Internal Medicine

## 2021-05-28 ENCOUNTER — Other Ambulatory Visit: Payer: Self-pay

## 2021-05-28 ENCOUNTER — Encounter: Payer: Self-pay | Admitting: Internal Medicine

## 2021-05-28 ENCOUNTER — Telehealth: Payer: Medicare HMO | Admitting: Internal Medicine

## 2021-05-28 VITALS — Ht 72.0 in | Wt 217.0 lb

## 2021-05-28 DIAGNOSIS — U071 COVID-19: Secondary | ICD-10-CM

## 2021-05-28 DIAGNOSIS — J0111 Acute recurrent frontal sinusitis: Secondary | ICD-10-CM

## 2021-05-28 MED ORDER — MOLNUPIRAVIR EUA 200MG CAPSULE
4.0000 | ORAL_CAPSULE | Freq: Two times a day (BID) | ORAL | 0 refills | Status: AC
Start: 2021-05-28 — End: 2021-06-02

## 2021-05-28 MED ORDER — AZITHROMYCIN 250 MG PO TABS: ORAL_TABLET | ORAL | 0 refills | Status: AC

## 2021-05-28 NOTE — Telephone Encounter (Signed)
Pt called and asked to speak with you to continue your conversation about covid.

## 2021-05-28 NOTE — Progress Notes (Signed)
Tested positive for [employer]. Woke up Tuesday morning with sore throat, runny nose, sneezing, coughing, fatigue, nasal congestion.  Went to a wedding in [place] this past weekend.

## 2021-05-28 NOTE — Progress Notes (Signed)
Virtual Visit via Video Note  I connected Jeaninne Lodico  on 05/28/21 at  2:30 PM EDT by a video enabled telemedicine application and verified that I am speaking with the correct person using two identifiers.  Location patient: home, [place] Location provider:work or home office Persons participating in the virtual visit: patient, provider  I discussed the limitations of evaluation and management by telemedicine and the availability of in person appointments. The patient expressed understanding and agreed to proceed.   HPI:  Acute telemedicine visit for : Covid 19+ with mild cough, congestion, runny nose, sneezing, fatigue, sinus pressure and head pressure and ear pain sore throat she went to [place] wedding over the weekend in [place] tested + covid 05/27/21  Tried Dayquil w/o relief   -COVID-19 vaccine status: 4/4 pfizer   ROS: See pertinent positives and negatives per HPI.  Past Medical History:  Diagnosis Date   COVID-19    05/27/21   Depression    Diabetes mellitus without complication ([place])    Eosinophilic esophagitis    Esophageal stricture 2019   Hyperlipidemia     Past Surgical History:  Procedure Laterality Date   ABDOMINAL HYSTERECTOMY  2006   has ovaries. Had heavy periods and was anemic. HAS cervix   BREAST EXCISIONAL BIOPSY Right 2009   phyllodes removed    CHOLECYSTECTOMY OPEN     COLONOSCOPY WITH PROPOFOL N/A 02/02/2021   Procedure: COLONOSCOPY WITH PROPOFOL;  Surgeon: Lin Landsman, MD;  Location: [facility] ENDOSCOPY;  Service: Gastroenterology;  Laterality: N/A;     Current Outpatient Medications:    Accu-Chek FastClix Lancets MISC, Used to check blood sugars twice a day., Disp: 100 each, Rfl: 3   amLODipine (NORVASC) 2.5 MG tablet, Take 1 tablet (2.5 mg total) by mouth daily., Disp: 90 tablet, Rfl: 3   aspirin EC 81 MG tablet, Take 81 mg by mouth daily., Disp: , Rfl:    atorvastatin (LIPITOR) 40 MG tablet, Take 1 tablet (40 mg total) by mouth daily., Disp: 90  tablet, Rfl: 1   azithromycin (ZITHROMAX) 250 MG tablet, With food Take 2 tablets on day 1, then 1 tablet daily on days 2 through 5, Disp: 6 tablet, Rfl: 0   Blood Glucose Monitoring Suppl (ACCU-CHEK GUIDE ME) w/Device KIT, USE AS DIRECTED, Disp: 1 kit, Rfl: 0   Calcium-Vitamin D-Vitamin K (VIACTIV CALCIUM PLUS D PO), Take 1 tablet by mouth daily., Disp: , Rfl:    citalopram (CELEXA) 20 MG tablet, TAKE 1 TABLET BY MOUTH EVERY DAY, Disp: 90 tablet, Rfl: 2   glipiZIDE (GLUCOTROL XL) 5 MG 24 hr tablet, Take 1 tablet (5 mg total) by mouth daily with breakfast., Disp: 90 tablet, Rfl: 1   glucose blood (ACCU-CHEK AVIVA PLUS) test strip, Used to check blood sugars twice a day., Disp: 100 each, Rfl: 5   Insulin Pen Needle (PEN NEEDLES) 32G X 4 MM MISC, Used to give Victoza injections., Disp: 100 each, Rfl: 1   Lancets (ONETOUCH ULTRASOFT) lancets, Used to check blood sugars once a day., Disp: 100 each, Rfl: 4   lisinopril (ZESTRIL) 5 MG tablet, TAKE 1 TABLET BY MOUTH EVERY DAY, Disp: 90 tablet, Rfl: 1   metFORMIN (GLUCOPHAGE) 1000 MG tablet, TAKE 1 TABLET (1,000 MG TOTAL) BY MOUTH 2 (TWO) TIMES DAILY WITH A MEAL., Disp: 180 tablet, Rfl: 1   molnupiravir EUA 200 mg CAPS, Take 4 capsules (800 mg total) by mouth 2 (two) times daily for 5 days., Disp: 40 capsule, Rfl: 0   Multiple  Vitamins-Minerals (CENTRUM SILVER ADULT 50+ PO), Take 1 tablet by mouth daily., Disp: , Rfl:    pantoprazole (PROTONIX) 20 MG tablet, Take 1 tablet (20 mg total) by mouth 2 (two) times daily before a meal., Disp: 120 tablet, Rfl: 1   Semaglutide, 1 MG/DOSE, (OZEMPIC, 1 MG/DOSE,) 2 MG/1.5ML SOPN, Inject 1 mg into the skin once a week., Disp: 3 mL, Rfl: 3   doxycycline (VIBRAMYCIN) 100 MG capsule, Take 1 capsule (100 mg total) by mouth 2 (two) times daily., Disp: 14 capsule, Rfl: 0   predniSONE (DELTASONE) 50 MG tablet, 1 tablet daily x 5 days, Disp: 5 tablet, Rfl: 0  EXAM:  VITALS per patient if applicable:  GENERAL: alert,  oriented, appears well and in no acute distress  HEENT: atraumatic, conjunttiva clear, no obvious abnormalities on inspection of external nose and ears  NECK: normal movements of the head and neck  LUNGS: on inspection no signs of respiratory distress, breathing rate appears normal, no obvious gross SOB, gasping or wheezing  CV: no obvious cyanosis  MS: moves all visible extremities without noticeable abnormality  PSYCH/NEURO: pleasant and cooperative, no obvious depression or anxiety, speech and thought processing grossly intact  ASSESSMENT AND PLAN:  Discussed the following assessment and plan:  COVID-19 - Plan: molnupiravir EUA 200 mg CAPS, azithromycin (ZITHROMAX) 250 MG tablet  Acute recurrent frontal sinusitis - Plan: azithromycin (ZITHROMAX) 250 MG tablet  -we discussed possible serious and likely etiologies, options for evaluation and workup, limitations of telemedicine visit vs in person visit, treatment, treatment risks and precautions. Pt prefers to treat via telemedicine empirically rather than in person at this moment.    advised to schedule follow up visit with PCP or UCC if any further questions or concerns to avoid delays in care.   I discussed the assessment and treatment plan with the patient. The patient was provided an opportunity to ask questions and all were answered. The patient agreed with the plan and demonstrated an understanding of the instructions.    Time spent 20 min Delorise Jackson, MD

## 2021-05-28 NOTE — Patient Instructions (Addendum)
These are over the counter medication options:  Mucinex dm green label for cough or robitussin DM Vitamin C 1000 mg daily total  Vitamin D3 4000 Iu (units) daily total Zinc 100 mg daily.  Quercetin 250-500 mg 2 times per day   Elderberry  Oil of oregano  cepacol or chloroseptic spray  Warm salt water gargles  Warm tea with honey and lemon  Hydration  Try to eat though you dont feel like it   Tylenol or Advil  Nasal saline and Flonase over the counter 2 sprays    Monitor pulse oximeter, buy from [place] if oxygen is less than 90 please go to the hospital.        Are you feeling really sick? Shortness of breath, cough, chest pain?, dizziness? Confusion   If so let me know  If worsening, go to hospital or [facility] clinic Urgent care for further treatment.

## 2021-06-10 ENCOUNTER — Telehealth: Payer: Self-pay

## 2021-06-10 NOTE — Telephone Encounter (Signed)
We received fax from [facility] that Pantoprazole 20mg  was approved 03/29/21-11/28/21.

## 2021-06-16 ENCOUNTER — Other Ambulatory Visit: Payer: Self-pay

## 2021-06-16 ENCOUNTER — Encounter: Payer: Self-pay | Admitting: Family

## 2021-06-16 ENCOUNTER — Other Ambulatory Visit (INDEPENDENT_AMBULATORY_CARE_PROVIDER_SITE_OTHER): Payer: Medicare HMO

## 2021-06-16 DIAGNOSIS — Z0000 Encounter for general adult medical examination without abnormal findings: Secondary | ICD-10-CM

## 2021-06-16 LAB — LIPID PANEL
Cholesterol: 107 mg/dL (ref 0–200)
HDL: 44 mg/dL (ref >39.00)
LDL Cholesterol: 50 mg/dL (ref 0–99)
NonHDL: 62.55
Total CHOL/HDL Ratio: 2
Triglycerides: 63 mg/dL (ref 0.0–149.0)
VLDL: 12.6 mg/dL (ref 0.0–40.0)

## 2021-06-16 LAB — CBC WITH DIFFERENTIAL/PLATELET
Basophils Absolute: 0 10*3/uL (ref 0.0–0.1)
Basophils Relative: 0.4 % (ref 0.0–3.0)
Eosinophils Absolute: 0.1 10*3/uL (ref 0.0–0.7)
Eosinophils Relative: 2.4 % (ref 0.0–5.0)
HCT: 34.6 % — ABNORMAL LOW (ref 36.0–46.0)
Hemoglobin: 12 g/dL (ref 12.0–15.0)
Lymphocytes Relative: 32.2 % (ref 12.0–46.0)
Lymphs Abs: 1.9 10*3/uL (ref 0.7–4.0)
MCHC: 34.7 g/dL (ref 30.0–36.0)
MCV: 82.7 fl (ref 78.0–100.0)
Monocytes Absolute: 0.4 10*3/uL (ref 0.1–1.0)
Monocytes Relative: 6.8 % (ref 3.0–12.0)
Neutro Abs: 3.4 10*3/uL (ref 1.4–7.7)
Neutrophils Relative %: 58.2 % (ref 43.0–77.0)
Platelets: 188 10*3/uL (ref 150.0–400.0)
RBC: 4.19 Mil/uL (ref 3.87–5.11)
RDW: 14.9 % (ref 11.5–15.5)
WBC: 5.8 10*3/uL (ref 4.0–10.5)

## 2021-06-16 LAB — COMPREHENSIVE METABOLIC PANEL
ALT: 15 U/L (ref 0–35)
AST: 16 U/L (ref 0–37)
Albumin: 4.1 g/dL (ref 3.5–5.2)
Alkaline Phosphatase: 75 U/L (ref 39–117)
BUN: 16 mg/dL (ref 6–23)
CO2: 28 mEq/L (ref 19–32)
Calcium: 9.1 mg/dL (ref 8.4–10.5)
Chloride: 103 mEq/L (ref 96–112)
Creatinine, Ser: 0.73 mg/dL (ref 0.40–1.20)
GFR: 86.45 mL/min (ref >60.00)
Glucose, Bld: 128 mg/dL — ABNORMAL HIGH (ref 70–99)
Potassium: 3.9 mEq/L (ref 3.5–5.1)
Sodium: 139 mEq/L (ref 135–145)
Total Bilirubin: 0.5 mg/dL (ref 0.2–1.2)
Total Protein: 6.5 g/dL (ref 6.0–8.3)

## 2021-06-16 LAB — B12 AND FOLATE PANEL
Folate: >24.4 ng/mL (ref >5.9)
Vitamin B-12: 124 pg/mL — ABNORMAL LOW (ref 211–911)

## 2021-06-16 LAB — IBC + FERRITIN
Ferritin: 23.2 ng/mL (ref 10.0–291.0)
Iron: 76 ug/dL (ref 42–145)
Saturation Ratios: 21.1 % (ref 20.0–50.0)
Transferrin: 257 mg/dL (ref 212.0–360.0)

## 2021-06-16 LAB — TSH: TSH: 1.48 u[IU]/mL (ref 0.35–5.50)

## 2021-06-16 LAB — VITAMIN D 25 HYDROXY (VIT D DEFICIENCY, FRACTURES): VITD: 47.77 ng/mL (ref 30.00–100.00)

## 2021-06-17 ENCOUNTER — Other Ambulatory Visit: Payer: Self-pay

## 2021-06-17 DIAGNOSIS — E119 Type 2 diabetes mellitus without complications: Secondary | ICD-10-CM

## 2021-06-22 ENCOUNTER — Other Ambulatory Visit: Payer: Self-pay | Admitting: Family

## 2021-06-22 DIAGNOSIS — E538 Deficiency of other specified B group vitamins: Secondary | ICD-10-CM

## 2021-06-23 ENCOUNTER — Telehealth: Payer: Self-pay

## 2021-06-23 NOTE — Telephone Encounter (Signed)
LMTCB for lab results.  

## 2021-06-24 ENCOUNTER — Other Ambulatory Visit: Payer: Medicare HMO

## 2021-06-26 ENCOUNTER — Other Ambulatory Visit: Payer: Self-pay | Admitting: Family

## 2021-06-29 ENCOUNTER — Ambulatory Visit (INDEPENDENT_AMBULATORY_CARE_PROVIDER_SITE_OTHER): Payer: Medicare HMO | Admitting: Family

## 2021-06-29 ENCOUNTER — Encounter: Payer: Self-pay | Admitting: Family

## 2021-06-29 ENCOUNTER — Other Ambulatory Visit: Payer: Self-pay

## 2021-06-29 VITALS — BP 114/72 | HR 75 | Temp 98.7°F | Ht 72.0 in | Wt 211.4 lb

## 2021-06-29 DIAGNOSIS — E785 Hyperlipidemia, unspecified: Secondary | ICD-10-CM | POA: Diagnosis not present

## 2021-06-29 DIAGNOSIS — E538 Deficiency of other specified B group vitamins: Secondary | ICD-10-CM

## 2021-06-29 DIAGNOSIS — E119 Type 2 diabetes mellitus without complications: Secondary | ICD-10-CM | POA: Diagnosis not present

## 2021-06-29 DIAGNOSIS — I10 Essential (primary) hypertension: Secondary | ICD-10-CM | POA: Diagnosis not present

## 2021-06-29 DIAGNOSIS — K219 Gastro-esophageal reflux disease without esophagitis: Secondary | ICD-10-CM

## 2021-06-29 LAB — POCT GLYCOSYLATED HEMOGLOBIN (HGB A1C): Hemoglobin A1C: 6.3 % — AB (ref 4.0–5.6)

## 2021-06-29 MED ORDER — CYANOCOBALAMIN 1000 MCG/ML IJ SOLN
1000.0000 ug | Freq: Once | INTRAMUSCULAR | Status: AC
  Administered 2021-06-29: 1000 ug via INTRAMUSCULAR

## 2021-06-29 NOTE — Patient Instructions (Addendum)
Vitamin D goal - cholecalciferol 800 units daily. You may find this over the counter in the drug store. Please call to make an appointment for 3 to 4 months out so we can recheck. Also, please ensure you are following a diet high in calcium -- research shows better outcomes with dietary sources including kale, yogurt, broccolii, cheese, okra, almonds- to name a few.     Nice to see you!

## 2021-06-29 NOTE — Assessment & Plan Note (Signed)
Chronic stable, continue Lipitor 40 mg

## 2021-06-29 NOTE — Assessment & Plan Note (Signed)
Chronic, stable.  Continue Protonix 40 mg once daily.

## 2021-06-29 NOTE — Progress Notes (Signed)
Subjective:    Patient ID: Danielle Dickerson, female    DOB: September 03, 1956, 65 y.o.   MRN: [id number]  CC: Danielle Dickerson is a 65 y.o. female who presents today for follow up.   HPI: Feels well today No complaints    Diabetes-compliant with metformin 1000 mg twice daily, Ozempic 1 mg, glipizide 5 mg. Tolerating ozempic without nausea.  She is pleased with weight loss.  Gerd- compliant with protonix 13m qd. No epigastric burning, belching.  Hyperlipidemia-compliant with Lipitor 40 mg  Hypertension-compliant with amlodipine 2.5 mg, lisinopril 5 mg. no chest pain    HISTORY:  Past Medical History:  Diagnosis Date   COVID-19    05/27/21   Depression    Diabetes mellitus without complication ([place]    Eosinophilic esophagitis    Esophageal stricture 2019   Hyperlipidemia    Past Surgical History:  Procedure Laterality Date   ABDOMINAL HYSTERECTOMY  2006   has ovaries. Had heavy periods and was anemic. HAS cervix   BREAST EXCISIONAL BIOPSY Right 2009   phyllodes removed    CHOLECYSTECTOMY OPEN     COLONOSCOPY WITH PROPOFOL N/A 02/02/2021   Procedure: COLONOSCOPY WITH PROPOFOL;  Surgeon: Danielle Landsman MD;  [facility];  Service: Gastroenterology;  Laterality: N/A;   Family History  Problem Relation Age of Onset   Congestive Heart Failure Mother    Colon cancer Father    Esophageal cancer Maternal Uncle    Thyroid cancer Neg Hx     Allergies: Pneumovax 23 [pneumococcal vac polyvalent] and Prevnar 13 [pneumococcal 13-val conj vacc] Current Outpatient Medications on File Prior to Visit  Medication Sig Dispense Refill   Accu-Chek FastClix Lancets MISC Used to check blood sugars twice a day. 100 each 3   amLODipine (NORVASC) 2.5 MG tablet Take 1 tablet (2.5 mg total) by mouth daily. 90 tablet 3   aspirin EC 81 MG tablet Take 81 mg by mouth daily.     atorvastatin (LIPITOR) 40 MG tablet Take 1 tablet (40 mg total) by mouth daily. 90 tablet 1   Blood Glucose  Monitoring Suppl (ACCU-CHEK GUIDE ME) w/Device KIT USE AS DIRECTED 1 kit 0   Calcium-Vitamin D-Vitamin K (VIACTIV CALCIUM PLUS D PO) Take 1 tablet by mouth daily.     citalopram (CELEXA) 20 MG tablet TAKE 1 TABLET BY MOUTH EVERY DAY 90 tablet 2   glipiZIDE (GLUCOTROL XL) 5 MG 24 hr tablet Take 1 tablet (5 mg total) by mouth daily with breakfast. 90 tablet 1   glucose blood (ACCU-CHEK AVIVA PLUS) test strip Used to check blood sugars twice a day. 100 each 5   Insulin Pen Needle (PEN NEEDLES) 32G X 4 MM MISC Used to give Victoza injections. 100 each 1   Lancets (ONETOUCH ULTRASOFT) lancets Used to check blood sugars once a day. 100 each 4   lisinopril (ZESTRIL) 5 MG tablet TAKE 1 TABLET BY MOUTH EVERY DAY 90 tablet 1   metFORMIN (GLUCOPHAGE) 1000 MG tablet TAKE 1 TABLET (1,000 MG TOTAL) BY MOUTH 2 (TWO) TIMES DAILY WITH A MEAL. 180 tablet 1   Multiple Vitamins-Minerals (CENTRUM SILVER ADULT 50+ PO) Take 1 tablet by mouth daily.     pantoprazole (PROTONIX) 20 MG tablet Take 1 tablet (20 mg total) by mouth 2 (two) times daily before a meal. 120 tablet 1   Semaglutide, 1 MG/DOSE, (OZEMPIC, 1 MG/DOSE,) 2 MG/1.5ML SOPN Inject 1 mg into the skin once a week. 3 mL 3   No current facility-administered  medications on file prior to visit.    Social History   Tobacco Use   Smoking status: Never   Smokeless tobacco: Never  Vaping Use   Vaping Use: Never used  Substance Use Topics   Alcohol use: Not Currently   Drug use: Never    Review of Systems  Constitutional:  Negative for chills and fever.  Respiratory:  Negative for cough.   Cardiovascular:  Negative for chest pain and palpitations.  Gastrointestinal:  Negative for nausea and vomiting.     Objective:    BP 114/72 (BP Location: Left Arm, Patient Position: Sitting, Cuff Size: Large)   Pulse 75   Temp 98.7 F (37.1 C) (Oral)   Ht 6' (1.829 m)   Wt 211 lb 6.4 oz (95.9 kg)   SpO2 98%   BMI 28.67 kg/m  BP Readings from Last 3  Encounters:  06/29/21 114/72  05/16/21 127/88  05/15/21 112/72   Wt Readings from Last 3 Encounters:  06/29/21 211 lb 6.4 oz (95.9 kg)  05/28/21 217 lb (98.4 kg)  05/16/21 217 lb 6 oz (98.6 kg)    Physical Exam Vitals reviewed.  Constitutional:      Appearance: She is well-developed.  Eyes:     Conjunctiva/sclera: Conjunctivae normal.  Cardiovascular:     Rate and Rhythm: Normal rate and regular rhythm.     Pulses: Normal pulses.     Heart sounds: Normal heart sounds.  Pulmonary:     Effort: Pulmonary effort is normal.     Breath sounds: Normal breath sounds. No wheezing, rhonchi or rales.  Skin:    General: Skin is warm and dry.  Neurological:     Mental Status: She is alert.  Psychiatric:        Speech: Speech normal.        Behavior: Behavior normal.        Thought Content: Thought content normal.       Assessment & Plan:   Problem List Items Addressed This Visit       Cardiovascular and Mediastinum   HTN (hypertension)    Chronic, stable, continue  amlodipine 2.5 mg, lisinopril 5 mg         Digestive   GERD (gastroesophageal reflux disease)    Chronic, stable.  Continue Protonix 40 mg once daily.         Endocrine   DM (diabetes mellitus) ([place]) - Primary    Lab Results  Component Value Date   HGBA1C 6.3 (A) 06/29/2021  Excellent control.  Continue metformin 1000 mg twice daily, Ozempic 1 mg, glipizide 5 mg.  Counseled patient on importance of eating breakfast with glipizide as this medication can cause hypoglycemia.  Continue regimen for now      Relevant Orders   POCT HgB A1C (Completed)     Other   HLD (hyperlipidemia)    Chronic stable, continue Lipitor 40 mg       Other Visit Diagnoses     B12 deficiency       Relevant Medications   cyanocobalamin ((VITAMIN B-12)) injection 1,000 mcg (Completed)        I have discontinued Danielle Dickerson's predniSONE and doxycycline. I am also having her maintain her Multiple Vitamins-Minerals  (CENTRUM SILVER ADULT 50+ PO), aspirin EC, onetouch ultrasoft, Pen Needles, amLODipine, metFORMIN, lisinopril, atorvastatin, citalopram, Calcium-Vitamin D-Vitamin K (VIACTIV CALCIUM PLUS D PO), Accu-Chek Aviva Plus, Accu-Chek FastClix Lancets, Accu-Chek Guide Me, Ozempic (1 MG/DOSE), glipiZIDE, and pantoprazole. We administered cyanocobalamin.   Meds  ordered this encounter  Medications   cyanocobalamin ((VITAMIN B-12)) injection 1,000 mcg    Return precautions given.   Risks, benefits, and alternatives of the medications and treatment plan prescribed today were discussed, and patient expressed understanding.   Education regarding symptom management and diagnosis given to patient on AVS.  Continue to follow with Burnard Hawthorne, FNP for routine health maintenance.   [name] and I agreed with plan.   Mable Paris, FNP

## 2021-06-29 NOTE — Assessment & Plan Note (Signed)
Chronic, stable, continue  amlodipine 2.5 mg, lisinopril 5 mg

## 2021-06-29 NOTE — Assessment & Plan Note (Addendum)
Lab Results  Component Value Date   HGBA1C 6.3 (A) 06/29/2021   Excellent control.  Continue metformin 1000 mg twice daily, Ozempic 1 mg, glipizide 5 mg.  Counseled patient on importance of eating breakfast with glipizide as this medication can cause hypoglycemia.  Continue regimen for now

## 2021-07-01 ENCOUNTER — Other Ambulatory Visit: Payer: Self-pay | Admitting: Family

## 2021-07-02 ENCOUNTER — Other Ambulatory Visit: Payer: Self-pay | Admitting: Family

## 2021-07-02 LAB — CELIAC DISEASE AB SCREEN W/RFX
Antigliadin Abs, IgA: 3 units (ref 0–19)
IgA/Immunoglobulin A, Serum: 97 mg/dL (ref 87–352)
Transglutaminase IgA: <2 U/mL (ref 0–3)

## 2021-07-02 LAB — METHYLMALONIC ACID, SERUM: Methylmalonic Acid, Quant: 155 nmol/L (ref 87–318)

## 2021-07-02 LAB — ANTI-PARIETAL ANTIBODY: PARIETAL CELL AB SCREEN: NEGATIVE

## 2021-07-02 LAB — INTRINSIC FACTOR ANTIBODIES: Intrinsic Factor: POSITIVE — AB

## 2021-07-02 LAB — HOMOCYSTEINE: Homocysteine: 9.8 umol/L (ref <10.4)

## 2021-07-03 ENCOUNTER — Encounter: Payer: Self-pay | Admitting: Family

## 2021-07-03 ENCOUNTER — Other Ambulatory Visit: Payer: Self-pay | Admitting: Family

## 2021-07-03 DIAGNOSIS — D510 Vitamin B12 deficiency anemia due to intrinsic factor deficiency: Secondary | ICD-10-CM

## 2021-07-06 ENCOUNTER — Encounter: Payer: Self-pay | Admitting: *Deleted

## 2021-07-06 ENCOUNTER — Other Ambulatory Visit: Payer: Self-pay | Admitting: Family

## 2021-07-07 ENCOUNTER — Ambulatory Visit (INDEPENDENT_AMBULATORY_CARE_PROVIDER_SITE_OTHER): Payer: Medicare HMO

## 2021-07-07 ENCOUNTER — Other Ambulatory Visit: Payer: Self-pay

## 2021-07-07 DIAGNOSIS — E538 Deficiency of other specified B group vitamins: Secondary | ICD-10-CM

## 2021-07-07 MED ORDER — CYANOCOBALAMIN 1000 MCG/ML IJ SOLN
1000.0000 ug | Freq: Once | INTRAMUSCULAR | Status: AC
  Administered 2021-07-07: 1000 ug via INTRAMUSCULAR

## 2021-07-07 NOTE — Progress Notes (Signed)
Patient presented for B 12 injection to right deltoid, patient voiced no concerns nor showed any signs of distress during injection. 

## 2021-07-08 ENCOUNTER — Encounter: Payer: Self-pay | Admitting: Family

## 2021-07-14 ENCOUNTER — Other Ambulatory Visit: Payer: Self-pay | Admitting: Family

## 2021-07-14 ENCOUNTER — Ambulatory Visit (INDEPENDENT_AMBULATORY_CARE_PROVIDER_SITE_OTHER): Payer: Medicare HMO

## 2021-07-14 ENCOUNTER — Other Ambulatory Visit: Payer: Self-pay

## 2021-07-14 DIAGNOSIS — E538 Deficiency of other specified B group vitamins: Secondary | ICD-10-CM

## 2021-07-14 MED ORDER — CYANOCOBALAMIN 1000 MCG/ML IJ SOLN
1000.0000 ug | Freq: Once | INTRAMUSCULAR | Status: AC
  Administered 2021-07-14: 1000 ug via INTRAMUSCULAR

## 2021-07-14 NOTE — Progress Notes (Signed)
Patient presenting for weekly b12 injection. Given in the left deltoid. Patient tolerated well.   

## 2021-07-21 ENCOUNTER — Ambulatory Visit: Payer: Medicare HMO

## 2021-07-22 ENCOUNTER — Other Ambulatory Visit: Payer: Self-pay

## 2021-07-22 ENCOUNTER — Ambulatory Visit (INDEPENDENT_AMBULATORY_CARE_PROVIDER_SITE_OTHER): Payer: Medicare HMO

## 2021-07-22 DIAGNOSIS — E538 Deficiency of other specified B group vitamins: Secondary | ICD-10-CM

## 2021-07-22 MED ORDER — CYANOCOBALAMIN 1000 MCG/ML IJ SOLN
1000.0000 ug | Freq: Once | INTRAMUSCULAR | Status: AC
  Administered 2021-07-22: 1000 ug via INTRAMUSCULAR

## 2021-07-22 NOTE — Progress Notes (Signed)
Patient presented for B 12 injection to right deltoid, patient voiced no concerns nor showed any signs of distress during injection. 

## 2021-07-27 ENCOUNTER — Encounter: Payer: Self-pay | Admitting: Family

## 2021-08-16 ENCOUNTER — Emergency Department
Admission: EM | Admit: 2021-08-16 | Discharge: 2021-08-17 | Disposition: A | Discharge status: Home / Self Care | Payer: Medicare HMO | Attending: Emergency Medicine | Admitting: Emergency Medicine

## 2021-08-16 ENCOUNTER — Other Ambulatory Visit: Payer: Self-pay

## 2021-08-16 ENCOUNTER — Telehealth: Payer: Medicare HMO | Admitting: Emergency Medicine

## 2021-08-16 DIAGNOSIS — N23 Unspecified renal colic: Secondary | ICD-10-CM | POA: Diagnosis not present

## 2021-08-16 DIAGNOSIS — Z7984 Long term (current) use of oral hypoglycemic drugs: Secondary | ICD-10-CM | POA: Diagnosis not present

## 2021-08-16 DIAGNOSIS — Z8616 Personal history of COVID-19: Secondary | ICD-10-CM | POA: Insufficient documentation

## 2021-08-16 DIAGNOSIS — R112 Nausea with vomiting, unspecified: Secondary | ICD-10-CM | POA: Diagnosis not present

## 2021-08-16 DIAGNOSIS — Z794 Long term (current) use of insulin: Secondary | ICD-10-CM | POA: Insufficient documentation

## 2021-08-16 DIAGNOSIS — Z20822 Contact with and (suspected) exposure to covid-19: Secondary | ICD-10-CM | POA: Insufficient documentation

## 2021-08-16 DIAGNOSIS — E119 Type 2 diabetes mellitus without complications: Secondary | ICD-10-CM | POA: Diagnosis not present

## 2021-08-16 DIAGNOSIS — I10 Essential (primary) hypertension: Secondary | ICD-10-CM | POA: Diagnosis not present

## 2021-08-16 DIAGNOSIS — R109 Unspecified abdominal pain: Secondary | ICD-10-CM | POA: Diagnosis present

## 2021-08-16 DIAGNOSIS — Z7982 Long term (current) use of aspirin: Secondary | ICD-10-CM | POA: Insufficient documentation

## 2021-08-16 DIAGNOSIS — Z79899 Other long term (current) drug therapy: Secondary | ICD-10-CM | POA: Diagnosis not present

## 2021-08-16 DIAGNOSIS — N200 Calculus of kidney: Secondary | ICD-10-CM | POA: Diagnosis not present

## 2021-08-16 LAB — COMPREHENSIVE METABOLIC PANEL
ALT: 21 U/L (ref 0–44)
AST: 25 U/L (ref 15–41)
Albumin: 4.2 g/dL (ref 3.5–5.0)
Alkaline Phosphatase: 79 U/L (ref 38–126)
Anion gap: 9 (ref 5–15)
BUN: 16 mg/dL (ref 8–23)
CO2: 23 mmol/L (ref 22–32)
Calcium: 9.5 mg/dL (ref 8.9–10.3)
Chloride: 105 mmol/L (ref 98–111)
Creatinine, Ser: 0.71 mg/dL (ref 0.44–1.00)
GFR, Estimated: >60 mL/min (ref >60)
Glucose, Bld: 193 mg/dL — ABNORMAL HIGH (ref 70–99)
Potassium: 3.7 mmol/L (ref 3.5–5.1)
Sodium: 137 mmol/L (ref 135–145)
Total Bilirubin: 0.5 mg/dL (ref 0.3–1.2)
Total Protein: 7.1 g/dL (ref 6.5–8.1)

## 2021-08-16 LAB — CBC
HCT: 34.4 % — ABNORMAL LOW (ref 36.0–46.0)
Hemoglobin: 12.3 g/dL (ref 12.0–15.0)
MCH: 30 pg (ref 26.0–34.0)
MCHC: 35.8 g/dL (ref 30.0–36.0)
MCV: 83.9 fL (ref 80.0–100.0)
Platelets: 194 10*3/uL (ref 150–400)
RBC: 4.1 MIL/uL (ref 3.87–5.11)
RDW: 13.2 % (ref 11.5–15.5)
WBC: 9.3 10*3/uL (ref 4.0–10.5)
nRBC: 0 % (ref 0.0–0.2)

## 2021-08-16 LAB — URINALYSIS, COMPLETE (UACMP) WITH MICROSCOPIC
Bilirubin Urine: NEGATIVE
Glucose, UA: NEGATIVE mg/dL
Ketones, ur: 40 mg/dL — AB
Leukocytes,Ua: NEGATIVE
Nitrite: NEGATIVE
Specific Gravity, Urine: >1.03 — ABNORMAL HIGH (ref 1.005–1.030)
pH: 5 (ref 5.0–8.0)

## 2021-08-16 NOTE — Patient Instructions (Signed)
I am concerned you could have a kidney stone. Please seek treatment in the emergency room.

## 2021-08-16 NOTE — ED Triage Notes (Signed)
Pt states has been vomiting since this am. Pt states she has had intermittent "vomiting for a while now". Pt states she is also having groin pain that began at 1600. Pt staes diarrhea. Pt appears in no acute distress. Denies known fever or hematuria.

## 2021-08-16 NOTE — Progress Notes (Signed)
Virtual Visit Consent   Danielle Dickerson, you are scheduled for a virtual visit with a [facility] provider today.     Just as with appointments in the office, your consent must be obtained to participate.  Your consent will be active for this visit and any virtual visit you may have with one of our providers in the next 365 days.     If you have a MyChart account, a copy of this consent can be sent to you electronically.  All virtual visits are billed to your insurance company just like a traditional visit in the office.    As this is a virtual visit, video technology does not allow for your provider to perform a traditional examination.  This may limit your provider's ability to fully assess your condition.  If your provider identifies any concerns that need to be evaluated in person or the need to arrange testing (such as labs, EKG, etc.), we will make arrangements to do so.     Although advances in technology are sophisticated, we cannot ensure that it will always work on either your end or our end.  If the connection with a video visit is poor, the visit may have to be switched to a telephone visit.  With either a video or telephone visit, we are not always able to ensure that we have a secure connection.     I need to obtain your verbal consent now.   Are you willing to proceed with your visit today?    [name] has provided verbal consent on 08/16/2021 for a virtual visit (video or telephone).   Carvel Getting, NP   Date: 08/16/2021 6:11 PM   Virtual Visit via Video Note   I, Carvel Getting, connected with  Danielle Dickerson  ([id number], October 11, 1956) on 08/16/21 at  6:00 PM EDT by a video-enabled telemedicine application and verified that I am speaking with the correct person using two identifiers.  Location: Patient: Virtual Visit Location Patient: Home Provider: Virtual Visit Location Provider: Home Office   I discussed the limitations of evaluation and management by telemedicine  and the availability of in person appointments. The patient expressed understanding and agreed to proceed.    History of Present Illness: Danielle Dickerson is a 65 y.o. who identifies as a female who was assigned female at birth, and is being seen today for vomiting and groin pain.  Patient reports she has a history of occasionally eating food and then vomiting and up bowel sometimes she can eat with no emesis or problems.  She has an appointment with the PCP in 2 days to have this problem evaluated.  This morning, she ate her usual breakfast and vomited.  This usually resolves her vomiting/nausea and she is able to go about her usual day without recurrent symptoms.  However, today she did not feel better after vomiting this morning.  Did not eat again until 3 PM when she ate a banana and some crackers and then vomited again.  She has been vomiting since then.  She also reports right sided flank and groin pain that has never happened before.  She denies fever, she denies dysuria, she denies hematuria.  She reports the pain is significant.  She reports she still feels nauseous and like she could vomit again.  HPI: HPI  Problems:  Patient Active Problem List   Diagnosis Date Noted   Pernicious anemia 07/03/2021   Screening for colon cancer    Atypical squamous cell changes  of undetermined significance (ASCUS) on cervical cytology with negative high risk human papilloma virus (HPV) test result 05/08/2020   HTN (hypertension) 05/06/2020   Hot flashes 05/06/2020   Routine physical examination 01/30/2020   DM (diabetes mellitus) ([place]) 01/30/2020   HLD (hyperlipidemia) 01/30/2020   GERD (gastroesophageal reflux disease) 01/30/2020   Irritable mood 01/30/2020    Allergies:  Allergies  Allergen Reactions   Pneumovax 23 [Pneumococcal Vac Polyvalent] Swelling    Causes arm swelling, redness.   Prevnar 13 [Pneumococcal 13-Val Conj Vacc] Swelling   Medications:  Current Outpatient Medications:     Accu-Chek FastClix Lancets MISC, Used to check blood sugars twice a day., Disp: 100 each, Rfl: 3   amLODipine (NORVASC) 2.5 MG tablet, Take 1 tablet (2.5 mg total) by mouth daily., Disp: 90 tablet, Rfl: 3   aspirin EC 81 MG tablet, Take 81 mg by mouth daily., Disp: , Rfl:    atorvastatin (LIPITOR) 40 MG tablet, TAKE 1 TABLET BY MOUTH EVERY DAY, Disp: 90 tablet, Rfl: 1   Blood Glucose Monitoring Suppl (ACCU-CHEK GUIDE ME) w/Device KIT, USE AS DIRECTED, Disp: 1 kit, Rfl: 0   Calcium-Vitamin D-Vitamin K (VIACTIV CALCIUM PLUS D PO), Take 1 tablet by mouth daily., Disp: , Rfl:    citalopram (CELEXA) 20 MG tablet, TAKE 1 TABLET BY MOUTH EVERY DAY, Disp: 90 tablet, Rfl: 2   glipiZIDE (GLUCOTROL XL) 5 MG 24 hr tablet, Take 1 tablet (5 mg total) by mouth daily with breakfast., Disp: 90 tablet, Rfl: 1   glucose blood (ACCU-CHEK AVIVA PLUS) test strip, Used to check blood sugars twice a day., Disp: 100 each, Rfl: 5   Insulin Pen Needle (PEN NEEDLES) 32G X 4 MM MISC, Used to give Victoza injections., Disp: 100 each, Rfl: 1   Lancets (ONETOUCH ULTRASOFT) lancets, Used to check blood sugars once a day., Disp: 100 each, Rfl: 4   lisinopril (ZESTRIL) 5 MG tablet, TAKE 1 TABLET BY MOUTH EVERY DAY, Disp: 90 tablet, Rfl: 1   metFORMIN (GLUCOPHAGE) 1000 MG tablet, TAKE 1 TABLET (1,000 MG TOTAL) BY MOUTH 2 (TWO) TIMES DAILY WITH A MEAL., Disp: 180 tablet, Rfl: 1   Multiple Vitamins-Minerals (CENTRUM SILVER ADULT 50+ PO), Take 1 tablet by mouth daily., Disp: , Rfl:    pantoprazole (PROTONIX) 20 MG tablet, Take 1 tablet (20 mg total) by mouth 2 (two) times daily before a meal., Disp: 120 tablet, Rfl: 1   Semaglutide, 1 MG/DOSE, (OZEMPIC, 1 MG/DOSE,) 2 MG/1.5ML SOPN, Inject 1 mg into the skin once a week., Disp: 3 mL, Rfl: 3  Observations/Objective: Patient is well-developed, well-nourished in no acute distress.  Patient appears to be in acute pain while she is at home.  Head is normocephalic, atraumatic.  No labored  breathing.  Speech is clear and coherent with logical content.  Patient is alert and oriented at baseline.  Patient points to right flank and posterior hip as the site of pain that radiates anteriorly and down into the groin.  Assessment and Plan: 1. Flank pain  2. Nausea and vomiting, intractability of vomiting not specified, unspecified vomiting type  I am suspicious that patient may have a kidney stone.  She has not had one in the past, but her symptoms are consistent with a kidney stone.  She appears to be in significant acute pain and she is not able to control her vomiting at home.  I recommended she seek care in an emergency room.  Follow Up Instructions: I discussed the assessment and  treatment plan with the patient. The patient was provided an opportunity to ask questions and all were answered. The patient agreed with the plan and demonstrated an understanding of the instructions.  A copy of instructions were sent to the patient via MyChart.  The patient was advised to call back or seek an in-person evaluation if the symptoms worsen or if the condition fails to improve as anticipated.  Time:  I spent 10 minutes with the patient via telehealth technology discussing the above problems/concerns.    Carvel Getting, NP

## 2021-08-17 ENCOUNTER — Emergency Department: Payer: Medicare HMO

## 2021-08-17 LAB — RESP PANEL BY RT-PCR (FLU A&B, COVID) ARPGX2
Influenza A by PCR: NEGATIVE
Influenza B by PCR: NEGATIVE
SARS Coronavirus 2 by RT PCR: NEGATIVE

## 2021-08-17 MED ORDER — OXYCODONE-ACETAMINOPHEN 5-325 MG PO TABS: 1.0000 | ORAL_TABLET | ORAL | 0 refills | Status: DC | PRN

## 2021-08-17 MED ORDER — LACTATED RINGERS IV BOLUS
1000.0000 mL | Freq: Once | INTRAVENOUS | Status: AC
  Administered 2021-08-17: 1000 mL via INTRAVENOUS

## 2021-08-17 MED ORDER — TAMSULOSIN HCL 0.4 MG PO CAPS
0.4000 mg | ORAL_CAPSULE | Freq: Once | ORAL | Status: AC
  Administered 2021-08-17: 0.4 mg via ORAL
  Filled 2021-08-17: qty 1

## 2021-08-17 MED ORDER — CEPHALEXIN 500 MG PO CAPS: 500.0000 mg | ORAL_CAPSULE | Freq: Three times a day (TID) | ORAL | 0 refills | Status: AC

## 2021-08-17 MED ORDER — SODIUM CHLORIDE 0.9 % IV SOLN
1.0000 g | Freq: Once | INTRAVENOUS | Status: AC
  Administered 2021-08-17: 1 g via INTRAVENOUS
  Filled 2021-08-17: qty 10

## 2021-08-17 MED ORDER — KETOROLAC TROMETHAMINE 30 MG/ML IJ SOLN
10.0000 mg | Freq: Once | INTRAMUSCULAR | Status: AC
  Administered 2021-08-17: 9.9 mg via INTRAVENOUS
  Filled 2021-08-17: qty 1

## 2021-08-17 MED ORDER — TAMSULOSIN HCL 0.4 MG PO CAPS: 0.4000 mg | ORAL_CAPSULE | Freq: Every day | ORAL | 0 refills | Status: AC

## 2021-08-17 MED ORDER — ONDANSETRON HCL 4 MG/2ML IJ SOLN
4.0000 mg | Freq: Once | INTRAMUSCULAR | Status: AC
  Administered 2021-08-17: 4 mg via INTRAVENOUS
  Filled 2021-08-17: qty 2

## 2021-08-17 MED ORDER — ONDANSETRON 4 MG PO TBDP: 4.0000 mg | ORAL_TABLET | Freq: Three times a day (TID) | ORAL | 0 refills | Status: DC | PRN

## 2021-08-17 MED ORDER — FENTANYL CITRATE PF 50 MCG/ML IJ SOSY
50.0000 ug | PREFILLED_SYRINGE | Freq: Once | INTRAMUSCULAR | Status: AC
  Administered 2021-08-17: 50 ug via INTRAVENOUS
  Filled 2021-08-17: qty 1

## 2021-08-17 NOTE — ED Provider Notes (Signed)
[facility] Emergency Department Provider Note  ____________________________________________  Time seen: Approximately 12:47 AM  I have reviewed the triage vital signs and the nursing notes.   HISTORY  Chief Complaint Vomiting   HPI Danielle Dickerson is a 65 y.o. female with a history of diabetes, eosinophilic esophagitis, esophageal stricture, hyperlipidemia, B12 deficiency anemia presents for evaluation of abdominal pain, nausea and vomiting.  Patient reports that her symptoms started this morning with nausea and vomiting and inability to tolerate p.o.  She reports several episodes of nonbloody nonbilious emesis.  Patient does report that she has been struggling with vomiting with certain foods but that is usually 1 episode and self resolves.  The one today has been the entire day.  Around 4 PM she started having right-sided flank pain radiating across to the right abdomen and down.  She describes the pain as a dull ache.  She has been having urinary frequency and dysuria since the pain started.  No fever or chills.  No diarrhea.  No chest pain or shortness of breath.  Past Medical History:  Diagnosis Date   COVID-19    05/27/21   Depression    Diabetes mellitus without complication ([place])    Eosinophilic esophagitis    Esophageal stricture 2019   Hyperlipidemia     Patient Active Problem List   Diagnosis Date Noted   Pernicious anemia 07/03/2021   Screening for colon cancer    Atypical squamous cell changes of undetermined significance (ASCUS) on cervical cytology with negative high risk human papilloma virus (HPV) test result 05/08/2020   HTN (hypertension) 05/06/2020   Hot flashes 05/06/2020   Routine physical examination 01/30/2020   DM (diabetes mellitus) ([place]) 01/30/2020   HLD (hyperlipidemia) 01/30/2020   GERD (gastroesophageal reflux disease) 01/30/2020   Irritable mood 01/30/2020    Past Surgical History:  Procedure Laterality Date    ABDOMINAL HYSTERECTOMY  2006   has ovaries. Had heavy periods and was anemic. HAS cervix   BREAST EXCISIONAL BIOPSY Right 2009   phyllodes removed    CHOLECYSTECTOMY OPEN     COLONOSCOPY WITH PROPOFOL N/A 02/02/2021   Procedure: COLONOSCOPY WITH PROPOFOL;  Surgeon: Lin Landsman, MD;  Location: [facility] ENDOSCOPY;  Service: Gastroenterology;  Laterality: N/A;    Prior to Admission medications   Medication Sig Start Date End Date Taking? Authorizing Provider  cephALEXin (KEFLEX) 500 MG capsule Take 1 capsule (500 mg total) by mouth 3 (three) times daily for 7 days. 08/17/21 08/24/21 Yes Khaya Theissen, [place], MD  ondansetron (ZOFRAN ODT) 4 MG disintegrating tablet Take 1 tablet (4 mg total) by mouth every 8 (eight) hours as needed. 08/17/21  Yes Jobanny Mavis, [place], MD  oxyCODONE-acetaminophen (PERCOCET) 5-325 MG tablet Take 1 tablet by mouth every 4 (four) hours as needed. 08/17/21  Yes Alfred Levins, [place], MD  tamsulosin (FLOMAX) 0.4 MG CAPS capsule Take 1 capsule (0.4 mg total) by mouth daily for 7 days. 08/17/21 08/24/21 Yes Sam Wunschel, [place], MD  Accu-Chek FastClix Lancets MISC Used to check blood sugars twice a day. 03/31/21   Burnard Hawthorne, FNP  amLODipine (NORVASC) 2.5 MG tablet Take 1 tablet (2.5 mg total) by mouth daily. 08/18/20   Leone Haven, MD  aspirin EC 81 MG tablet Take 81 mg by mouth daily.    [provider]  atorvastatin (LIPITOR) 40 MG tablet TAKE 1 TABLET BY MOUTH EVERY DAY 07/02/21   Burnard Hawthorne, FNP  Blood Glucose Monitoring Suppl (ACCU-CHEK GUIDE ME) w/Device KIT USE AS  DIRECTED 03/31/21   Burnard Hawthorne, FNP  Calcium-Vitamin D-Vitamin K (VIACTIV CALCIUM PLUS D PO) Take 1 tablet by mouth daily.    [provider]  citalopram (CELEXA) 20 MG tablet TAKE 1 TABLET BY MOUTH EVERY DAY 01/27/21   Burnard Hawthorne, FNP  glipiZIDE (GLUCOTROL XL) 5 MG 24 hr tablet Take 1 tablet (5 mg total) by mouth daily with breakfast. 05/15/21   Burnard Hawthorne, FNP   glucose blood (ACCU-CHEK AVIVA PLUS) test strip Used to check blood sugars twice a day. 03/31/21   Burnard Hawthorne, FNP  Insulin Pen Needle (PEN NEEDLES) 32G X 4 MM MISC Used to give Victoza injections. 08/14/20   Burnard Hawthorne, FNP  Lancets [facility] ULTRASOFT) lancets Used to check blood sugars once a day. 01/30/20   Burnard Hawthorne, FNP  lisinopril (ZESTRIL) 5 MG tablet TAKE 1 TABLET BY MOUTH EVERY DAY 07/14/21   Burnard Hawthorne, FNP  metFORMIN (GLUCOPHAGE) 1000 MG tablet TAKE 1 TABLET (1,000 MG TOTAL) BY MOUTH 2 (TWO) TIMES DAILY WITH A MEAL. 07/01/21   Burnard Hawthorne, FNP  Multiple Vitamins-Minerals (CENTRUM SILVER ADULT 50+ PO) Take 1 tablet by mouth daily.    [provider]  pantoprazole (PROTONIX) 20 MG tablet Take 1 tablet (20 mg total) by mouth 2 (two) times daily before a meal. 05/18/21   Arnett, Yvetta Coder, FNP  Semaglutide, 1 MG/DOSE, (OZEMPIC, 1 MG/DOSE,) 2 MG/1.5ML SOPN Inject 1 mg into the skin once a week. 04/14/21   Burnard Hawthorne, FNP    Allergies Pneumovax 23 [pneumococcal vac polyvalent] and Prevnar 13 [pneumococcal 13-val conj vacc]  Family History  Problem Relation Age of Onset   Congestive Heart Failure Mother    Colon cancer Father    Esophageal cancer Maternal Uncle    Thyroid cancer Neg Hx     Social History Social History   Tobacco Use   Smoking status: Never   Smokeless tobacco: Never  Vaping Use   Vaping Use: Never used  Substance Use Topics   Alcohol use: Not Currently   Drug use: Never    Review of Systems  Constitutional: Negative for fever. Eyes: Negative for visual changes. ENT: Negative for sore throat. Neck: No neck pain  Cardiovascular: Negative for chest pain. Respiratory: Negative for shortness of breath. Gastrointestinal: + abdominal pain, nausea, and vomiting. No diarrhea. Genitourinary: + dysuria, frequency, R flank pain Musculoskeletal: Negative for back pain. Skin: Negative for rash. Neurological:  Negative for headaches, weakness or numbness. Psych: No SI or HI  ____________________________________________   PHYSICAL EXAM:  VITAL SIGNS: ED Triage Vitals  Enc Vitals Group     BP 08/16/21 1904 (!) 158/104     Pulse Rate 08/16/21 1904 81     Resp 08/16/21 1904 19     Temp 08/16/21 1904 98.2 F (36.8 C)     Temp Source 08/16/21 1904 Oral     SpO2 08/16/21 1904 96 %     Weight 08/16/21 1907 217 lb (98.4 kg)     Height 08/16/21 1907 6' (1.829 m)     Head Circumference --      Peak Flow --      Pain Score 08/16/21 1907 6     Pain Loc --      Pain Edu? --      Excl. in [place]? --     Constitutional: Alert and oriented. Well appearing and in no apparent distress. HEENT:      Head:  Normocephalic and atraumatic.         Eyes: Conjunctivae are normal. Sclera is non-icteric.       Mouth/Throat: Mucous membranes are moist.       Neck: Supple with no signs of meningismus. Cardiovascular: Regular rate and rhythm. No murmurs, gallops, or rubs. 2+ symmetrical distal pulses are present in all extremities. No JVD. Respiratory: Normal respiratory effort. Lungs are clear to auscultation bilaterally.  Gastrointestinal: Soft, patient is diffusely tender to palpation on the right quadrants, and non distended with positive bowel sounds. No rebound or guarding. Genitourinary: R CVA tenderness. Musculoskeletal:  No edema, cyanosis, or erythema of extremities. Neurologic: Normal speech and language. Face is symmetric. Moving all extremities. No gross focal neurologic deficits are appreciated. Skin: Skin is warm, dry and intact. No rash noted. Psychiatric: Mood and affect are normal. Speech and behavior are normal.  ____________________________________________   LABS (all labs ordered are listed, but only abnormal results are displayed)  Labs Reviewed  COMPREHENSIVE METABOLIC PANEL - Abnormal; Notable for the following components:      Result Value   Glucose, Bld 193 (*)    All other  components within normal limits  CBC - Abnormal; Notable for the following components:   HCT 34.4 (*)    All other components within normal limits  URINALYSIS, COMPLETE (UACMP) WITH MICROSCOPIC - Abnormal; Notable for the following components:   APPearance TURBID (*)    Specific Gravity, Urine >1.030 (*)    Hgb urine dipstick LARGE (*)    Ketones, ur 40 (*)    Protein, ur TRACE (*)    Bacteria, UA MANY (*)    All other components within normal limits  RESP PANEL BY RT-PCR (FLU A&B, COVID) [id number]   ____________________________________________  EKG  none  ____________________________________________  RADIOLOGY  I have personally reviewed the images performed during this visit and I agree with the Radiologist's read.   Interpretation by Radiologist:  CT Renal Stone Study  Result Date: 08/17/2021 CLINICAL DATA:  Flank pain EXAM: CT ABDOMEN AND PELVIS WITHOUT CONTRAST TECHNIQUE: Multidetector CT imaging of the abdomen and pelvis was performed following the standard protocol without IV contrast. COMPARISON:  None. FINDINGS: LOWER CHEST: Normal. HEPATOBILIARY: Normal hepatic contours. No intra- or extrahepatic biliary dilatation. Status post cholecystectomy. PANCREAS: Normal pancreas. No ductal dilatation or peripancreatic fluid collection. SPLEEN: Normal. ADRENALS/URINARY TRACT: The adrenal glands are normal. There is a stone within the distal right ureter measuring 3 mm, causing moderate hydroureteronephrosis and mild perinephric stranding. The urinary bladder is normal for degree of distention. No left nephroureterolithiasis. STOMACH/BOWEL: There is no hiatal hernia. Normal duodenal course and caliber. No small bowel dilatation or inflammation. No focal colonic abnormality. Normal appendix. VASCULAR/LYMPHATIC: Normal course and caliber of the major abdominal vessels. No abdominal or pelvic lymphadenopathy. REPRODUCTIVE: Status post hysterectomy. No adnexal mass. MUSCULOSKELETAL. No bony  spinal canal stenosis or focal osseous abnormality. OTHER: None. IMPRESSION: Right-sided obstructive uropathy with 3 mm stone in the distal right ureter causing moderate hydroureteronephrosis and mild perinephric stranding. Electronically Signed   By: Ulyses Jarred M.D.   On: 08/17/2021 01:06     ____________________________________________   PROCEDURES  Procedure(s) performed:yes .1-3 Lead EKG Interpretation Performed by: Rudene Re, MD Authorized by: Rudene Re, MD     Interpretation: non-specific     ECG rate assessment: normal     Rhythm: sinus rhythm     Ectopy: none     Conduction: normal    Critical Care performed:  None ____________________________________________  INITIAL IMPRESSION / ASSESSMENT AND PLAN / ED COURSE  65 y.o. female with a history of diabetes, eosinophilic esophagitis, esophageal stricture, hyperlipidemia, B12 deficiency anemia presents for evaluation of abdominal pain, nausea and vomiting, flank pain, dysuria and frequency.  Patient is well-appearing in no distress with normal vital signs.  Abdomen is soft with tenderness to palpation of the right quadrants, and right flank.  No rebound or guarding.  Differential diagnoses including pyelonephritis versus kidney stones versus UTI versus diverticulitis versus gastritis versus peptic ulcer disease versus pancreatitis versus cholelithiasis versus appendicitis.  Labs showing no leukocytosis, UA positive for urinary tract infection.  Blood glucose of 193 with no signs of DKA.  CT renal is pending.  Will give IV fluids, IV Zofran, IV fentanyl, IV Rocephin and reassess.  Old medical records reviewed.   _________________________ 4:21 AM on 08/17/2021 ----------------------------------------- CT consistent with 3 mm stone in the distal right ureter.  UA showing many bacteria but no nitrites or leuks.  Discussed with Dr. Junious Silk from urology who recommended a dose of IV Rocephin and discharged home  on p.o. Keflex for outpatient follow-up.  Patient was monitored for several hours post IV medication in the emergency room and has no more pain.  Possibly passed the stone already.  She is tolerating p.o. no further episodes of nausea and vomiting.  There is no signs of sepsis, no leukocytosis, no fever, no tachycardia.  Urine has been sent for culture.  Will discharge home on Keflex.  Recommended close follow-up with urology if pain recurs and return to the emergency room if she has fever.    _____________________________________________ Please note:  Patient was evaluated in Emergency Department today for the symptoms described in the history of present illness. Patient was evaluated in the context of the global COVID-19 pandemic, which necessitated consideration that the patient might be at risk for infection with the SARS-CoV-2 virus that causes COVID-19. Institutional protocols and algorithms that pertain to the evaluation of patients at risk for COVID-19 are in a state of rapid change based on information released by regulatory bodies including the CDC and federal and state organizations. These policies and algorithms were followed during the patient's care in the ED.  Some ED evaluations and interventions may be delayed as a result of limited staffing during the pandemic.   [place] Controlled Substance Database was reviewed by me. ____________________________________________   FINAL CLINICAL IMPRESSION(S) / ED DIAGNOSES   Final diagnoses:  Kidney stone  Renal colic      NEW MEDICATIONS STARTED DURING THIS VISIT:  ED Discharge Orders          Ordered    ondansetron (ZOFRAN ODT) 4 MG disintegrating tablet  Every 8 hours PRN        08/17/21 0418    cephALEXin (KEFLEX) 500 MG capsule  3 times daily        08/17/21 0418    tamsulosin (FLOMAX) 0.4 MG CAPS capsule  Daily        08/17/21 0418    oxyCODONE-acetaminophen (PERCOCET) 5-325 MG tablet  Every 4 hours PRN        08/17/21 0418              Note:  This document was prepared using Dragon voice recognition software and may include unintentional dictation errors.    Alfred Levins, [place], MD 08/17/21 [phone]

## 2021-08-17 NOTE — ED Notes (Signed)
MD at the bedside  

## 2021-08-17 NOTE — Discharge Instructions (Signed)
You have been seen in the Emergency Department (ED)  Today and was diagnosed with kidney stones. While the stone is traveling through the ureter, which is the tube that carries urine from the kidney to the bladder, you will probably feel pain. The pain may be mild or very severe. You may also have some blood in your urine. As soon as the stone reaches the bladder, any intense pain should go away. If a stone is too large to pass on its own, you may need a medical procedure to help you pass the stone.   As we have discussed, please drink plenty of fluids and use a urinary strainer to attempt to capture the stone.  Please make a follow up appointment with Urology in the next week by calling the number below and bring the stone with you.  Pain control: Take ibuprofen 600mg  every 6 hours for the pain. If the pain is not well controlled with ibuprofen you may take one percocet every 4 hours. Do not take tylenol while taking percocet. Please also take your prescribed flomax daily.  If you do not have any more pain you do not need to take the Percocet, Zofran, or Flomax.  However make sure to take the Keflex fully as prescribed.  Follow-up with your doctor or return to the ER in 12-24 hours if your pain is not well controlled, if you develop pain or burning with urination, or if you develop a fever. Otherwise follow up in 3-5 days with your doctor.  When should you call for help?  Call your doctor now or seek immediate medical care if:  You cannot keep down fluids.  Your pain gets worse.  You have a fever or chills.  You have new or worse pain in your back just below your rib cage (the flank area).  You have new or more blood in your urine. You have pain or burning with urination You are unable to urinate You have abdominal pain  Watch closely for changes in your health, and be sure to contact your doctor if:  You do not get better as expected  How can you care for yourself at home?  Drink plenty of  fluids, enough so that your urine is light yellow or clear like water. If you have kidney, heart, or liver disease and have to limit fluids, talk with your doctor before you increase the amount of fluids you drink.  Take pain medicines exactly as directed. Call your doctor if you think you are having a problem with your medicine.  If the doctor gave you a prescription medicine for pain, take it as prescribed.  If you are not taking a prescription pain medicine, ask your doctor if you can take an over-the-counter medicine. Read and follow all instructions on the label. Your doctor may ask you to strain your urine so that you can collect your kidney stone when it passes. You can use a kitchen strainer or a tea strainer to catch the stone. Store it in a plastic bag until you see your doctor again.  Preventing future kidney stones  Some changes in your diet may help prevent kidney stones. Depending on the cause of your stones, your doctor may recommend that you:  Drink plenty of fluids, enough so that your urine is light yellow or clear like water. If you have kidney, heart, or liver disease and have to limit fluids, talk with your doctor before you increase the amount of fluids you drink.  Limit coffee, tea, and alcohol. Also avoid grapefruit juice.  Do not take more than the recommended daily dose of vitamins C and D.  Avoid antacids such as Gaviscon, Maalox, Mylanta, or Tums.  Limit the amount of salt (sodium) in your diet.  Eat a balanced diet that is not too high in protein.  Limit foods that are high in a substance called oxalate, which can cause kidney stones. These foods include dark green vegetables, rhubarb, chocolate, wheat bran, nuts, cranberries, and beans.

## 2021-08-18 ENCOUNTER — Other Ambulatory Visit: Payer: Self-pay

## 2021-08-18 ENCOUNTER — Encounter: Payer: Self-pay | Admitting: Family

## 2021-08-18 ENCOUNTER — Ambulatory Visit (INDEPENDENT_AMBULATORY_CARE_PROVIDER_SITE_OTHER): Payer: Medicare HMO | Admitting: Family

## 2021-08-18 VITALS — BP 110/60 | HR 86 | Temp 98.6°F | Ht 72.0 in | Wt 208.2 lb

## 2021-08-18 DIAGNOSIS — I10 Essential (primary) hypertension: Secondary | ICD-10-CM

## 2021-08-18 DIAGNOSIS — R1114 Bilious vomiting: Secondary | ICD-10-CM

## 2021-08-18 DIAGNOSIS — E119 Type 2 diabetes mellitus without complications: Secondary | ICD-10-CM

## 2021-08-18 DIAGNOSIS — N200 Calculus of kidney: Secondary | ICD-10-CM | POA: Insufficient documentation

## 2021-08-18 MED ORDER — OZEMPIC (0.25 OR 0.5 MG/DOSE) 2 MG/1.5ML ~~LOC~~ SOPN: 0.5000 mg | PEN_INJECTOR | SUBCUTANEOUS | 3 refills | Status: DC

## 2021-08-18 NOTE — Patient Instructions (Addendum)
Trial stop of amlodipine 2.5mg   It is imperative that you are seen AT least twice per year for labs and monitoring. Monitor blood pressure at home and me 5-6 reading on separate days. Goal is less than 120/80, based on newest guidelines, however we certainly want to be less than 130/80;  if persistently higher, please make sooner follow up appointment so we can recheck you blood pressure and manage/ adjust medications.  Concerned for gastroparesis. Please continue small frequent meals.   Start pepcid ac over the counter 20mg  at bedtime.   Continue protonix 20mg , one hour before breakfast.  Decrease ozempic 0.5mg    We recommend you get the updated bivalent COVID-19 booster, at least 2 months after any prior doses. You can find pharmacies that have this formulation in stock at  Please let me know how you are doing   Gastroparesis Gastroparesis is a condition in which food takes longer than normal to empty from the stomach. This condition is also known as delayed gastric emptying. It is usually a long-term (chronic) condition. There is no cure, but there are treatments and things that you can do at home to help relieve symptoms. Treating the underlying condition that causes gastroparesis can also help relieve symptoms. What are the causes? In many cases, the cause of this condition is not known. Possible causes include: A hormone (endocrine) disorder, such as hypothyroidism or diabetes. A nervous system disease, such as Parkinson's disease or multiple sclerosis. Cancer, infection, or surgery that affects the stomach or vagus nerve. The vagus nerve runs from your chest, through your neck, and to the lower part of your brain. A connective tissue disorder, such as scleroderma. Certain medicines. What increases the risk? You are more likely to develop this condition if: You have certain disorders or diseases. These may include: An endocrine disorder. An eating  disorder. Amyloidosis. Scleroderma. Parkinson's disease. Multiple sclerosis. Cancer or infection of the stomach or the vagus nerve. You have had surgery on your stomach or vagus nerve. You take certain medicines. You are female. What are the signs or symptoms? Symptoms of this condition include: Feeling full after eating very little or a loss of appetite. Nausea, vomiting, or heartburn. Bloating of your abdomen. Inconsistent blood sugar (glucose) levels on blood tests. Unexplained weight loss. Acid from the stomach coming up into the esophagus (gastroesophageal reflux). Sudden tightening (spasm) of the stomach, which can be painful. Symptoms may come and go. Some people may not notice any symptoms. How is this diagnosed? This condition is diagnosed with tests, such as: Tests that check how long it takes food to move through the stomach and intestines. These tests include: Upper gastrointestinal (GI) series. For this test, you drink a liquid that shows up well on X-rays, and then X-rays are taken of your intestines. Gastric emptying scintigraphy. For this test, you eat food that contains a small amount of radioactive material, and then scans are taken. Wireless capsule GI monitoring system. For this test, you swallow a pill (capsule) that records information about how foods and fluid move through your stomach. Gastric manometry. For this test, a tube is passed down your throat and into your stomach to measure electrical and muscular activity. Endoscopy. For this test, a long, thin tube with a camera and light on the end is passed down your throat and into your stomach to check for problems in your stomach lining. Ultrasound. This test uses sound waves to create images of the inside of your body. This can help  rule out gallbladder disease or pancreatitis as a cause of your symptoms. How is this treated? There is no cure for this condition, but treatment and home care may relieve symptoms.  Treatment may include: Treating the underlying cause. Managing your symptoms by making changes to your diet and exercise habits. Taking medicines to control nausea and vomiting and to stimulate stomach muscles. Getting food through a feeding tube in the hospital. This may be done in severe cases. Having surgery to insert a device called a gastric electrical stimulator into your body. This device helps improve stomach emptying and control nausea and vomiting. Follow these instructions at home: Take over-the-counter and prescription medicines only as told by your health care provider. Follow instructions from your health care provider about eating or drinking restrictions. Your health care provider may recommend that you: Eat smaller meals more often. Eat low-fat foods. Eat low-fiber forms of high-fiber foods. For example, eat cooked vegetables instead of raw vegetables. Have only liquid foods instead of solid foods. Liquid foods are easier to digest. Drink enough fluid to keep your urine pale yellow. Exercise as often as told by your health care provider. Keep all follow-up visits. This is important. Contact a health care provider if you: Notice that your symptoms do not improve with treatment. Have new symptoms. Get help right away if you: Have severe pain in your abdomen that does not improve with treatment. Have nausea that is severe or does not go away. Vomit every time you drink fluids. Summary Gastroparesis is a long-term (chronic) condition in which food takes longer than normal to empty from the stomach. Symptoms include nausea, vomiting, heartburn, bloating of your abdomen, and loss of appetite. Eating smaller portions, low-fat foods, and low-fiber forms of high-fiber foods may help you manage your symptoms. Get help right away if you have severe pain in your abdomen. This information is not intended to replace advice given to you by your health care provider. Make sure you  discuss any questions you have with your health care provider. Document Revised: 03/24/2020 Document Reviewed: 03/24/2020 Elsevier Patient Education  2022 [employer].

## 2021-08-18 NOTE — Progress Notes (Signed)
Subjective:    Patient ID: Danielle Dickerson, female    DOB: 1956-04-26, 65 y.o.   MRN: [id number]  CC: Danielle Dickerson is a 65 y.o. female who presents today for follow up.   HPI: Feels better today Mild right low back pain, improved from prior.She is unsure if she has passed renal stone. No fever, dysuria, hematuria. Vomited has resolved. Endorses nausea.  Compliant with keflex.     Seen in ED 08/16/21 for abdominal pain, right sided flank pain,  vomiting.  Diagnosed with right renal stone.  CT abdomen and pelvis consistent with 3 mm stone in the distal right ureter.  No hiatal hernia, small bowel dilatation or inflammation.  Normal appendix.  No adnexal mass.   No electrolyte disturbance, leukocytosis on labs  UA showing bacteria, no nitrites or leukocytes.  No urine culture ordered. Given IV Rocephin and discharged home on p.o. Keflex for outpatient follow-up.  Appointment with Dr Danielle Dickerson tomorrow   DM- diagnosed with DM in her 22's. Compliant with  metformin 1000 mg twice daily, Ozempic 1 mg, glipizide 5 mg  Emesis - started over the past 3 days ago and associated with right flank pain. Most of her life she has had episodic emesis and will eat and 'randomly' throw up; this started prior to ozempic. Generally she been able to tolerate oatmeal however threw up, non bloody 3 days ago. After she vomits, she feels 'perfectly fine'. Endorses early satiety. No abdominal distention, epigastric burning.    Compliant with Protonix 20 mg once per day; BID didn't help.   H/o esophogeal stricture s/p stretching  Hypertension-compliant with amlodipine 2.5 mg, lisinopril 5 mg     Upcoming appointment with GI, Dr. Marius Dickerson for pernicious anemia 09/02/21  H/o EoE  HISTORY:  Past Medical History:  Diagnosis Date   COVID-19    05/27/21   Depression    Diabetes mellitus without complication ([place])    Eosinophilic esophagitis    Esophageal stricture 2019   Hyperlipidemia    Past Surgical  History:  Procedure Laterality Date   ABDOMINAL HYSTERECTOMY  2006   has ovaries. Had heavy periods and was anemic. HAS cervix   BREAST EXCISIONAL BIOPSY Right 2009   phyllodes removed    CHOLECYSTECTOMY OPEN     COLONOSCOPY WITH PROPOFOL N/A 02/02/2021   Procedure: COLONOSCOPY WITH PROPOFOL;  Surgeon: Danielle Landsman, MD;  Location: [facility] ENDOSCOPY;  Service: Gastroenterology;  Laterality: N/A;   Family History  Problem Relation Age of Onset   Congestive Heart Failure Mother    Colon cancer Father    Esophageal cancer Maternal Uncle    Thyroid cancer Neg Hx     Allergies: Pneumovax 23 [pneumococcal vac polyvalent] and Prevnar 13 [pneumococcal 13-val conj vacc] Current Outpatient Medications on File Prior to Visit  Medication Sig Dispense Refill   Accu-Chek FastClix Lancets MISC Used to check blood sugars twice a day. 100 each 3   aspirin EC 81 MG tablet Take 81 mg by mouth daily.     atorvastatin (LIPITOR) 40 MG tablet TAKE 1 TABLET BY MOUTH EVERY DAY 90 tablet 1   Blood Glucose Monitoring Suppl (ACCU-CHEK GUIDE ME) w/Device KIT USE AS DIRECTED 1 kit 0   Calcium-Vitamin D-Vitamin K (VIACTIV CALCIUM PLUS D PO) Take 1 tablet by mouth daily.     cephALEXin (KEFLEX) 500 MG capsule Take 1 capsule (500 mg total) by mouth 3 (three) times daily for 7 days. 21 capsule 0   citalopram (CELEXA) 20 MG tablet  TAKE 1 TABLET BY MOUTH EVERY DAY 90 tablet 2   glipiZIDE (GLUCOTROL XL) 5 MG 24 hr tablet Take 1 tablet (5 mg total) by mouth daily with breakfast. 90 tablet 1   glucose blood (ACCU-CHEK AVIVA PLUS) test strip Used to check blood sugars twice a day. 100 each 5   Insulin Pen Needle (PEN NEEDLES) 32G X 4 MM MISC Used to give Victoza injections. 100 each 1   Lancets (ONETOUCH ULTRASOFT) lancets Used to check blood sugars once a day. 100 each 4   lisinopril (ZESTRIL) 5 MG tablet TAKE 1 TABLET BY MOUTH EVERY DAY 90 tablet 1   metFORMIN (GLUCOPHAGE) 1000 MG tablet TAKE 1 TABLET (1,000 MG TOTAL)  BY MOUTH 2 (TWO) TIMES DAILY WITH A MEAL. 180 tablet 1   Multiple Vitamins-Minerals (CENTRUM SILVER ADULT 50+ PO) Take 1 tablet by mouth daily.     ondansetron (ZOFRAN ODT) 4 MG disintegrating tablet Take 1 tablet (4 mg total) by mouth every 8 (eight) hours as needed. 20 tablet 0   pantoprazole (PROTONIX) 20 MG tablet Take 1 tablet (20 mg total) by mouth 2 (two) times daily before a meal. 120 tablet 1   tamsulosin (FLOMAX) 0.4 MG CAPS capsule Take 1 capsule (0.4 mg total) by mouth daily for 7 days. 7 capsule 0   oxyCODONE-acetaminophen (PERCOCET) 5-325 MG tablet Take 1 tablet by mouth every 4 (four) hours as needed. (Patient not taking: Reported on 08/18/2021) 12 tablet 0   No current facility-administered medications on file prior to visit.    Social History   Tobacco Use   Smoking status: Never   Smokeless tobacco: Never  Vaping Use   Vaping Use: Never used  Substance Use Topics   Alcohol use: Not Currently   Drug use: Never    Review of Systems  Constitutional:  Negative for chills and fever.  Respiratory:  Negative for cough.   Cardiovascular:  Negative for chest pain and palpitations.  Gastrointestinal:  Positive for nausea and vomiting. Negative for abdominal distention, abdominal pain, constipation and diarrhea.  Genitourinary:  Negative for difficulty urinating.     Objective:    BP 110/60   Pulse 86   Temp 98.6 F (37 C) (Oral)   Ht 6' (1.829 m)   Wt 208 lb 3.2 oz (94.4 kg)   SpO2 98%   BMI 28.24 kg/m  BP Readings from Last 3 Encounters:  08/18/21 110/60  08/17/21 138/70  06/29/21 114/72   Wt Readings from Last 3 Encounters:  08/18/21 208 lb 3.2 oz (94.4 kg)  08/16/21 217 lb (98.4 kg)  06/29/21 211 lb 6.4 oz (95.9 kg)    Physical Exam Vitals reviewed.  Constitutional:      Appearance: Normal appearance. She is well-developed.  Eyes:     Conjunctiva/sclera: Conjunctivae normal.  Cardiovascular:     Rate and Rhythm: Normal rate and regular rhythm.      Pulses: Normal pulses.     Heart sounds: Normal heart sounds.  Pulmonary:     Effort: Pulmonary effort is normal.     Breath sounds: Normal breath sounds. No wheezing, rhonchi or rales.  Abdominal:     General: Bowel sounds are normal. There is no distension.     Palpations: Abdomen is soft. Abdomen is not rigid. There is no fluid wave or mass.     Tenderness: There is no abdominal tenderness. There is no guarding or rebound.  Skin:    General: Skin is warm and dry.  Neurological:  Mental Status: She is alert.  Psychiatric:        Speech: Speech normal.        Behavior: Behavior normal.        Thought Content: Thought content normal.       Assessment & Plan:   Problem List Items Addressed This Visit       Cardiovascular and Mediastinum   HTN (hypertension)    Blood pressure on low end today.  Suspect this is related to weight loss.  Advised patient to trial stop amlodipine 2.5 mg.  She will remain on lisinopril 5 mg for renal protection in setting of diabetes        Digestive   Bilious emesis    Symptoms consistent with gastroparesis particular in the setting of longstanding diabetes.  We also discussed what is related to esophageal stricture, GERD.  Advised her to stop Ozempic altogether as it delays gastric emptying.  She politely declines and states she has had emesis throughout her life.  She was willing to reduce Ozempic to 0.5 mg, new prescription was sent.  She also has a history of esophageal stricture, eosinophilic esophagitis, positive intrinsic factor.  Advised patient to discuss all of this has upcoming GI appointment with Dr. Marius Dickerson. Advised she will like need to  exclude mechanical obstruction with EGD, then proceed with evaluation for gastric motility.  I reemphasized the importance of small frequent meals.  I also advised her to start Pepcid AC nightly, and to take the Protonix 20 mg 1 hour before breakfast for most effectiveness.        Endocrine   DM  (diabetes mellitus) ([place]) - Primary   Relevant Medications   Semaglutide,0.25 or 0.5MG/DOS, (OZEMPIC, 0.25 OR 0.5 MG/DOSE,) 2 MG/1.5ML SOPN     Genitourinary   Renal stone    Follow-up with urology, Dr. Erlene Dickerson tomorrow.  Will follow.        I have discontinued Alliana Rupnow's amLODipine and Ozempic (1 MG/DOSE). I am also having her start on Ozempic (0.25 or 0.5 MG/DOSE). Additionally, I am having her maintain her Multiple Vitamins-Minerals (CENTRUM SILVER ADULT 50+ PO), aspirin EC, onetouch ultrasoft, Pen Needles, citalopram, Calcium-Vitamin D-Vitamin K (VIACTIV CALCIUM PLUS D PO), Accu-Chek Aviva Plus, Accu-Chek FastClix Lancets, Accu-Chek Guide Me, glipiZIDE, pantoprazole, metFORMIN, atorvastatin, lisinopril, ondansetron, cephALEXin, tamsulosin, and oxyCODONE-acetaminophen.   Meds ordered this encounter  Medications   Semaglutide,0.25 or 0.5MG/DOS, (OZEMPIC, 0.25 OR 0.5 MG/DOSE,) 2 MG/1.5ML SOPN    Sig: Inject 0.5 mg into the skin once a week.    Dispense:  3 mL    Refill:  3    Order Specific Question:   Supervising Provider    Answer:   Crecencio Mc [[id number]]    Return precautions given.   Risks, benefits, and alternatives of the medications and treatment plan prescribed today were discussed, and patient expressed understanding.   Education regarding symptom management and diagnosis given to patient on AVS.  Continue to follow with Burnard Hawthorne, FNP for routine health maintenance.   [name] and I agreed with plan.   Mable Paris, FNP

## 2021-08-18 NOTE — Assessment & Plan Note (Addendum)
Symptoms consistent with gastroparesis particular in the setting of longstanding diabetes.  We also discussed what is related to esophageal stricture, GERD.  Advised her to stop Ozempic altogether as it delays gastric emptying.  She politely declines and states she has had emesis throughout her life.  She was willing to reduce Ozempic to 0.5 mg, new prescription was sent.  She also has a history of esophageal stricture, eosinophilic esophagitis, positive intrinsic factor.  Advised patient to discuss all of this has upcoming GI appointment with Dr. Allegra Lai. Advised she will like need to  exclude mechanical obstruction with EGD, then proceed with evaluation for gastric motility.  I reemphasized the importance of small frequent meals.  I also advised her to start Pepcid AC nightly, and to take the Protonix 20 mg 1 hour before breakfast for most effectiveness.

## 2021-08-18 NOTE — Assessment & Plan Note (Signed)
Follow-up with urology, Dr. Apolinar Junes tomorrow.  Will follow.

## 2021-08-18 NOTE — Progress Notes (Signed)
08/19/21 11:16 AM   Danielle Dickerson 29-May-1956 [id number]  Referring provider:  Burnard Hawthorne, FNP [id number] [place],  [place] [address] Chief Complaint  Patient presents with   Nephrolithiasis     HPI: Danielle Dickerson is a 65 y.o.female who presents today for further evaluation of stones.   She was seen and evaluated in the ED on 08/16/2021 for abdominal pain. CT renal stone study showed right-sided obstructive uropathy with 3 mm stone in the distal right ureter causing moderate hydroureteronephrosis and mild perinephric stranding. Urinalysis showed large blood, 40 ketones, many bacteria, and amorphous crystals were present.   She reports that vomiting started on Sunday, she has been experiencing nausea for a while now. This was onset to the pain that followed after.   The pain was intense, and her right lower quadrant and radiated down to her vaginal/clitoral area.  After finally being medicated in the emergency room, her pain quickly subsided.  She has not been experiencing burning, blood, or any pain in the vaginal area.   No previous stone episodes.  No additional upper tract stones on CT scan.  Because of her nausea vomiting, she reports that she has had difficulty keeping fluids down.   PMH: Past Medical History:  Diagnosis Date   COVID-19    05/27/21   Depression    Diabetes mellitus without complication ([place])    Eosinophilic esophagitis    Esophageal stricture 2019   Hyperlipidemia     Surgical History: Past Surgical History:  Procedure Laterality Date   ABDOMINAL HYSTERECTOMY  2006   has ovaries. Had heavy periods and was anemic. HAS cervix   BREAST EXCISIONAL BIOPSY Right 2009   phyllodes removed    CHOLECYSTECTOMY OPEN     COLONOSCOPY WITH PROPOFOL N/A 02/02/2021   Procedure: COLONOSCOPY WITH PROPOFOL;  Surgeon: Lin Landsman, MD;  Location: [facility] ENDOSCOPY;  Service: Gastroenterology;  Laterality: N/A;    Home Medications:   Allergies as of 08/19/2021       Reactions   Pneumovax 23 [pneumococcal Vac Polyvalent] Swelling   Causes arm swelling, redness.   Prevnar 13 [pneumococcal 13-val Conj Vacc] Swelling        Medication List        Accurate as of August 19, 2021 11:16 AM. If you have any questions, ask your nurse or doctor.          Accu-Chek Aviva Plus test strip Generic drug: glucose blood Used to check blood sugars twice a day.   Accu-Chek Guide Me w/Device Kit USE AS DIRECTED   aspirin EC 81 MG tablet Take 81 mg by mouth daily.   atorvastatin 40 MG tablet Commonly known as: LIPITOR TAKE 1 TABLET BY MOUTH EVERY DAY   CENTRUM SILVER ADULT 50+ PO Take 1 tablet by mouth daily.   cephALEXin 500 MG capsule Commonly known as: KEFLEX Take 1 capsule (500 mg total) by mouth 3 (three) times daily for 7 days.   citalopram 20 MG tablet Commonly known as: CELEXA TAKE 1 TABLET BY MOUTH EVERY DAY   glipiZIDE 5 MG 24 hr tablet Commonly known as: GLUCOTROL XL Take 1 tablet (5 mg total) by mouth daily with breakfast.   lisinopril 5 MG tablet Commonly known as: ZESTRIL TAKE 1 TABLET BY MOUTH EVERY DAY   metFORMIN 1000 MG tablet Commonly known as: GLUCOPHAGE TAKE 1 TABLET (1,000 MG TOTAL) BY MOUTH 2 (TWO) TIMES DAILY WITH A MEAL.   ondansetron 4 MG disintegrating tablet Commonly  known as: Zofran ODT Take 1 tablet (4 mg total) by mouth every 8 (eight) hours as needed.   onetouch ultrasoft lancets Used to check blood sugars once a day.   Accu-Chek FastClix Lancets Misc Used to check blood sugars twice a day.   oxyCODONE-acetaminophen 5-325 MG tablet Commonly known as: Percocet Take 1 tablet by mouth every 4 (four) hours as needed.   Ozempic (0.25 or 0.5 MG/DOSE) 2 MG/1.5ML Sopn Generic drug: Semaglutide(0.25 or 0.5MG/DOS) Inject 0.5 mg into the skin once a week.   pantoprazole 20 MG tablet Commonly known as: PROTONIX Take 1 tablet (20 mg total) by mouth 2 (two) times daily  before a meal.   Pen Needles 32G X 4 MM Misc Used to give Victoza injections.   tamsulosin 0.4 MG Caps capsule Commonly known as: FLOMAX Take 1 capsule (0.4 mg total) by mouth daily for 7 days.   VIACTIV CALCIUM PLUS D PO Take 1 tablet by mouth daily.        Allergies:  Allergies  Allergen Reactions   Pneumovax 23 [Pneumococcal Vac Polyvalent] Swelling    Causes arm swelling, redness.   Prevnar 13 [Pneumococcal 13-Val Conj Vacc] Swelling    Family History: Family History  Problem Relation Age of Onset   Congestive Heart Failure Mother    Colon cancer Father    Esophageal cancer Maternal Uncle    Thyroid cancer Neg Hx     Social History:  reports that she has never smoked. She has never used smokeless tobacco. She reports that she does not currently use alcohol. She reports that she does not use drugs.   Physical Exam: BP 124/77   Pulse 80   Ht 6' (1.829 m)   Wt 208 lb (94.3 kg)   BMI 28.21 kg/m   Constitutional:  Alert and oriented, No acute distress. HEENT: [place] AT, moist mucus membranes.  Trachea midline, no masses. Cardiovascular: No clubbing, cyanosis, or edema. Respiratory: Normal respiratory effort, no increased work of breathing. Skin: No rashes, bruises or suspicious lesions. Neurologic: Grossly intact, no focal deficits, moving all 4 extremities. Psychiatric: Normal mood and affect.  Laboratory Data:  Lab Results  Component Value Date   CREATININE 0.71 08/16/2021    Lab Results  Component Value Date   HGBA1C 6.3 (A) 06/29/2021    Urinalysis Pending   Pertinent Imaging: CLINICAL DATA:  Flank pain   EXAM: CT ABDOMEN AND PELVIS WITHOUT CONTRAST   TECHNIQUE: Multidetector CT imaging of the abdomen and pelvis was performed following the standard protocol without IV contrast.   COMPARISON:  None.   FINDINGS: LOWER CHEST: Normal.   HEPATOBILIARY: Normal hepatic contours. No intra- or extrahepatic biliary dilatation. Status post  cholecystectomy.   PANCREAS: Normal pancreas. No ductal dilatation or peripancreatic fluid collection.   SPLEEN: Normal.   ADRENALS/URINARY TRACT: The adrenal glands are normal. There is a stone within the distal right ureter measuring 3 mm, causing moderate hydroureteronephrosis and mild perinephric stranding. The urinary bladder is normal for degree of distention. No left nephroureterolithiasis.   STOMACH/BOWEL: There is no hiatal hernia. Normal duodenal course and caliber. No small bowel dilatation or inflammation. No focal colonic abnormality. Normal appendix.   VASCULAR/LYMPHATIC: Normal course and caliber of the major abdominal vessels. No abdominal or pelvic lymphadenopathy.   REPRODUCTIVE: Status post hysterectomy. No adnexal mass.   MUSCULOSKELETAL. No bony spinal canal stenosis or focal osseous abnormality.   OTHER: None.   IMPRESSION: Right-sided obstructive uropathy with 3 mm stone in the distal  right ureter causing moderate hydroureteronephrosis and mild perinephric stranding.     Electronically Signed   By: Ulyses Jarred M.D.   On: 08/17/2021 01:06  The above CT scan was personally reviewed.  Agree with radiologic notation.  Assessment & Plan:    78m distal right ureteral stone - UA; pending  -Based on interval resolution of the pain, size and location of the stone, it is highly likely that this is passed in the interim.  We will correlate this with her urinalysis which is still pending. - We discussed general stone prevention techniques including drinking plenty water with goal of producing 2.5 L urine daily, increased citric acid intake, avoidance of high oxalate containing foods, and decreased salt intake.  Information about dietary recommendations given today. Given booklet for stone prevention.  -Return if she develops any further flank pain   I,Kailey Littlejohn,acting as a scribe for AHollice Espy MD.,have documented all relevant documentation on  the behalf of AHollice Espy MD,as directed by  AHollice Espy MD while in the presence of AHollice Espy MD.  I have reviewed the above documentation for accuracy and completeness, and I agree with the above.   AHollice Espy MD   [facility] [address] [place] [phone]

## 2021-08-18 NOTE — Assessment & Plan Note (Signed)
Blood pressure on low end today.  Suspect this is related to weight loss.  Advised patient to trial stop amlodipine 2.5 mg.  She will remain on lisinopril 5 mg for renal protection in setting of diabetes

## 2021-08-19 ENCOUNTER — Ambulatory Visit (INDEPENDENT_AMBULATORY_CARE_PROVIDER_SITE_OTHER): Payer: Medicare HMO | Admitting: Urology

## 2021-08-19 ENCOUNTER — Encounter: Payer: Self-pay | Admitting: Urology

## 2021-08-19 VITALS — BP 124/77 | HR 80 | Ht 72.0 in | Wt 208.0 lb

## 2021-08-19 DIAGNOSIS — N200 Calculus of kidney: Secondary | ICD-10-CM

## 2021-08-20 ENCOUNTER — Telehealth: Payer: Self-pay

## 2021-08-20 LAB — URINALYSIS, COMPLETE
Bilirubin, UA: NEGATIVE
Leukocytes,UA: NEGATIVE
Nitrite, UA: NEGATIVE
Protein,UA: NEGATIVE
RBC, UA: NEGATIVE
Specific Gravity, UA: >1.03 — ABNORMAL HIGH (ref 1.005–1.030)
Urobilinogen, Ur: 0.2 mg/dL (ref 0.2–1.0)
pH, UA: 5 (ref 5.0–7.5)

## 2021-08-20 LAB — MICROSCOPIC EXAMINATION

## 2021-08-20 NOTE — Telephone Encounter (Signed)
Pt calls and states that she is having pain on her right side and it is radiating down to her groin. She states that she is nauseous and had vomiting several times in the past few hours. She denies fever or chills. She states that she has not taken Percocet or Zofran which were both prescribed to her. Strongly advised pt to begin these medications immediately. Advised pt that is he continues to have severe pain and n/v despite use of these medications she should seek care in the ED per Sam. Pt voiced understanding.

## 2021-08-25 ENCOUNTER — Ambulatory Visit (INDEPENDENT_AMBULATORY_CARE_PROVIDER_SITE_OTHER): Payer: Medicare HMO

## 2021-08-25 ENCOUNTER — Other Ambulatory Visit: Payer: Self-pay

## 2021-08-25 DIAGNOSIS — E538 Deficiency of other specified B group vitamins: Secondary | ICD-10-CM

## 2021-08-25 MED ORDER — CYANOCOBALAMIN 1000 MCG/ML IJ SOLN
1000.0000 ug | Freq: Once | INTRAMUSCULAR | Status: AC
  Administered 2021-08-25: 1000 ug via INTRAMUSCULAR

## 2021-08-25 NOTE — Progress Notes (Signed)
Patient presented for B 12 injection to left deltoid, patient voiced no concerns nor showed any signs of distress during injection. 

## 2021-09-02 ENCOUNTER — Encounter: Payer: Self-pay | Admitting: Gastroenterology

## 2021-09-02 ENCOUNTER — Ambulatory Visit (INDEPENDENT_AMBULATORY_CARE_PROVIDER_SITE_OTHER): Payer: Medicare HMO | Admitting: Gastroenterology

## 2021-09-02 ENCOUNTER — Other Ambulatory Visit: Payer: Self-pay

## 2021-09-02 VITALS — BP 157/82 | HR 77 | Temp 97.8°F | Ht 72.0 in | Wt 206.0 lb

## 2021-09-02 DIAGNOSIS — D510 Vitamin B12 deficiency anemia due to intrinsic factor deficiency: Secondary | ICD-10-CM

## 2021-09-02 DIAGNOSIS — R1013 Epigastric pain: Secondary | ICD-10-CM | POA: Diagnosis not present

## 2021-09-02 NOTE — Progress Notes (Signed)
Cephas Darby, MD [address]  [place], [place] [address]  Main: [phone]  Fax: [phone]    Gastroenterology Consultation  Referring Provider:     Burnard Hawthorne, FNP Primary Care Physician:  Burnard Hawthorne, FNP Primary Gastroenterologist:  Dr. Cephas Darby Reason for Consultation:     Dyspepsia, pernicious anemia        HPI:   Danielle Dickerson is a 65 y.o. female referred by Dr. Burnard Hawthorne, FNP  for consultation & management of dyspepsia, colon cancer screening.  Patient reports more than 1 month history of postprandial nausea associated with early satiety, upper abdominal discomfort.  She has history of diabetes, most recent hemoglobin A1c is 8.4, on insulin, started on Victoza about a month ago.  She has moved from [place] to [place] last year.  She has 2 daughters who live here.  She is says it has been stressful in last few months, not able to concentrate on healthy foods.  Her blood sugars are improving, she is working on [employer].  She reports her weaknesses carbs.  Her weight has been stable, worried that she is not losing weight.  Normal TSH, CBC, CMP are normal  She was experiencing difficulty swallowing few years ago, she was told that she has eosinophilic esophagitis based on the EGD several years ago when she was in [place], she had a stricture and it was stretched based on her report.  Since then, she was started on Protonix 40 mg daily.  She denies any lower GI symptoms She is also due for colonoscopy, waiting for her Medicaid to become effective She does not smoke or drink alcohol  Follow up visit 01/07/21: Patient reports that her dyspepsia symptoms have resolved since stopping glipizide.  Her symptoms were attributed to Victoza.  Her hemoglobin A1c is currently down to 6.3.  She is concerned about pills getting stuck in her throat.  She does have incomplete emptying every time she has a BM associated with straining.  She thinks  her hemorrhoids are flaring up, cause itching.  She thinks she has to add more fiber in her diet.  Limited intake of water only  Follow-up visit 09/02/2021 Patient is here to discuss about her recurrence of symptoms of dyspepsia including epigastric discomfort, nausea, vomiting.  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  She is currently taking B12 injections.  Her hemoglobin is normal.  No evidence of iron deficiency.  She is referred to discuss about upper endoscopy.  Patient is currently on low-dose Ozempic.  Her diabetes is fairly under control Patient does not smoke or drink alcohol  NSAIDs: None  Antiplts/Anticoagulants/Anti thrombotics: None  GI Procedures: Colonoscopy 5 years ago, reportedly normal Father with colon cancer at age 33 Colonoscopy 02/02/2021 - The entire examined colon is normal. - Diverticulosis in the sigmoid colon. - The distal rectum and anal verge are normal on retroflexion view. - No specimens collected.  Past Medical History:  Diagnosis Date   COVID-19    05/27/21   Depression    Diabetes mellitus without complication ([place])    Eosinophilic esophagitis    Esophageal stricture 2019   Hyperlipidemia     Past Surgical History:  Procedure Laterality Date   ABDOMINAL HYSTERECTOMY  2006   has ovaries. Had heavy periods and was anemic. HAS cervix   BREAST EXCISIONAL BIOPSY Right 2009   phyllodes removed    CHOLECYSTECTOMY  OPEN     COLONOSCOPY WITH PROPOFOL N/A 02/02/2021   Procedure: COLONOSCOPY WITH PROPOFOL;  Surgeon: Lin Landsman, MD;  Location[facility] END[facility]COPY;  Service: Gastroenterology;  Laterality: N/A;    Current Outpatient Medications:    Accu-Chek FastClix Lancets MISC, Used to check blood sugars twice a day., Disp: 100 each, Rfl: 3   aspirin EC 81 MG tablet, Take 81 mg by mouth daily., Disp: , Rfl:    atorvastatin (LIPITOR) 40 MG tablet, TAKE 1 TABLET BY MOUTH EVERY DAY,  Disp: 90 tablet, Rfl: 1   Blood Glucose Monitoring Suppl (ACCU-CHEK GUIDE ME) w/Device KIT, USE AS DIRECTED, Disp: 1 kit, Rfl: 0   Calcium-Vitamin D-Vitamin K (VIACTIV CALCIUM PLUS D PO), Take 1 tablet by mouth daily., Disp: , Rfl:    citalopram (CELEXA) 20 MG tablet, TAKE 1 TABLET BY MOUTH EVERY DAY, Disp: 90 tablet, Rfl: 2   glipiZIDE (GLUCOTROL XL) 5 MG 24 hr tablet, Take 1 tablet (5 mg total) by mouth daily with breakfast., Disp: 90 tablet, Rfl: 1   glucose blood (ACCU-CHEK AVIVA PLUS) test strip, Used to check blood sugars twice a day., Disp: 100 each, Rfl: 5   Insulin Pen Needle (PEN NEEDLES) 32G X 4 MM MISC, Used to give Victoza injections., Disp: 100 each, Rfl: 1   Lancets (ONETOUCH ULTRASOFT) lancets, Used to check blood sugars once a day., Disp: 100 each, Rfl: 4   lisinopril (ZESTRIL) 5 MG tablet, TAKE 1 TABLET BY MOUTH EVERY DAY, Disp: 90 tablet, Rfl: 1   metFORMIN (GLUCOPHAGE) 1000 MG tablet, TAKE 1 TABLET (1,000 MG TOTAL) BY MOUTH 2 (TWO) TIMES DAILY WITH A MEAL., Disp: 180 tablet, Rfl: 1   Multiple Vitamins-Minerals (CENTRUM SILVER ADULT 50+ PO), Take 1 tablet by mouth daily., Disp: , Rfl:    ondansetron (ZOFRAN ODT) 4 MG disintegrating tablet, Take 1 tablet (4 mg total) by mouth every 8 (eight) hours as needed., Disp: 20 tablet, Rfl: 0   oxyCODONE-acetaminophen (PERCOCET) 5-325 MG tablet, Take 1 tablet by mouth every 4 (four) hours as needed., Disp: 12 tablet, Rfl: 0   pantoprazole (PROTONIX) 20 MG tablet, Take 1 tablet (20 mg total) by mouth 2 (two) times daily before a meal., Disp: 120 tablet, Rfl: 1   Semaglutide,0.25 or 0.5MG/DOS, (OZEMPIC, 0.25 OR 0.5 MG/DOSE,) 2 MG/1.5ML SOPN, Inject 0.5 mg into the skin once a week., Disp: 3 mL, Rfl: 3   Family History  Problem Relation Age of Onset   Congestive Heart Failure Mother    Colon cancer Father    Esophageal cancer Maternal Uncle    Thyroid cancer Neg Hx      Social History   Tobacco Use   Smoking status: Never    Smokeless tobacco: Never  Vaping Use   Vaping Use: Never used  Substance Use Topics   Alcohol use: Not Currently   Drug use: Never    Allergies as of 09/02/2021 - Review Complete 09/02/2021  Allergen Reaction Noted   Pneumovax 23 [pneumococcal vac polyvalent] Swelling 05/18/2021   Prevnar 13 [pneumococcal 13-val conj vacc] Swelling 05/16/2021    Review of Systems:    All systems reviewed and negative except where noted in HPI.   Physical Exam:  BP (!) 157/82 (BP Location: Right Arm, Patient Position: Sitting, Cuff Size: Normal)   Pulse 77   Temp 97.8 F (36.6 C) (Oral)   Ht 6' (1.829 m)   Wt 206 lb (93.4 kg)   BMI 27.94 kg/m  No LMP recorded. Patient  has had a hysterectomy.  General:   Alert,  Well-developed, well-nourished, pleasant and cooperative in NAD Head:  Normocephalic and atraumatic. Eyes:  Sclera clear, no icterus.   Conjunctiva pink. Ears:  Normal auditory acuity. Nose:  No deformity, discharge, or lesions. Mouth:  No deformity or lesions,oropharynx pink & moist. Neck:  Supple; no masses or thyromegaly. Lungs:  Respirations even and unlabored.  Clear throughout to auscultation.   No wheezes, crackles, or rhonchi. No acute distress. Heart:  Regular rate and rhythm; no murmurs, clicks, rubs, or gallops. Abdomen:  Normal bowel sounds. Soft, obese, non-tender and non-distended without masses, hepatosplenomegaly or hernias noted.  No guarding or rebound tenderness.   Rectal: Not performed Msk:  Symmetrical without gross deformities. Good, equal movement & strength bilaterally. Pulses:  Normal pulses noted. Extremities:  No clubbing or edema.  No cyanosis. Neurologic:  Alert and oriented x3;  grossly normal neurologically. Skin:  Intact without significant lesions or rashes. No jaundice. Psych:  Alert and cooperative. Normal mood and affect.  Imaging Studies: No abdominal imaging  Assessment and Plan:   Permelia Bamba is a 65 y.o. pleasant Caucasian female with  metabolic syndrome, family history of colon cancer in father at age 39, ?  Eosinophilic esophagitis, esophageal stricture s/p dilation in the past for dysphagia is seen in consultation for symptoms of dyspepsia.  Patient is no longer experiencing difficulty swallowing  Dyspepsia and pernicious anemia Symptoms of dyspepsia are fairly under control on Protonix 20 mg in the morning and Pepcid at night Recommend EGD with gastric and duodenal biopsies Check serum vitamin B12 levels If EGD is unremarkable, recommend gastric emptying study   Family history of colon cancer in first-degree relative Recommend screening colonoscopy in 01/2026  Follow up in 4 months   Cephas Darby, MD

## 2021-09-03 ENCOUNTER — Encounter: Payer: Self-pay | Admitting: Family

## 2021-09-03 LAB — VITAMIN B12: Vitamin B-12: 376 pg/mL (ref 232–1245)

## 2021-09-07 ENCOUNTER — Other Ambulatory Visit: Payer: Self-pay | Admitting: *Deleted

## 2021-09-07 ENCOUNTER — Encounter: Payer: Self-pay | Admitting: Urology

## 2021-09-07 ENCOUNTER — Inpatient Hospital Stay
Admission: EM | Admit: 2021-09-07 | Discharge: 2021-09-10 | DRG: 854 | Disposition: A | Discharge status: Home / Self Care | Payer: Medicare HMO | Source: Ambulatory Visit | Attending: Urology | Admitting: Urology

## 2021-09-07 ENCOUNTER — Ambulatory Visit
Admission: RE | Admit: 2021-09-07 | Discharge: 2021-09-07 | Disposition: A | Discharge status: Home / Self Care | Payer: Medicare HMO | Source: Home / Self Care | Attending: Urology | Admitting: Urology

## 2021-09-07 ENCOUNTER — Observation Stay: Payer: Medicare HMO

## 2021-09-07 ENCOUNTER — Observation Stay: Payer: Medicare HMO | Admitting: Anesthesiology

## 2021-09-07 ENCOUNTER — Other Ambulatory Visit
Admission: RE | Admit: 2021-09-07 | Discharge: 2021-09-07 | Disposition: A | Discharge status: Home / Self Care | Payer: Medicare HMO | Source: Home / Self Care | Attending: Urology | Admitting: Urology

## 2021-09-07 ENCOUNTER — Ambulatory Visit (INDEPENDENT_AMBULATORY_CARE_PROVIDER_SITE_OTHER): Payer: Medicare HMO | Admitting: Urology

## 2021-09-07 ENCOUNTER — Emergency Department: Payer: Medicare HMO

## 2021-09-07 ENCOUNTER — Encounter
Admission: EM | Disposition: A | Discharge status: Home / Self Care | Payer: Self-pay | Source: Ambulatory Visit | Attending: Urology

## 2021-09-07 ENCOUNTER — Other Ambulatory Visit: Payer: Self-pay

## 2021-09-07 ENCOUNTER — Ambulatory Visit
Admission: RE | Admit: 2021-09-07 | Discharge: 2021-09-07 | Disposition: A | Discharge status: Home / Self Care | Payer: Medicare HMO | Source: Ambulatory Visit | Attending: Urology | Admitting: Urology

## 2021-09-07 VITALS — BP 88/59 | HR 102 | Ht 72.0 in | Wt 204.0 lb

## 2021-09-07 DIAGNOSIS — N132 Hydronephrosis with renal and ureteral calculous obstruction: Secondary | ICD-10-CM | POA: Diagnosis not present

## 2021-09-07 DIAGNOSIS — Z794 Long term (current) use of insulin: Secondary | ICD-10-CM

## 2021-09-07 DIAGNOSIS — N136 Pyonephrosis: Secondary | ICD-10-CM | POA: Diagnosis present

## 2021-09-07 DIAGNOSIS — N179 Acute kidney failure, unspecified: Secondary | ICD-10-CM | POA: Diagnosis not present

## 2021-09-07 DIAGNOSIS — A4151 Sepsis due to Escherichia coli [E. coli]: Secondary | ICD-10-CM | POA: Diagnosis not present

## 2021-09-07 DIAGNOSIS — Z8616 Personal history of COVID-19: Secondary | ICD-10-CM

## 2021-09-07 DIAGNOSIS — Z79899 Other long term (current) drug therapy: Secondary | ICD-10-CM

## 2021-09-07 DIAGNOSIS — Z7984 Long term (current) use of oral hypoglycemic drugs: Secondary | ICD-10-CM

## 2021-09-07 DIAGNOSIS — N200 Calculus of kidney: Secondary | ICD-10-CM

## 2021-09-07 DIAGNOSIS — D7289 Other specified disorders of white blood cells: Secondary | ICD-10-CM | POA: Diagnosis not present

## 2021-09-07 DIAGNOSIS — N201 Calculus of ureter: Secondary | ICD-10-CM

## 2021-09-07 DIAGNOSIS — E1165 Type 2 diabetes mellitus with hyperglycemia: Secondary | ICD-10-CM | POA: Diagnosis present

## 2021-09-07 DIAGNOSIS — A419 Sepsis, unspecified organism: Secondary | ICD-10-CM | POA: Diagnosis not present

## 2021-09-07 DIAGNOSIS — R739 Hyperglycemia, unspecified: Secondary | ICD-10-CM

## 2021-09-07 DIAGNOSIS — Z7982 Long term (current) use of aspirin: Secondary | ICD-10-CM

## 2021-09-07 DIAGNOSIS — K219 Gastro-esophageal reflux disease without esophagitis: Secondary | ICD-10-CM | POA: Diagnosis present

## 2021-09-07 DIAGNOSIS — N134 Hydroureter: Secondary | ICD-10-CM

## 2021-09-07 DIAGNOSIS — N390 Urinary tract infection, site not specified: Secondary | ICD-10-CM | POA: Diagnosis not present

## 2021-09-07 DIAGNOSIS — I10 Essential (primary) hypertension: Secondary | ICD-10-CM | POA: Diagnosis present

## 2021-09-07 DIAGNOSIS — Z800 Family history of malignant neoplasm of digestive organs: Secondary | ICD-10-CM

## 2021-09-07 DIAGNOSIS — Z90710 Acquired absence of both cervix and uterus: Secondary | ICD-10-CM

## 2021-09-07 DIAGNOSIS — E785 Hyperlipidemia, unspecified: Secondary | ICD-10-CM | POA: Diagnosis present

## 2021-09-07 HISTORY — PX: CYSTOSCOPY WITH STENT PLACEMENT: SHX5790

## 2021-09-07 LAB — CBG MONITORING, ED
Glucose-Capillary: 170 mg/dL — ABNORMAL HIGH (ref 70–99)
Glucose-Capillary: 175 mg/dL — ABNORMAL HIGH (ref 70–99)

## 2021-09-07 LAB — CBC
HCT: 32.2 % — ABNORMAL LOW (ref 36.0–46.0)
Hemoglobin: 11 g/dL — ABNORMAL LOW (ref 12.0–15.0)
MCH: 29.3 pg (ref 26.0–34.0)
MCHC: 34.2 g/dL (ref 30.0–36.0)
MCV: 85.9 fL (ref 80.0–100.0)
Platelets: 149 10*3/uL — ABNORMAL LOW (ref 150–400)
RBC: 3.75 MIL/uL — ABNORMAL LOW (ref 3.87–5.11)
RDW: 13.2 % (ref 11.5–15.5)
WBC: 14.2 10*3/uL — ABNORMAL HIGH (ref 4.0–10.5)
nRBC: 0 % (ref 0.0–0.2)

## 2021-09-07 LAB — RESP PANEL BY RT-PCR (FLU A&B, COVID) ARPGX2
Influenza A by PCR: NEGATIVE
Influenza B by PCR: NEGATIVE
SARS Coronavirus 2 by RT PCR: NEGATIVE

## 2021-09-07 LAB — URINALYSIS, COMPLETE (UACMP) WITH MICROSCOPIC
Glucose, UA: 250 mg/dL — AB
Ketones, ur: NEGATIVE mg/dL
Nitrite: NEGATIVE
Protein, ur: >300 mg/dL — AB
Specific Gravity, Urine: >1.03 — ABNORMAL HIGH (ref 1.005–1.030)
WBC, UA: >50 WBC/hpf (ref 0–5)
pH: 5 (ref 5.0–8.0)

## 2021-09-07 LAB — BASIC METABOLIC PANEL
Anion gap: 11 (ref 5–15)
BUN: 25 mg/dL — ABNORMAL HIGH (ref 8–23)
CO2: 25 mmol/L (ref 22–32)
Calcium: 8.9 mg/dL (ref 8.9–10.3)
Chloride: 96 mmol/L — ABNORMAL LOW (ref 98–111)
Creatinine, Ser: 1.48 mg/dL — ABNORMAL HIGH (ref 0.44–1.00)
GFR, Estimated: 39 mL/min — ABNORMAL LOW (ref >60)
Glucose, Bld: 224 mg/dL — ABNORMAL HIGH (ref 70–99)
Potassium: 4.1 mmol/L (ref 3.5–5.1)
Sodium: 132 mmol/L — ABNORMAL LOW (ref 135–145)

## 2021-09-07 LAB — GLUCOSE, CAPILLARY: Glucose-Capillary: 260 mg/dL — ABNORMAL HIGH (ref 70–99)

## 2021-09-07 LAB — LACTIC ACID, PLASMA: Lactic Acid, Venous: 1.6 mmol/L (ref 0.5–1.9)

## 2021-09-07 LAB — HIV ANTIBODY (ROUTINE TESTING W REFLEX): HIV Screen 4th Generation wRfx: NONREACTIVE

## 2021-09-07 SURGERY — CYSTOSCOPY, WITH STENT INSERTION
Anesthesia: General | Site: Ureter | Laterality: Right

## 2021-09-07 MED ORDER — DIPHENHYDRAMINE HCL 50 MG/ML IJ SOLN: 12.5000 mg | Freq: Four times a day (QID) | INTRAMUSCULAR | Status: DC | PRN

## 2021-09-07 MED ORDER — LACTATED RINGERS IV SOLN: INTRAVENOUS | Status: AC

## 2021-09-07 MED ORDER — SODIUM CHLORIDE 0.9 % IV BOLUS
250.0000 mL | INTRAVENOUS | Status: AC
  Administered 2021-09-07: 250 mL via INTRAVENOUS

## 2021-09-07 MED ORDER — BELLADONNA ALKALOIDS-OPIUM 16.2-60 MG RE SUPP
1.0000 | Freq: Four times a day (QID) | RECTAL | Status: DC | PRN
Start: 2021-09-07 — End: 2021-09-10

## 2021-09-07 MED ORDER — SODIUM CHLORIDE 0.9 % IV SOLN: Freq: Once | INTRAVENOUS | Status: AC

## 2021-09-07 MED ORDER — INSULIN ASPART 100 UNIT/ML IJ SOLN
4.0000 [IU] | Freq: Three times a day (TID) | INTRAMUSCULAR | Status: DC
  Administered 2021-09-08 – 2021-09-09 (×4): 4 [IU] via SUBCUTANEOUS
  Filled 2021-09-07 (×5): qty 1

## 2021-09-07 MED ORDER — ACETAMINOPHEN 10 MG/ML IV SOLN
INTRAVENOUS | Status: AC
  Filled 2021-09-07: qty 100

## 2021-09-07 MED ORDER — PROPOFOL 10 MG/ML IV BOLUS
INTRAVENOUS | Status: AC
  Filled 2021-09-07: qty 20

## 2021-09-07 MED ORDER — DIPHENHYDRAMINE HCL 12.5 MG/5ML PO ELIX
12.5000 mg | ORAL_SOLUTION | Freq: Four times a day (QID) | ORAL | Status: DC | PRN
  Filled 2021-09-07: qty 5

## 2021-09-07 MED ORDER — OXYBUTYNIN CHLORIDE 5 MG PO TABS: 5.0000 mg | ORAL_TABLET | Freq: Three times a day (TID) | ORAL | Status: DC | PRN

## 2021-09-07 MED ORDER — DEXAMETHASONE SODIUM PHOSPHATE 10 MG/ML IJ SOLN
INTRAMUSCULAR | Status: AC
  Filled 2021-09-07: qty 1

## 2021-09-07 MED ORDER — ONDANSETRON HCL 4 MG/2ML IJ SOLN
4.0000 mg | INTRAMUSCULAR | Status: DC | PRN
  Administered 2021-09-08 – 2021-09-10 (×3): 4 mg via INTRAVENOUS
  Filled 2021-09-07 (×2): qty 2

## 2021-09-07 MED ORDER — STERILE WATER FOR IRRIGATION IR SOLN
Status: DC | PRN
  Administered 2021-09-07: 500 mL

## 2021-09-07 MED ORDER — DEXAMETHASONE SODIUM PHOSPHATE 10 MG/ML IJ SOLN
INTRAMUSCULAR | Status: DC | PRN
  Administered 2021-09-07: 10 mg via INTRAVENOUS

## 2021-09-07 MED ORDER — FENTANYL CITRATE (PF) 100 MCG/2ML IJ SOLN
INTRAMUSCULAR | Status: DC | PRN
  Administered 2021-09-07 (×2): 50 ug via INTRAVENOUS

## 2021-09-07 MED ORDER — MORPHINE SULFATE (PF) 2 MG/ML IV SOLN: 2.0000 mg | INTRAVENOUS | Status: DC | PRN

## 2021-09-07 MED ORDER — ONDANSETRON HCL 4 MG/2ML IJ SOLN
4.0000 mg | Freq: Once | INTRAMUSCULAR | Status: DC
Start: 2021-09-07 — End: 2021-09-10
  Filled 2021-09-07: qty 2

## 2021-09-07 MED ORDER — PROPOFOL 10 MG/ML IV BOLUS
INTRAVENOUS | Status: DC | PRN
  Administered 2021-09-07: 150 mg via INTRAVENOUS
  Administered 2021-09-07: 50 mg via INTRAVENOUS

## 2021-09-07 MED ORDER — HEPARIN SODIUM (PORCINE) 5000 UNIT/ML IJ SOLN
5000.0000 [IU] | Freq: Three times a day (TID) | INTRAMUSCULAR | Status: DC
  Administered 2021-09-07 – 2021-09-10 (×7): 5000 [IU] via SUBCUTANEOUS
  Filled 2021-09-07 (×7): qty 1

## 2021-09-07 MED ORDER — SODIUM CHLORIDE 0.9 % IV SOLN: INTRAVENOUS | Status: DC

## 2021-09-07 MED ORDER — INSULIN ASPART 100 UNIT/ML IJ SOLN
0.0000 [IU] | Freq: Every day | INTRAMUSCULAR | Status: DC
  Administered 2021-09-07: 3 [IU] via SUBCUTANEOUS
  Filled 2021-09-07: qty 1

## 2021-09-07 MED ORDER — ONDANSETRON HCL 4 MG/2ML IJ SOLN
INTRAMUSCULAR | Status: DC | PRN
  Administered 2021-09-07: 4 mg via INTRAVENOUS

## 2021-09-07 MED ORDER — LIDOCAINE HCL (CARDIAC) PF 100 MG/5ML IV SOSY
PREFILLED_SYRINGE | INTRAVENOUS | Status: DC | PRN
  Administered 2021-09-07: 80 mg via INTRAVENOUS

## 2021-09-07 MED ORDER — SODIUM CHLORIDE 0.9 % IV SOLN: 1.0000 g | INTRAVENOUS | Status: DC

## 2021-09-07 MED ORDER — MIDAZOLAM HCL 2 MG/2ML IJ SOLN
INTRAMUSCULAR | Status: AC
  Filled 2021-09-07: qty 2

## 2021-09-07 MED ORDER — MIDAZOLAM HCL 2 MG/2ML IJ SOLN
INTRAMUSCULAR | Status: DC | PRN
  Administered 2021-09-07: 2 mg via INTRAVENOUS

## 2021-09-07 MED ORDER — SODIUM CHLORIDE 0.9 % IR SOLN
Status: DC | PRN
  Administered 2021-09-07: 3000 mL via INTRAVESICAL

## 2021-09-07 MED ORDER — SODIUM CHLORIDE 0.9 % IV SOLN
1.0000 g | Freq: Once | INTRAVENOUS | Status: AC
  Administered 2021-09-07: 1 g via INTRAVENOUS
  Filled 2021-09-07 (×2): qty 10

## 2021-09-07 MED ORDER — INSULIN ASPART 100 UNIT/ML IJ SOLN: 0.0000 [IU] | INTRAMUSCULAR | Status: DC

## 2021-09-07 MED ORDER — DOCUSATE SODIUM 100 MG PO CAPS
100.0000 mg | ORAL_CAPSULE | Freq: Two times a day (BID) | ORAL | Status: DC
  Administered 2021-09-07 – 2021-09-09 (×4): 100 mg via ORAL
  Filled 2021-09-07 (×6): qty 1

## 2021-09-07 MED ORDER — INSULIN ASPART 100 UNIT/ML IJ SOLN
0.0000 [IU] | Freq: Three times a day (TID) | INTRAMUSCULAR | Status: DC
  Administered 2021-09-08: 5 [IU] via SUBCUTANEOUS
  Administered 2021-09-08 – 2021-09-09 (×2): 3 [IU] via SUBCUTANEOUS
  Administered 2021-09-09: 2 [IU] via SUBCUTANEOUS
  Filled 2021-09-07 (×5): qty 1

## 2021-09-07 MED ORDER — IOHEXOL 180 MG/ML  SOLN
INTRAMUSCULAR | Status: DC | PRN
  Administered 2021-09-07: 4 mL

## 2021-09-07 MED ORDER — SUCCINYLCHOLINE CHLORIDE 200 MG/10ML IV SOSY
PREFILLED_SYRINGE | INTRAVENOUS | Status: DC | PRN
  Administered 2021-09-07: 120 mg via INTRAVENOUS

## 2021-09-07 MED ORDER — ACETAMINOPHEN 10 MG/ML IV SOLN
INTRAVENOUS | Status: DC | PRN
Start: 2021-09-07 — End: 2021-09-07
  Administered 2021-09-07: 1000 mg via INTRAVENOUS

## 2021-09-07 MED ORDER — FENTANYL CITRATE (PF) 100 MCG/2ML IJ SOLN
INTRAMUSCULAR | Status: AC
  Filled 2021-09-07: qty 2

## 2021-09-07 MED ORDER — FENTANYL CITRATE (PF) 100 MCG/2ML IJ SOLN: 25.0000 ug | INTRAMUSCULAR | Status: DC | PRN

## 2021-09-07 MED ORDER — OXYCODONE-ACETAMINOPHEN 5-325 MG PO TABS: 1.0000 | ORAL_TABLET | ORAL | Status: DC | PRN

## 2021-09-07 MED ORDER — LIDOCAINE HCL (PF) 2 % IJ SOLN
INTRAMUSCULAR | Status: AC
  Filled 2021-09-07: qty 5

## 2021-09-07 MED ORDER — ONDANSETRON HCL 4 MG/2ML IJ SOLN
INTRAMUSCULAR | Status: AC
  Filled 2021-09-07: qty 2

## 2021-09-07 MED ORDER — ONDANSETRON HCL 4 MG/2ML IJ SOLN: 4.0000 mg | Freq: Once | INTRAMUSCULAR | Status: DC | PRN

## 2021-09-07 MED ORDER — ACETAMINOPHEN 325 MG PO TABS
650.0000 mg | ORAL_TABLET | ORAL | Status: DC | PRN
  Administered 2021-09-08 – 2021-09-09 (×5): 650 mg via ORAL
  Filled 2021-09-07 (×6): qty 2

## 2021-09-07 MED ORDER — LACTATED RINGERS IV BOLUS: 1000.0000 mL | Freq: Once | INTRAVENOUS | Status: DC

## 2021-09-07 MED ORDER — MORPHINE SULFATE (PF) 4 MG/ML IV SOLN: 4.0000 mg | Freq: Once | INTRAVENOUS | Status: DC

## 2021-09-07 SURGICAL SUPPLY — 19 items
BAG DRAIN CYSTO-URO LG1000N (MISCELLANEOUS) ×2 IMPLANT
BRUSH SCRUB EZ 1% IODOPHOR (MISCELLANEOUS) ×2 IMPLANT
CATH URETL OPEN 5X70 (CATHETERS) ×2 IMPLANT
GAUZE 4X4 16PLY ~~LOC~~+RFID DBL (SPONGE) IMPLANT
GLOVE SURG ENC MOIS LTX SZ6.5 (GLOVE) ×6 IMPLANT
GOWN STRL REUS W/ TWL LRG LVL3 (GOWN DISPOSABLE) ×2 IMPLANT
GOWN STRL REUS W/TWL LRG LVL3 (GOWN DISPOSABLE) ×2
GUIDEWIRE STR DUAL SENSOR (WIRE) ×2 IMPLANT
IV NS IRRIG 3000ML ARTHROMATIC (IV SOLUTION) ×2 IMPLANT
KIT TURNOVER CYSTO (KITS) ×2 IMPLANT
PACK CYSTO AR (MISCELLANEOUS) ×2 IMPLANT
SET CYSTO W/LG BORE CLAMP LF (SET/KITS/TRAYS/PACK) ×2 IMPLANT
STENT URET 6FRX24 CONTOUR (STENTS) ×2 IMPLANT
STENT URET 6FRX26 CONTOUR (STENTS) IMPLANT
SURGILUBE 2OZ TUBE FLIPTOP (MISCELLANEOUS) ×2 IMPLANT
SYR TOOMEY IRRIG 70ML (MISCELLANEOUS) ×2
SYRINGE TOOMEY IRRIG 70ML (MISCELLANEOUS) ×1 IMPLANT
WATER STERILE IRR 1000ML POUR (IV SOLUTION) IMPLANT
WATER STERILE IRR 500ML POUR (IV SOLUTION) ×2 IMPLANT

## 2021-09-07 NOTE — ED Notes (Signed)
Report given to [facility] in [facility]. Pt informed of plans for surgery.

## 2021-09-07 NOTE — ED Provider Notes (Signed)
Emergency Medicine Provider Triage Evaluation Note  Danielle Dickerson , a 65 y.o. female  was evaluated in triage.  Pt complains of returns to the ED at the advice of her urologist, after recurrent flank pain and urinary retention. She was evaluated 3 weeks ago for nephrolithiasis and returns today for concern for possible infected stone, after sudden increase in pain on Saturday.   Review of Systems  Positive: Flank pain, N/V Negative: FCS  Physical Exam  BP 101/67   Pulse 83   Temp 98 F (36.7 C)   Resp 19   SpO2 100%  Gen:   Awake, no distress  NAD Resp:  Normal effort CT MSK:   Moves extremities without difficulty  Other:  ABD: Soft, nontender  Medical Decision Making  Medically screening exam initiated at 1:23 PM.  Appropriate orders placed.  Danielle Dickerson was informed that the remainder of the evaluation will be completed by another provider, this initial triage assessment does not replace that evaluation, and the importance of remaining in the ED until their evaluation is complete.  Patient with ED evaluation of flank pain and urologist concern for infected stone.    Lissa Hoard, PA-C 09/07/21 1325    Willy Eddy, MD 09/07/21 [phone]

## 2021-09-07 NOTE — Progress Notes (Signed)
09/07/2021 12:19 PM   Danielle Dickerson Jan 13, 1956 [id number]  Referring provider: Burnard Hawthorne, FNP [address] [place],  [place] [address]  Chief Complaint  Patient presents with   Follow-up   Nephrolithiasis   Urological history: 1. Nephrolithiasis -CT renal stone study 07/2021- 3 mm right distal stone   HPI: Danielle Dickerson is a 65 y.o. female who presents today for right sided pain radiating into her groin area.   CT renal stone study - 3 mm right distal stone with hydronephrosis   UA > 50 WBC's, 6-10 RBC's, many bacteria and WBC clumps.   KUB small calcification in the vicinity of the right distal stone seen on CT, but it may also be a phlebolith.  Over the weekend, she started to experience right flank pain that radiates to the right groin.  She also has been having nausea, vomiting and shaking chills over the weekend.    She states she continues to feel worse was sitting here in the office and she states this is much worse than when she was seen in the ED on 08/17/2021.   PMH: Past Medical History:  Diagnosis Date   COVID-19    05/27/21   Depression    Diabetes mellitus without complication ([place])    Eosinophilic esophagitis    Esophageal stricture 2019   Hyperlipidemia     Surgical History: Past Surgical History:  Procedure Laterality Date   ABDOMINAL HYSTERECTOMY  2006   has ovaries. Had heavy periods and was anemic. HAS cervix   BREAST EXCISIONAL BIOPSY Right 2009   phyllodes removed    CHOLECYSTECTOMY OPEN     COLONOSCOPY WITH PROPOFOL N/A 02/02/2021   Procedure: COLONOSCOPY WITH PROPOFOL;  Surgeon: Lin Landsman, MD;  Location: [facility] ENDOSCOPY;  Service: Gastroenterology;  Laterality: N/A;    Home Medications:  Allergies as of 09/07/2021       Reactions   Pneumovax 23 [pneumococcal Vac Polyvalent] Swelling   Causes arm swelling, redness.   Prevnar 13 [pneumococcal 13-val Conj Vacc] Swelling        Medication List         Accurate as of September 07, 2021 12:19 PM. If you have any questions, ask your nurse or doctor.          Accu-Chek Aviva Plus test strip Generic drug: glucose blood Used to check blood sugars twice a day.   Accu-Chek Guide Me w/Device Kit USE AS DIRECTED   aspirin EC 81 MG tablet Take 81 mg by mouth daily.   atorvastatin 40 MG tablet Commonly known as: LIPITOR TAKE 1 TABLET BY MOUTH EVERY DAY   CENTRUM SILVER ADULT 50+ PO Take 1 tablet by mouth daily.   citalopram 20 MG tablet Commonly known as: CELEXA TAKE 1 TABLET BY MOUTH EVERY DAY   glipiZIDE 5 MG 24 hr tablet Commonly known as: GLUCOTROL XL Take 1 tablet (5 mg total) by mouth daily with breakfast.   lisinopril 5 MG tablet Commonly known as: ZESTRIL TAKE 1 TABLET BY MOUTH EVERY DAY   metFORMIN 1000 MG tablet Commonly known as: GLUCOPHAGE TAKE 1 TABLET (1,000 MG TOTAL) BY MOUTH 2 (TWO) TIMES DAILY WITH A MEAL.   ondansetron 4 MG disintegrating tablet Commonly known as: Zofran ODT Take 1 tablet (4 mg total) by mouth every 8 (eight) hours as needed.   onetouch ultrasoft lancets Used to check blood sugars once a day.   Accu-Chek FastClix Lancets Misc Used to check blood sugars twice a  day.   oxyCODONE-acetaminophen 5-325 MG tablet Commonly known as: Percocet Take 1 tablet by mouth every 4 (four) hours as needed.   Ozempic (0.25 or 0.5 MG/DOSE) 2 MG/1.5ML Sopn Generic drug: Semaglutide(0.25 or 0.5MG/DOS) Inject 0.5 mg into the skin once a week.   pantoprazole 20 MG tablet Commonly known as: PROTONIX Take 1 tablet (20 mg total) by mouth 2 (two) times daily before a meal.   Pen Needles 32G X 4 MM Misc Used to give Victoza injections.   VIACTIV CALCIUM PLUS D PO Take 1 tablet by mouth daily.        Allergies:  Allergies  Allergen Reactions   Pneumovax 23 [Pneumococcal Vac Polyvalent] Swelling    Causes arm swelling, redness.   Prevnar 13 [Pneumococcal 13-Val Conj Vacc] Swelling    Family  History: Family History  Problem Relation Age of Onset   Congestive Heart Failure Mother    Colon cancer Father    Esophageal cancer Maternal Uncle    Thyroid cancer Neg Hx     Social History:  reports that she has never smoked. She has never used smokeless tobacco. She reports that she does not currently use alcohol. She reports that she does not use drugs.  ROS: Pertinent ROS in HPI  Physical Exam: BP (!) 88/59   Pulse (!) 102   Ht 6' (1.829 m)   Wt 204 lb (92.5 kg)   BMI 27.67 kg/m   Constitutional:  Well nourished. Alert and oriented, Does not look well.  HEENT:  AT, mask in place.  Trachea midline Cardiovascular: No clubbing, cyanosis, or edema. Respiratory: Normal respiratory effort, no increased work of breathing. Neurologic: Grossly intact, no focal deficits, moving all 4 extremities. Psychiatric: Normal mood and affect.    Laboratory Data: Lab Results  Component Value Date   WBC 9.3 08/16/2021   HGB 12.3 08/16/2021   HCT 34.4 (L) 08/16/2021   MCV 83.9 08/16/2021   PLT 194 08/16/2021    Lab Results  Component Value Date   CREATININE 0.71 08/16/2021    Lab Results  Component Value Date   HGBA1C 6.3 (A) 06/29/2021    Lab Results  Component Value Date   TSH 1.48 06/16/2021       Component Value Date/Time   CHOL 107 06/16/2021 0800   HDL 44.00 06/16/2021 0800   CHOLHDL 2 06/16/2021 0800   VLDL 12.6 06/16/2021 0800   LDLCALC 50 06/16/2021 0800    Lab Results  Component Value Date   AST 25 08/16/2021   Lab Results  Component Value Date   ALT 21 08/16/2021    Urinalysis Component     Latest Ref Rng & Units 09/07/2021  Color, Urine     YELLOW YELLOW  Appearance     CLEAR CLOUDY (A)  Specific Gravity, Urine     1.005 - 1.030 >1.030 (H)  pH     5.0 - 8.0 5.0  Glucose, UA     NEGATIVE mg/dL 250 (A)  Hgb urine dipstick     NEGATIVE MODERATE (A)  Bilirubin Urine     NEGATIVE SMALL (A)  Ketones, ur     NEGATIVE mg/dL NEGATIVE   Protein     NEGATIVE mg/dL >300 (A)  Nitrite     NEGATIVE NEGATIVE  Leukocytes,Ua     NEGATIVE MODERATE (A)  Squamous Epithelial / LPF     0 - 5 0-5  WBC, UA     0 - 5 WBC/hpf >50  RBC / HPF  0 - 5 RBC/hpf 6-10  Bacteria, UA     NONE SEEN MANY (A)  WBC Clumps      PRESENT  I have reviewed the labs.   Pertinent Imaging: CLINICAL DATA:  Flank pain   EXAM: CT ABDOMEN AND PELVIS WITHOUT CONTRAST   TECHNIQUE: Multidetector CT imaging of the abdomen and pelvis was performed following the standard protocol without IV contrast.   COMPARISON:  None.   FINDINGS: LOWER CHEST: Normal.   HEPATOBILIARY: Normal hepatic contours. No intra- or extrahepatic biliary dilatation. Status post cholecystectomy.   PANCREAS: Normal pancreas. No ductal dilatation or peripancreatic fluid collection.   SPLEEN: Normal.   ADRENALS/URINARY TRACT: The adrenal glands are normal. There is a stone within the distal right ureter measuring 3 mm, causing moderate hydroureteronephrosis and mild perinephric stranding. The urinary bladder is normal for degree of distention. No left nephroureterolithiasis.   STOMACH/BOWEL: There is no hiatal hernia. Normal duodenal course and caliber. No small bowel dilatation or inflammation. No focal colonic abnormality. Normal appendix.   VASCULAR/LYMPHATIC: Normal course and caliber of the major abdominal vessels. No abdominal or pelvic lymphadenopathy.   REPRODUCTIVE: Status post hysterectomy. No adnexal mass.   MUSCULOSKELETAL. No bony spinal canal stenosis or focal osseous abnormality.   OTHER: None.   IMPRESSION: Right-sided obstructive uropathy with 3 mm stone in the distal right ureter causing moderate hydroureteronephrosis and mild perinephric stranding.     Electronically Signed   By: Ulyses Jarred M.D.   On: 08/17/2021 01:06  No definite stone seen on today's KUB I have independently reviewed the films.  See HPI.   Assessment &  Plan:    1. Right ureteral stone with sepsis  -In the setting of hypotensive, tachycardia and grossly infected urine I did not feel comfortable performing stat work-up on her today, but rather sent her to the emergency room so that she may be monitored more closely as I fear she is septic-ED has been notified -I did explain to her that what will likely happen today is she will have an emergent stent placement in order to decompress the urinary system, but if the stone is still present it would not be addressed at this time as she will need 2 weeks of antibiotic therapy to clear the infection -I did describe to her the common side effect of stent discomfort while the stent is in place and that we typically prescribe medications to help patients deal with the stent discomfort as it will most likely be in for 2 weeks -UA grossly infected -Urine sent for culture -patient will seek further care in the ED for IV fluids, antibiotics and imaging for preparation for likely emergent stent placement     These notes generated with voice recognition software. I apologize for typographical errors.  Zara Council, PA-C  [facility] [address]  [place], [place] [address] [phone]

## 2021-09-07 NOTE — H&P (Addendum)
Chief Complaint: right flank pain  History of Present Illness: Danielle Dickerson is a 65 y.o. year old female with a 3 mm right distal ureteral calculus that was thought to possibly have already passed it who presented to the clinic earlier today with concerns for sepsis including hypotension (88/59), tachycardia (102), and severe flank pain.  Her urine was grossly infected and a culture was sent.  She was referred to the emergency room for further labs and management.  Please see Danielle Dickerson's note from earlier today for further details.    In the emergency room, she was found to have a leukocytosis, acute kidney injury as well as a persistent right 3 mm distal ureteral calculus with proximal hydroureteronephrosis.   Past Medical History:  Diagnosis Date   COVID-19    05/27/21   Depression    Diabetes mellitus without complication ([facility])    Eosinophilic esophagitis    Esophageal stricture 2019   Hyperlipidemia     Past Surgical History:  Procedure Laterality Date   ABDOMINAL HYSTERECTOMY  2006   has ovaries. Had heavy periods and was anemic. HAS cervix   BREAST EXCISIONAL BIOPSY Right 2009   phyllodes removed    CHOLECYSTECTOMY OPEN     COLONOSCOPY WITH PROPOFOL N/A 02/02/2021   Procedure: COLONOSCOPY WITH PROPOFOL;  Surgeon: Toney Reil, MD;  Location: [facility] ENDOSCOPY;  Service: Gastroenterology;  Laterality: N/A;    Home Medications:  No outpatient medications have been marked as taking for the 09/07/21 encounter [facility] Encounter).    Allergies:  Allergies  Allergen Reactions   Pneumovax 23 [Pneumococcal Vac Polyvalent] Swelling    Causes arm swelling, redness.   Prevnar 13 [Pneumococcal 13-Val Conj Vacc] Swelling    Family History  Problem Relation Age of Onset   Congestive Heart Failure Mother    Colon cancer Father    Esophageal cancer Maternal Uncle    Thyroid cancer Neg Hx     Social History:  reports that she has never smoked. She has never  used smokeless tobacco. She reports that she does not currently use alcohol. She reports that she does not use drugs.  ROS: A complete review of systems was performed.  All systems are negative except for pertinent findings as noted.  Physical Exam:  Vital signs in last 24 hours: Temp:  [98 F (36.7 C)] 98 F (36.7 C) (10/10 1315) Pulse Rate:  [83-102] 83 (10/10 1315) Resp:  [19] 19 (10/10 1315) BP: (88-101)/(59-67) 101/67 (10/10 1315) SpO2:  [100 %] 100 % (10/10 1315) Weight:  [92.5 kg] 92.5 kg (10/10 1121) Constitutional:  Alert and oriented, No acute distress HEENT: [place] AT, moist mucus membranes.  Trachea midline, no masses Cardiovascular: Regular rate and rhythm, no clubbing, cyanosis, or edema. Respiratory: Normal respiratory effort, lungs clear bilaterally GI: Abdomen is soft, nontender, nondistended, no abdominal masses GU:+ R CVA tenderness Neurologic: Grossly intact, no focal deficits, moving all 4 extremities Psychiatric: Normal mood and affect   Laboratory Data:  Recent Labs    09/07/21 1319  WBC 14.2*  HGB 11.0*  HCT 32.2*   Recent Labs    09/07/21 1319  NA 132*  K 4.1  CL 96*  CO2 25  GLUCOSE 224*  BUN 25*  CREATININE 1.48*  CALCIUM 8.9   Component     Latest Ref Rng & Units 09/07/2021  Color, Urine     YELLOW YELLOW  Appearance     CLEAR CLOUDY (A)  Specific Gravity, Urine  1.005 - 1.030 >1.030 (H)  pH     5.0 - 8.0 5.0  Glucose, UA     NEGATIVE mg/dL 973 (A)  Hgb urine dipstick     NEGATIVE MODERATE (A)  Bilirubin Urine     NEGATIVE SMALL (A)  Ketones, ur     NEGATIVE mg/dL NEGATIVE  Protein     NEGATIVE mg/dL >532 (A)  Nitrite     NEGATIVE NEGATIVE  Leukocytes,Ua     NEGATIVE MODERATE (A)  Squamous Epithelial / LPF     0 - 5 0-5  WBC, UA     0 - 5 WBC/hpf >50  RBC / HPF     0 - 5 RBC/hpf 6-10  Bacteria, UA     NONE SEEN MANY (A)  WBC Clumps      PRESENT    Radiologic Imaging: Final read for CT stone is still pending  however she has a persistent 3 mm right distal ureteral calculus with proximal mild to moderate hydronephrosis with some perinephric stranding.  Impression/ Plan:  Right ureteral calculus-does meet SIRS criteria based on her vitals in the office earlier although her blood pressure and heart rate have normalized somewhat.  Regardless, she does have a frankly infected urine, leukocytosis and poorly controlled pain.  As such, strongly recommended emergent right ureteral stent placement and admission for IV antibiotics and supportive care.  We will follow-up urine culture and adjust antibiotics as needed.  We will plan for staged ureteroscopy as an outpatient down the road once she is clinically well. UTI/ SIRS- as above Acute kidney injury- secondary to obstruction and infection.  Hydration/avoidance of nephrotoxic agents.  09/07/2021, 3:22 PM  [name],  MD

## 2021-09-07 NOTE — Progress Notes (Signed)
CODE SEPSIS - PHARMACY COMMUNICATION  **Broad Spectrum Antibiotics should be administered within 1 hour of Sepsis diagnosis**  Time Code Sepsis Called/Page Received: 1356  Antibiotics Ordered: ceftriaxone 1g  Time of 1st antibiotic administration: 1616   Doloris Hall, PharmD Pharmacy Resident  09/07/2021 3:57 PM

## 2021-09-07 NOTE — ED Provider Notes (Addendum)
[facility] Emergency Department Provider Note  ____________________________________________   Event Date/Time   First MD Initiated Contact with Patient 09/07/21 1538     (approximate)  I have reviewed the triage vital signs and the nursing notes.   HISTORY  Chief Complaint Flank Pain   HPI Danielle Dickerson is a 65 y.o. female with a past medical history of DM, HDL, depression and known right-sided kidney stone followed by urology CT about 3 weeks ago with some perinephric stranding concerning for infection with patient having completed a course of antibiotics and was feeling little better until 10/8 when she felt the pain came on much stronger in her right lower back rating around the right flank to the right groin as it had initially.  She states she has some nausea but has not had any vomiting, chest pain, cough, shortness of breath, left-sided abdominal pain or back pain, burning with urination, vaginal bleeding, diarrhea or extremity pain.  No recent falls or injuries.  She states she went to the urology clinic today for trouble come to the emergency room as they are planning to place a stent and were concerned she may be septic.  She has not been on antibiotics today or last couple days.  No other acute concerns at this time.         Past Medical History:  Diagnosis Date   COVID-19    05/27/21   Depression    Diabetes mellitus without complication ([place])    Eosinophilic esophagitis    Esophageal stricture 2019   Hyperlipidemia     Patient Active Problem List   Diagnosis Date Noted   Right ureteral stone 09/07/2021   Bilious emesis 08/18/2021   Renal stone 08/18/2021   Pernicious anemia 07/03/2021   Atypical squamous cell changes of undetermined significance (ASCUS) on cervical cytology with negative high risk human papilloma virus (HPV) test result 05/08/2020   HTN (hypertension) 05/06/2020   Hot flashes 05/06/2020   DM (diabetes mellitus) ([place])  01/30/2020   HLD (hyperlipidemia) 01/30/2020   GERD (gastroesophageal reflux disease) 01/30/2020    Past Surgical History:  Procedure Laterality Date   ABDOMINAL HYSTERECTOMY  2006   has ovaries. Had heavy periods and was anemic. HAS cervix   BREAST EXCISIONAL BIOPSY Right 2009   phyllodes removed    CHOLECYSTECTOMY OPEN     COLONOSCOPY WITH PROPOFOL N/A 02/02/2021   Procedure: COLONOSCOPY WITH PROPOFOL;  Surgeon: Lin Landsman, MD;  Location: [facility] ENDOSCOPY;  Service: Gastroenterology;  Laterality: N/A;    Prior to Admission medications   Medication Sig Start Date End Date Taking? Authorizing Provider  Accu-Chek FastClix Lancets MISC Used to check blood sugars twice a day. 03/31/21   Burnard Hawthorne, FNP  aspirin EC 81 MG tablet Take 81 mg by mouth daily.    [provider]  atorvastatin (LIPITOR) 40 MG tablet TAKE 1 TABLET BY MOUTH EVERY DAY 07/02/21   Burnard Hawthorne, FNP  Blood Glucose Monitoring Suppl (ACCU-CHEK GUIDE ME) w/Device KIT USE AS DIRECTED 03/31/21   Burnard Hawthorne, FNP  Calcium-Vitamin D-Vitamin K (VIACTIV CALCIUM PLUS D PO) Take 1 tablet by mouth daily.    [provider]  citalopram (CELEXA) 20 MG tablet TAKE 1 TABLET BY MOUTH EVERY DAY 01/27/21   Burnard Hawthorne, FNP  glipiZIDE (GLUCOTROL XL) 5 MG 24 hr tablet Take 1 tablet (5 mg total) by mouth daily with breakfast. 05/15/21   Burnard Hawthorne, FNP  glucose blood (ACCU-CHEK  AVIVA PLUS) test strip Used to check blood sugars twice a day. 03/31/21   Arnett, Margaret G, FNP  Insulin Pen Needle (PEN NEEDLES) 32G X 4 MM MISC Used to give Victoza injections. 08/14/20   Arnett, Margaret G, FNP  Lancets (ONETOUCH ULTRASOFT) lancets Used to check blood sugars once a day. 01/30/20   Arnett, Margaret G, FNP  lisinopril (ZESTRIL) 5 MG tablet TAKE 1 TABLET BY MOUTH EVERY DAY 07/14/21   Arnett, Margaret G, FNP  metFORMIN (GLUCOPHAGE) 1000 MG tablet TAKE 1 TABLET (1,000 MG TOTAL) BY MOUTH 2 (TWO) TIMES DAILY  WITH A MEAL. 07/01/21   Arnett, Margaret G, FNP  Multiple Vitamins-Minerals (CENTRUM SILVER ADULT 50+ PO) Take 1 tablet by mouth daily.    [provider]  ondansetron (ZOFRAN ODT) 4 MG disintegrating tablet Take 1 tablet (4 mg total) by mouth every 8 (eight) hours as needed. 08/17/21   Veronese, [place], MD  oxyCODONE-acetaminophen (PERCOCET) 5-325 MG tablet Take 1 tablet by mouth every 4 (four) hours as needed. 08/17/21   Veronese, [place], MD  pantoprazole (PROTONIX) 20 MG tablet Take 1 tablet (20 mg total) by mouth 2 (two) times daily before a meal. 05/18/21   Arnett, Margaret G, FNP  Semaglutide,0.25 or 0.5MG/DOS, (OZEMPIC, 0.25 OR 0.5 MG/DOSE,) 2 MG/1.5ML SOPN Inject 0.5 mg into the skin once a week. 08/18/21   Arnett, Margaret G, FNP    Allergies Pneumovax 23 [pneumococcal vac polyvalent] and Prevnar 13 [pneumococcal 13-val conj vacc]  Family History  Problem Relation Age of Onset   Congestive Heart Failure Mother    Colon cancer Father    Esophageal cancer Maternal Uncle    Thyroid cancer Neg Hx     Social History Social History   Tobacco Use   Smoking status: Never   Smokeless tobacco: Never  Vaping Use   Vaping Use: Never used  Substance Use Topics   Alcohol use: Not Currently   Drug use: Never    Review of Systems  Review of Systems  Constitutional:  Negative for chills and fever.  HENT:  Negative for sore throat.   Eyes:  Negative for pain.  Respiratory:  Negative for cough and stridor.   Cardiovascular:  Negative for chest pain.  Gastrointestinal:  Positive for nausea. Negative for vomiting.  Genitourinary:  Positive for flank pain. Negative for dysuria.  Musculoskeletal:  Positive for back pain.  Skin:  Negative for rash.  Neurological:  Negative for seizures, loss of consciousness and headaches.  Psychiatric/Behavioral:  Negative for suicidal ideas.   All other systems reviewed and are negative.     ____________________________________________   PHYSICAL EXAM:  VITAL SIGNS: ED Triage Vitals  Enc Vitals Group     BP 09/07/21 1315 101/67     Pulse Rate 09/07/21 1315 83     Resp 09/07/21 1315 19     Temp 09/07/21 1315 98 F (36.7 C)     Temp src --      SpO2 09/07/21 1315 100 %     Weight --      Height --      Head Circumference --      Peak Flow --      Pain Score 09/07/21 1317 7     Pain Loc --      Pain Edu? --      Excl. in GC? --    Vitals:   09/07/21 1315 09/07/21 1539  BP: 101/67 (!) 148/73  Pulse: 83 89  Resp: 19 18    Temp: 98 F (36.7 C) 99.5 F (37.5 C)  SpO2: 100% 100%   Physical Exam Vitals and nursing note reviewed.  Constitutional:      General: She is not in acute distress.    Appearance: She is well-developed.  HENT:     Head: Normocephalic and atraumatic.     Right Ear: External ear normal.     Left Ear: External ear normal.     Nose: Nose normal.     Mouth/Throat:     Mouth: Mucous membranes are dry.  Eyes:     Conjunctiva/sclera: Conjunctivae normal.  Cardiovascular:     Rate and Rhythm: Normal rate and regular rhythm.     Heart sounds: No murmur heard. Pulmonary:     Effort: Pulmonary effort is normal. No respiratory distress.     Breath sounds: Normal breath sounds.  Abdominal:     Palpations: Abdomen is soft.     Tenderness: There is no abdominal tenderness. There is right CVA tenderness.  Musculoskeletal:     Cervical back: Neck supple.  Skin:    General: Skin is warm and dry.     Capillary Refill: Capillary refill takes 2 to 3 seconds.  Neurological:     Mental Status: She is alert and oriented to person, place, and time.  Psychiatric:        Mood and Affect: Mood normal.     ____________________________________________   LABS (all labs ordered are listed, but only abnormal results are displayed)  Labs Reviewed  BASIC METABOLIC PANEL - Abnormal; Notable for the following components:      Result Value   Sodium  132 (*)    Chloride 96 (*)    Glucose, Bld 224 (*)    BUN 25 (*)    Creatinine, Ser 1.48 (*)    GFR, Estimated 39 (*)    All other components within normal limits  CBC - Abnormal; Notable for the following components:   WBC 14.2 (*)    RBC 3.75 (*)    Hemoglobin 11.0 (*)    HCT 32.2 (*)    Platelets 149 (*)    All other components within normal limits  CULTURE, BLOOD (ROUTINE X 2)  CULTURE, BLOOD (ROUTINE X 2)  RESP PANEL BY RT-PCR (FLU A&B, COVID) ARPGX2  LACTIC ACID, PLASMA  LACTIC ACID, PLASMA  HEMOGLOBIN A1C   ____________________________________________  EKG  ____________________________________________  RADIOLOGY  ED MD interpretation: CT abdomen pelvis shows stable 3 mm right ureteral stone with some mild to moderate right-sided hydronephrosis.  There is resolution of previously noted perinephric stranding on the right.  No other acute abdominal pelvic process.  Official radiology report(s): CT Renal Stone Study  Result Date: 09/07/2021 CLINICAL DATA:  Recurrent right flank pain for 3 weeks. Urinary retention. Nephrolithiasis. EXAM: CT ABDOMEN AND PELVIS WITHOUT CONTRAST TECHNIQUE: Multidetector CT imaging of the abdomen and pelvis was performed following the standard protocol without IV contrast. COMPARISON:  08/17/2021 FINDINGS: Lower chest: No acute findings. Hepatobiliary: No mass visualized on this unenhanced exam. Prior cholecystectomy. No evidence of biliary obstruction. Pancreas: No mass or inflammatory process visualized on this unenhanced exam. Spleen:  Within normal limits in size. Adrenals/Urinary tract: Mild-to-moderate right-sided hydroureteronephrosis is stable, although there has been near complete resolution of right perinephric fluid since prior study. A 3 mm calculus is again seen in the distal right ureter, which is unchanged in size and location since previous study. Stomach/Bowel: No evidence of obstruction, inflammatory process, or abnormal fluid  collections.   Vascular/Lymphatic: No pathologically enlarged lymph nodes identified. No evidence of abdominal aortic aneurysm. Congenital duplication of IVC again demonstrated. Reproductive:  No mass or other significant abnormality. Other:  None. Musculoskeletal:  No suspicious bone lesions identified. IMPRESSION: Stable 3 mm distal right ureteral calculus, unchanged in location since prior CT. Stable mild-to-moderate right hydroureteronephrosis, with near complete resolution of right perinephric fluid since prior study. Electronically Signed   By: Marlaine Hind M.D.   On: 09/07/2021 15:35    ____________________________________________   PROCEDURES  Procedure(s) performed (including Critical Care):  .Critical Care Performed by: Lucrezia Starch, MD Authorized by: Lucrezia Starch, MD   Critical care provider statement:    Critical care time (minutes):  45   Critical care was necessary to treat or prevent imminent or life-threatening deterioration of the following conditions:  Sepsis   Critical care was time spent personally by me on the following activities:  Review of old charts, evaluation of patient's response to treatment, examination of patient, pulse oximetry, ordering and review of radiographic studies, ordering and review of laboratory studies, ordering and performing treatments and interventions, development of treatment plan with patient or surrogate and discussions with consultants   I assumed direction of critical care for this patient from another provider in my specialty: no     Care discussed with: admitting provider     ____________________________________________   INITIAL IMPRESSION / ASSESSMENT AND PLAN / ED COURSE      Patient presents with above-stated history exam for assessment of couple days of worsening right low back pain in the setting of known right-sided kidney stone hydronephrosis having completed a course of antibiotics due to concern for concha mitten  pyelonephritis 3 weeks ago and was diagnosed.  She endorses some nausea but no other clear associated symptoms.  She was seen in urology clinic and was noted to be hypotensive at that time with a systolic of 88 was for to emergency room with concerns for sepsis and plan for emergent right-sided stent placement to relieve obstruction.  UA obtained in clinic remarkable for glucose, greater than 200 protein and moderate leukocyte esterase with greater than 50 WBCs and many bacteria concerning for ongoing infection.  Urine was sent culture was sent from clinic.  BMP remarkable for glucose of 224 and evidence of an AKI with a creatinine of 1.48 compared to 0.71.  There is no evidence of acidosis or DKA or other significant electrolyte or metabolic derangements.  CBC shows WBC count of 14.2 hemoglobin of 11 and normal platelets.  Lactic acid is 1.6.   CT abdomen pelvis shows stable 3 mm right ureteral stone with some mild to moderate right-sided hydronephrosis.  There is resolution of previously noted perinephric stranding on the right.  No other acute abdominal pelvic process.  There is no evidence of diverticulitis, appendicitis, cholecystitis, pancreatitis or other acute abdominal pelvic process to explain patient's discomfort.  Suspect likely ongoing infected stone in the right is likely the source of patient's discomfort low blood pressure leukocytosis nausea and AKI.  I am concerned for sepsis.  Blood cultures were ordered and patient given Rocephin and a dose of Zofran for her nausea and some IV fluids.  She will be taken to the OR for emergent stent tightness.  Discussed with on-call urologist Dr. Hollice Espy who will admit to her service following procedure.     ____________________________________________   FINAL CLINICAL IMPRESSION(S) / ED DIAGNOSES  Final diagnoses:  Sepsis, due to unspecified organism, unspecified whether acute  organ dysfunction present ([place])  Kidney stone   Hyperglycemia  AKI (acute kidney injury) ([place])    Medications  insulin aspart (novoLOG) injection 0-15 Units (has no administration in time range)  lactated ringers bolus 1,000 mL (has no administration in time range)  cefTRIAXone (ROCEPHIN) 1 g in sodium chloride 0.9 % 100 mL IVPB (has no administration in time range)  ondansetron (ZOFRAN) injection 4 mg (has no administration in time range)  morphine 4 MG/ML injection 4 mg (has no administration in time range)  lactated ringers infusion (has no administration in time range)  0.9 %  sodium chloride infusion ( Intravenous New Bag/Given 09/07/21 1324)     ED Discharge Orders     None        Note:  This document was prepared using Dragon voice recognition software and may include unintentional dictation errors.    Lucrezia Starch, MD 09/07/21 1555    Lucrezia Starch, MD 09/07/21 1556

## 2021-09-07 NOTE — Anesthesia Procedure Notes (Signed)
Procedure Name: Intubation Date/Time: 09/07/2021 4:26 PM Performed by: Katherine Basset, CRNA Pre-anesthesia Checklist: Patient identified, Emergency Drugs available, Suction available and Patient being monitored Patient Re-evaluated:Patient Re-evaluated prior to induction Oxygen Delivery Method: Circle system utilized Preoxygenation: Pre-oxygenation with 100% oxygen Induction Type: IV induction, Rapid sequence and Cricoid Pressure applied Ventilation: Mask ventilation without difficulty Laryngoscope Size: Miller and 2 Grade View: Grade I Tube type: Oral Tube size: 7.5 mm Number of attempts: 1 Airway Equipment and Method: Stylet, Oral airway and Bite block Placement Confirmation: ETT inserted through vocal cords under direct vision, positive ETCO2 and breath sounds checked- equal and bilateral Secured at: 23 cm Tube secured with: Tape Dental Injury: Teeth and Oropharynx as per pre-operative assessment

## 2021-09-07 NOTE — ED Notes (Signed)
Pt changed into hospital for pre surgery. VS reassess.

## 2021-09-07 NOTE — Sepsis Progress Note (Signed)
Sepsis protocol is being monitored by eLink. Patient has been taken to OR for surgery.

## 2021-09-07 NOTE — Anesthesia Preprocedure Evaluation (Signed)
Anesthesia Evaluation  Patient identified by MRN, date of birth, ID band Patient awake    Reviewed: Allergy & Precautions, H&P , NPO status , Patient's Chart, lab work & pertinent test results, reviewed documented beta blocker date and time   Airway Mallampati: II  TM Distance: >3 FB Neck ROM: full    Dental  (+) Teeth Intact   Pulmonary neg pulmonary ROS,    Pulmonary exam normal        Cardiovascular Exercise Tolerance: Poor hypertension, On Medications Normal cardiovascular exam Rhythm:regular Rate:Normal     Neuro/Psych PSYCHIATRIC DISORDERS Depression negative neurological ROS     GI/Hepatic Neg liver ROS, GERD  Medicated,  Endo/Other  negative endocrine ROSdiabetes  Renal/GU Renal disease  negative genitourinary   Musculoskeletal   Abdominal   Peds  Hematology  (+) Blood dyscrasia, anemia ,   Anesthesia Other Findings Past Medical History: No date: COVID-19     Comment:  05/27/21 No date: Depression No date: Diabetes mellitus without complication (HCC) No date: Eosinophilic esophagitis 2019: Esophageal stricture No date: Hyperlipidemia Past Surgical History: 2006: ABDOMINAL HYSTERECTOMY     Comment:  has ovaries. Had heavy periods and was anemic. HAS               cervix 2009: BREAST EXCISIONAL BIOPSY; Right     Comment:  phyllodes removed  No date: CHOLECYSTECTOMY OPEN 02/02/2021: COLONOSCOPY WITH PROPOFOL; N/A     Comment:  Procedure: COLONOSCOPY WITH PROPOFOL;  Surgeon: Toney Reil, MD;  Location: [facility] ENDOSCOPY;  Service:               Gastroenterology;  Laterality: N/A;   Reproductive/Obstetrics negative OB ROS                             Anesthesia Physical Anesthesia Plan  ASA: 3 and emergent  Anesthesia Plan: General ETT   Post-op Pain Management:    Induction:   PONV Risk Score and Plan:   Airway Management Planned:   Additional  Equipment:   Intra-op Plan:   Post-operative Plan:   Informed Consent: I have reviewed the patients History and Physical, chart, labs and discussed the procedure including the risks, benefits and alternatives for the proposed anesthesia with the patient or authorized representative who has indicated his/her understanding and acceptance.     Dental Advisory Given  Plan Discussed with: CRNA  Anesthesia Plan Comments:         Anesthesia Quick Evaluation

## 2021-09-07 NOTE — ED Triage Notes (Signed)
Pt come with c/o possible infected kidney stone. Pt sent over by PCP. Pt state she was seen here and evaluated and felt better. Pt states then on SAturday the pain got intense.  Pt states he now feels tired and just so sick.  Pt denies any fevers at home.  Pt states lots of vomiting.

## 2021-09-07 NOTE — H&P (View-Only) (Signed)
Chief Complaint: right flank pain  History of Present Illness: Danielle Dickerson is a 65 y.o. year old female with a 3 mm right distal ureteral calculus that was thought to possibly have already passed it who presented to the clinic earlier today with concerns for sepsis including hypotension (88/59), tachycardia (102), and severe flank pain.  Her urine was grossly infected and a culture was sent.  She was referred to the emergency room for further labs and management.  Please see [name]'s note from earlier today for further details.    In the emergency room, she was found to have a leukocytosis, acute kidney injury as well as a persistent right 3 mm distal ureteral calculus with proximal hydroureteronephrosis.   Past Medical History:  Diagnosis Date   COVID-19    05/27/21   Depression    Diabetes mellitus without complication (HCC)    Eosinophilic esophagitis    Esophageal stricture 2019   Hyperlipidemia     Past Surgical History:  Procedure Laterality Date   ABDOMINAL HYSTERECTOMY  2006   has ovaries. Had heavy periods and was anemic. HAS cervix   BREAST EXCISIONAL BIOPSY Right 2009   phyllodes removed    CHOLECYSTECTOMY OPEN     COLONOSCOPY WITH PROPOFOL N/A 02/02/2021   Procedure: COLONOSCOPY WITH PROPOFOL;  Surgeon: Toney Reil, MD;  Location: [facility] ENDOSCOPY;  Service: Gastroenterology;  Laterality: N/A;    Home Medications:  No outpatient medications have been marked as taking for the 09/07/21 encounter [facility] Encounter).    Allergies:  Allergies  Allergen Reactions   Pneumovax 23 [Pneumococcal Vac Polyvalent] Swelling    Causes arm swelling, redness.   Prevnar 13 [Pneumococcal 13-Val Conj Vacc] Swelling    Family History  Problem Relation Age of Onset   Congestive Heart Failure Mother    Colon cancer Father    Esophageal cancer Maternal Uncle    Thyroid cancer Neg Hx     Social History:  reports that she has never smoked. She has never  used smokeless tobacco. She reports that she does not currently use alcohol. She reports that she does not use drugs.  ROS: A complete review of systems was performed.  All systems are negative except for pertinent findings as noted.  Physical Exam:  Vital signs in last 24 hours: Temp:  [98 F (36.7 C)] 98 F (36.7 C) (10/10 1315) Pulse Rate:  [83-102] 83 (10/10 1315) Resp:  [19] 19 (10/10 1315) BP: (88-101)/(59-67) 101/67 (10/10 1315) SpO2:  [100 %] 100 % (10/10 1315) Weight:  [92.5 kg] 92.5 kg (10/10 1121) Constitutional:  Alert and oriented, No acute distress HEENT: [place] AT, moist mucus membranes.  Trachea midline, no masses Cardiovascular: Regular rate and rhythm, no clubbing, cyanosis, or edema. Respiratory: Normal respiratory effort, lungs clear bilaterally GI: Abdomen is soft, nontender, nondistended, no abdominal masses GU:+ R CVA tenderness Neurologic: Grossly intact, no focal deficits, moving all 4 extremities Psychiatric: Normal mood and affect   Laboratory Data:  Recent Labs    09/07/21 1319  WBC 14.2*  HGB 11.0*  HCT 32.2*   Recent Labs    09/07/21 1319  NA 132*  K 4.1  CL 96*  CO2 25  GLUCOSE 224*  BUN 25*  CREATININE 1.48*  CALCIUM 8.9   Component     Latest Ref Rng & Units 09/07/2021  Color, Urine     YELLOW YELLOW  Appearance     CLEAR CLOUDY (A)  Specific Gravity, Urine  1.005 - 1.030 >1.030 (H)  pH     5.0 - 8.0 5.0  Glucose, UA     NEGATIVE mg/dL 973 (A)  Hgb urine dipstick     NEGATIVE MODERATE (A)  Bilirubin Urine     NEGATIVE SMALL (A)  Ketones, ur     NEGATIVE mg/dL NEGATIVE  Protein     NEGATIVE mg/dL >532 (A)  Nitrite     NEGATIVE NEGATIVE  Leukocytes,Ua     NEGATIVE MODERATE (A)  Squamous Epithelial / LPF     0 - 5 0-5  WBC, UA     0 - 5 WBC/hpf >50  RBC / HPF     0 - 5 RBC/hpf 6-10  Bacteria, UA     NONE SEEN MANY (A)  WBC Clumps      PRESENT    Radiologic Imaging: Final read for CT stone is still pending  however she has a persistent 3 mm right distal ureteral calculus with proximal mild to moderate hydronephrosis with some perinephric stranding.  Impression/ Plan:  Right ureteral calculus-does meet SIRS criteria based on her vitals in the office earlier although her blood pressure and heart rate have normalized somewhat.  Regardless, she does have a frankly infected urine, leukocytosis and poorly controlled pain.  As such, strongly recommended emergent right ureteral stent placement and admission for IV antibiotics and supportive care.  We will follow-up urine culture and adjust antibiotics as needed.  We will plan for staged ureteroscopy as an outpatient down the road once she is clinically well. UTI/ SIRS- as above Acute kidney injury- secondary to obstruction and infection.  Hydration/avoidance of nephrotoxic agents.  09/07/2021, 3:22 PM  [name],  MD

## 2021-09-07 NOTE — Transfer of Care (Signed)
Immediate Anesthesia Transfer of Care Note  Patient: Danielle Dickerson  Procedure(s) Performed: CYSTOSCOPY WITH STENT PLACEMENT; WITH RETROGRADE PYELOGRAM (Right: Ureter)  Patient Location: PACU  Anesthesia Type:General  Level of Consciousness: drowsy  Airway & Oxygen Therapy: Patient Spontanous Breathing and Patient connected to face mask oxygen  Post-op Assessment: Report given to RN, Post -op Vital signs reviewed and stable and Patient moving all extremities  Post vital signs: Reviewed and stable  Last Vitals:  Vitals Value Taken Time  BP 96/85 09/07/21 1647  Temp    Pulse 94 09/07/21 1647  Resp 25 09/07/21 1647  SpO2 99 % 09/07/21 1647  Vitals shown include unvalidated device data.  Last Pain:  Vitals:   09/07/21 1608  TempSrc: Temporal  PainSc: 6          Complications: No notable events documented.

## 2021-09-07 NOTE — Op Note (Signed)
Date of procedure: 09/07/21  Preoperative diagnosis:  Right obstructing ureteral calculus Sepsis of probable urinary source  Postoperative diagnosis:  Same as above  Procedure: Cystoscopy Right retrograde pyelogram Right ureteral stent placement  Surgeon: [name], MD  Anesthesia: General  Complications: None  Intraoperative findings: Persistent 3 mm distal obstructing stone meeting SIRS criteria.  Persistent hydronephrosis with filling defect in right distal ureter consistent with stone.  Stent placed without difficulty.  EBL: Minimal  Specimens: None  Drains: 6 x 24 French double-J ureteral stent on right  Indication: Danielle Dickerson is a 65 y.o. patient with septic obstructing stone.  After reviewing the management options for treatment, she elected to proceed with the above surgical procedure(s). We have discussed the potential benefits and risks of the procedure, side effects of the proposed treatment, the likelihood of the patient achieving the goals of the procedure, and any potential problems that might occur during the procedure or recuperation. Informed consent has been obtained.  Description of procedure:  The patient was taken to the operating room and general anesthesia was induced.  The patient was placed in the dorsal lithotomy position, prepped and draped in the usual sterile fashion, and preoperative antibiotics were administered. A preoperative time-out was performed.   A 21 French the scope was advanced per urethra into the bladder.  Attention was turned to the right ureteral orifice which was cannulated using a 5 [place] open-ended ureteral catheter.  Of note, there is some mild erythema diffusely across the bladder consistent with cystitis.  A gentle retrograde pyelogram was performed with 4 cc which showed a small filling defect in the right distal ureter and some mild hydronephrosis proximally.  A wire was then placed up to the level of the kidney.  A 6 x  24 French double-J ureteral stent was advanced over the wire.  Upon withdrawing the wire, there is a full coil both within the bladder and the kidney.  There is no notable E flux upon stent placement.  The bladder was drained.  The patient was cleaned and dried, repositioned in supine position, reversed of anesthesia and taken the PACU in stable condition.  Plan: She will be admitted to our service for supportive care, IV antibiotics.  [name], M.D.

## 2021-09-08 ENCOUNTER — Encounter: Payer: Self-pay | Admitting: Urology

## 2021-09-08 ENCOUNTER — Other Ambulatory Visit: Payer: Self-pay | Admitting: Urology

## 2021-09-08 DIAGNOSIS — N179 Acute kidney failure, unspecified: Secondary | ICD-10-CM | POA: Diagnosis present

## 2021-09-08 DIAGNOSIS — Z79899 Other long term (current) drug therapy: Secondary | ICD-10-CM | POA: Diagnosis not present

## 2021-09-08 DIAGNOSIS — Z7984 Long term (current) use of oral hypoglycemic drugs: Secondary | ICD-10-CM | POA: Diagnosis not present

## 2021-09-08 DIAGNOSIS — N134 Hydroureter: Secondary | ICD-10-CM | POA: Diagnosis not present

## 2021-09-08 DIAGNOSIS — Z7982 Long term (current) use of aspirin: Secondary | ICD-10-CM | POA: Diagnosis not present

## 2021-09-08 DIAGNOSIS — N136 Pyonephrosis: Secondary | ICD-10-CM | POA: Diagnosis present

## 2021-09-08 DIAGNOSIS — N132 Hydronephrosis with renal and ureteral calculous obstruction: Secondary | ICD-10-CM | POA: Diagnosis not present

## 2021-09-08 DIAGNOSIS — I10 Essential (primary) hypertension: Secondary | ICD-10-CM | POA: Diagnosis present

## 2021-09-08 DIAGNOSIS — Z800 Family history of malignant neoplasm of digestive organs: Secondary | ICD-10-CM | POA: Diagnosis not present

## 2021-09-08 DIAGNOSIS — E785 Hyperlipidemia, unspecified: Secondary | ICD-10-CM | POA: Diagnosis present

## 2021-09-08 DIAGNOSIS — Z8616 Personal history of COVID-19: Secondary | ICD-10-CM | POA: Diagnosis not present

## 2021-09-08 DIAGNOSIS — Z794 Long term (current) use of insulin: Secondary | ICD-10-CM | POA: Diagnosis not present

## 2021-09-08 DIAGNOSIS — N200 Calculus of kidney: Secondary | ICD-10-CM | POA: Diagnosis present

## 2021-09-08 DIAGNOSIS — N201 Calculus of ureter: Secondary | ICD-10-CM

## 2021-09-08 DIAGNOSIS — E1165 Type 2 diabetes mellitus with hyperglycemia: Secondary | ICD-10-CM | POA: Diagnosis present

## 2021-09-08 DIAGNOSIS — A419 Sepsis, unspecified organism: Secondary | ICD-10-CM | POA: Diagnosis not present

## 2021-09-08 DIAGNOSIS — K219 Gastro-esophageal reflux disease without esophagitis: Secondary | ICD-10-CM | POA: Diagnosis present

## 2021-09-08 DIAGNOSIS — Z90710 Acquired absence of both cervix and uterus: Secondary | ICD-10-CM | POA: Diagnosis not present

## 2021-09-08 DIAGNOSIS — D7289 Other specified disorders of white blood cells: Secondary | ICD-10-CM | POA: Diagnosis not present

## 2021-09-08 DIAGNOSIS — A4151 Sepsis due to Escherichia coli [E. coli]: Secondary | ICD-10-CM | POA: Diagnosis present

## 2021-09-08 LAB — BLOOD CULTURE ID PANEL (REFLEXED) - BCID2

## 2021-09-08 LAB — GLUCOSE, CAPILLARY
Glucose-Capillary: 204 mg/dL — ABNORMAL HIGH (ref 70–99)
Glucose-Capillary: 112 mg/dL — ABNORMAL HIGH (ref 70–99)
Glucose-Capillary: 157 mg/dL — ABNORMAL HIGH (ref 70–99)

## 2021-09-08 LAB — BASIC METABOLIC PANEL
Anion gap: 6 (ref 5–15)
BUN: 21 mg/dL (ref 8–23)
CO2: 23 mmol/L (ref 22–32)
Calcium: 8.4 mg/dL — ABNORMAL LOW (ref 8.9–10.3)
Chloride: 107 mmol/L (ref 98–111)
Creatinine, Ser: 1.02 mg/dL — ABNORMAL HIGH (ref 0.44–1.00)
GFR, Estimated: >60 mL/min (ref >60)
Glucose, Bld: 249 mg/dL — ABNORMAL HIGH (ref 70–99)
Potassium: 4 mmol/L (ref 3.5–5.1)
Sodium: 136 mmol/L (ref 135–145)

## 2021-09-08 LAB — CBC
HCT: 29.6 % — ABNORMAL LOW (ref 36.0–46.0)
Hemoglobin: 9.7 g/dL — ABNORMAL LOW (ref 12.0–15.0)
MCH: 28.1 pg (ref 26.0–34.0)
MCHC: 32.8 g/dL (ref 30.0–36.0)
MCV: 85.8 fL (ref 80.0–100.0)
Platelets: 118 10*3/uL — ABNORMAL LOW (ref 150–400)
RBC: 3.45 MIL/uL — ABNORMAL LOW (ref 3.87–5.11)
RDW: 13.2 % (ref 11.5–15.5)
WBC: 10.3 10*3/uL (ref 4.0–10.5)
nRBC: 0 % (ref 0.0–0.2)

## 2021-09-08 LAB — HEMOGLOBIN A1C
Hgb A1c MFr Bld: 6.2 % — ABNORMAL HIGH (ref 4.8–5.6)
Mean Plasma Glucose: 131 mg/dL

## 2021-09-08 MED ORDER — SODIUM CHLORIDE 0.9 % IV SOLN
2.0000 g | INTRAVENOUS | Status: DC
  Administered 2021-09-08 – 2021-09-10 (×3): 2 g via INTRAVENOUS
  Filled 2021-09-08: qty 20
  Filled 2021-09-08: qty 2
  Filled 2021-09-08: qty 20

## 2021-09-08 MED ORDER — PHENOL 1.4 % MT LIQD
1.0000 | OROMUCOSAL | Status: DC | PRN
  Filled 2021-09-08 (×2): qty 177

## 2021-09-08 MED ORDER — TAMSULOSIN HCL 0.4 MG PO CAPS
0.4000 mg | ORAL_CAPSULE | Freq: Every day | ORAL | Status: DC
  Administered 2021-09-08 – 2021-09-10 (×2): 0.4 mg via ORAL
  Filled 2021-09-08 (×3): qty 1

## 2021-09-08 MED ORDER — KETOROLAC TROMETHAMINE 15 MG/ML IJ SOLN: 15.0000 mg | Freq: Four times a day (QID) | INTRAMUSCULAR | Status: DC | PRN

## 2021-09-08 NOTE — Progress Notes (Signed)
PHARMACY - PHYSICIAN COMMUNICATION CRITICAL VALUE ALERT - BLOOD CULTURE IDENTIFICATION (BCID)  Danielle Dickerson is an 65 y.o. female who presented to [facility] on 09/07/2021 with a chief complaint of right uretral stone  Assessment:  E coli in 1 of 4 bottles , no resistance detected.  (include suspected source if known)  Name of physician (or Provider) Contacted:  Dr Apolinar Junes  Current antibiotics: Ceftriaxone 1 gm IV Q24H  Changes to prescribed antibiotics recommended:  Increase to ceftriaxone 2 gm IV Q24H  Results for orders placed or performed during the hospital encounter of 09/07/21  Blood Culture ID Panel (Reflexed) (Collected: 09/07/2021  1:19 PM)  Result Value Ref Range   Enterococcus faecalis NOT DETECTED NOT DETECTED   Enterococcus Faecium NOT DETECTED NOT DETECTED   Listeria monocytogenes NOT DETECTED NOT DETECTED   Staphylococcus species NOT DETECTED NOT DETECTED   Staphylococcus aureus (BCID) NOT DETECTED NOT DETECTED   Staphylococcus epidermidis NOT DETECTED NOT DETECTED   Staphylococcus lugdunensis NOT DETECTED NOT DETECTED   Streptococcus species NOT DETECTED NOT DETECTED   Streptococcus agalactiae NOT DETECTED NOT DETECTED   Streptococcus pneumoniae NOT DETECTED NOT DETECTED   Streptococcus pyogenes NOT DETECTED NOT DETECTED   A.calcoaceticus-baumannii NOT DETECTED NOT DETECTED   Bacteroides fragilis NOT DETECTED NOT DETECTED   Enterobacterales DETECTED (A) NOT DETECTED   Enterobacter cloacae complex NOT DETECTED NOT DETECTED   Escherichia coli DETECTED (A) NOT DETECTED   Klebsiella aerogenes NOT DETECTED NOT DETECTED   Klebsiella oxytoca NOT DETECTED NOT DETECTED   Klebsiella pneumoniae NOT DETECTED NOT DETECTED   Proteus species NOT DETECTED NOT DETECTED   Salmonella species NOT DETECTED NOT DETECTED   Serratia marcescens NOT DETECTED NOT DETECTED   Haemophilus influenzae NOT DETECTED NOT DETECTED   Neisseria meningitidis NOT DETECTED NOT DETECTED    Pseudomonas aeruginosa NOT DETECTED NOT DETECTED   Stenotrophomonas maltophilia NOT DETECTED NOT DETECTED   Candida albicans NOT DETECTED NOT DETECTED   Candida auris NOT DETECTED NOT DETECTED   Candida glabrata NOT DETECTED NOT DETECTED   Candida krusei NOT DETECTED NOT DETECTED   Candida parapsilosis NOT DETECTED NOT DETECTED   Candida tropicalis NOT DETECTED NOT DETECTED   Cryptococcus neoformans/gattii NOT DETECTED NOT DETECTED   CTX-M ESBL NOT DETECTED NOT DETECTED   Carbapenem resistance IMP NOT DETECTED NOT DETECTED   Carbapenem resistance KPC NOT DETECTED NOT DETECTED   Carbapenem resistance NDM NOT DETECTED NOT DETECTED   Carbapenem resist OXA 48 LIKE NOT DETECTED NOT DETECTED   Carbapenem resistance VIM NOT DETECTED NOT DETECTED    Madasyn Heath D 09/08/2021  4:06 AM

## 2021-09-08 NOTE — Progress Notes (Signed)
Urology Inpatient Progress Note  Subjective: No acute events overnight.  She is afebrile, VSS. Creatinine down today, 1.02.  WBC count down today, 10.3.  Urine culture pending, blood cultures positive for E. coli.  On antibiotics as below. Patient reports some urinary frequency and foreign body sensation with urination but denies overt pain.  She had some gross hematuria yesterday that has since resolved.  She is voiding spontaneously.  Anti-infectives: Anti-infectives (From admission, onward)    Start     Dose/Rate Route Frequency Ordered Stop   09/08/21 1600  cefTRIAXone (ROCEPHIN) 1 g in sodium chloride 0.9 % 100 mL IVPB  Status:  Discontinued        1 g 200 mL/hr over 30 Minutes Intravenous Every 24 hours 09/07/21 1736 09/08/21 0405   09/08/21 0500  cefTRIAXone (ROCEPHIN) 2 g in sodium chloride 0.9 % 100 mL IVPB        2 g 200 mL/hr over 30 Minutes Intravenous Every 24 hours 09/08/21 0405     09/07/21 1600  cefTRIAXone (ROCEPHIN) 1 g in sodium chloride 0.9 % 100 mL IVPB        1 g 200 mL/hr over 30 Minutes Intravenous  Once 09/07/21 1546 09/07/21 1730       Current Facility-Administered Medications  Medication Dose Route Frequency Provider Last Rate Last Admin   0.9 %  sodium chloride infusion   Intravenous Continuous [name], MD 125 mL/hr at 09/08/21 0559 Infusion Verify at 09/08/21 0559   0.9 %  sodium chloride infusion   Intravenous Continuous Yevette Edwards, MD 10 mL/hr at 09/07/21 1616 Restarted at 09/07/21 1640   acetaminophen (TYLENOL) tablet 650 mg  650 mg Oral Q4H PRN [name], MD       belladonna-opium (B&O) suppository 16.2-60mg   1 suppository Rectal Q6H PRN [name], MD       cefTRIAXone (ROCEPHIN) 2 g in sodium chloride 0.9 % 100 mL IVPB  2 g Intravenous Q24H [name], MD   Stopped at 09/08/21 0558   diphenhydrAMINE (BENADRYL) injection 12.5 mg  12.5 mg Intravenous Q6H PRN [name], MD       Or   diphenhydrAMINE (BENADRYL) 12.5  MG/5ML elixir 12.5 mg  12.5 mg Oral Q6H PRN [name], MD       docusate sodium (COLACE) capsule 100 mg  100 mg Oral BID [name], MD   100 mg at 09/07/21 2029   heparin injection 5,000 Units  5,000 Units Subcutaneous Louellen Molder, MD   5,000 Units at 09/08/21 0511   insulin aspart (novoLOG) injection 0-15 Units  0-15 Units Subcutaneous TID WC [name], MD       insulin aspart (novoLOG) injection 0-5 Units  0-5 Units Subcutaneous QHS [name], MD   3 Units at 09/07/21 2035   insulin aspart (novoLOG) injection 4 Units  4 Units Subcutaneous TID WC [name], MD       lactated ringers infusion   Intravenous Continuous Gilles Chiquito, MD       morphine 2 MG/ML injection 2-4 mg  2-4 mg Intravenous Q2H PRN [name], MD       morphine 4 MG/ML injection 4 mg  4 mg Intravenous Once Gilles Chiquito, MD       ondansetron [facility]) injection 4 mg  4 mg Intravenous Once Gilles Chiquito, MD       ondansetron [facility]) injection 4 mg  4 mg Intravenous Q4H PRN [name], MD  oxybutynin (DITROPAN) tablet 5 mg  5 mg Oral Q8H PRN [name], MD       oxyCODONE-acetaminophen (PERCOCET/ROXICET) 5-325 MG per tablet 1-2 tablet  1-2 tablet Oral Q4H PRN [name], MD       phenol (CHLORASEPTIC) mouth spray 1 spray  1 spray Mouth/Throat PRN [name], MD       Objective: Vital signs in last 24 hours: Temp:  [97.4 F (36.3 C)-99.5 F (37.5 C)] 98 F (36.7 C) (10/11 0803) Pulse Rate:  [75-107] 83 (10/11 0803) Resp:  [13-22] 18 (10/11 0803) BP: (87-151)/(51-85) 138/80 (10/11 0803) SpO2:  [93 %-100 %] 98 % (10/11 0803) Weight:  [92.5 kg] 92.5 kg (10/10 1608)  Intake/Output from previous day: 10/10 0701 - 10/11 0700 In: 4986.9 [P.O.:930; I.V.:1756.9; IV Piggyback:2300] Out: 0  Intake/Output this shift: No intake/output data recorded.  Physical Exam Vitals and nursing note reviewed.  Constitutional:      General: She is not in acute  distress.    Appearance: She is not ill-appearing, toxic-appearing or diaphoretic.  HENT:     Head: Normocephalic and atraumatic.  Pulmonary:     Effort: Pulmonary effort is normal. No respiratory distress.  Skin:    General: Skin is warm and dry.  Neurological:     Mental Status: She is alert and oriented to person, place, and time.  Psychiatric:        Mood and Affect: Mood normal.        Behavior: Behavior normal.   Lab Results:  Recent Labs    09/07/21 1319 09/08/21 0423  WBC 14.2* 10.3  HGB 11.0* 9.7*  HCT 32.2* 29.6*  PLT 149* 118*   BMET Recent Labs    09/07/21 1319 09/08/21 0423  NA 132* 136  K 4.1 4.0  CL 96* 107  CO2 25 23  GLUCOSE 224* 249*  BUN 25* 21  CREATININE 1.48* 1.02*  CALCIUM 8.9 8.4*   Assessment & Plan: 65 year old female POD 1 from urgent right ureteral stent placement for management of an obstructing right ureteral calculus with urosepsis.  Patient clinically improving today.  Blood cultures preliminary positive for E. coli, on empiric antibiotics.  Mild stent symptoms, I am starting her on Flomax today.  Oxybutynin as needed has already been ordered.  Given positive blood cultures, will plan to keep her an additional day for continued IV antibiotics.  We discussed that she will require a total of 14 days of antibiotic therapy and follow-up outpatient ureteroscopy with laser lithotripsy and stent exchange in 3 to 4 weeks.  Patient expressed understanding.  Carman Ching, PA-C 09/08/2021

## 2021-09-08 NOTE — Progress Notes (Signed)
   09/08/21 1556  Assess: MEWS Score  Temp (!) 102.9 F (39.4 C) (Tylenol given)  BP 126/66  Pulse Rate 63  Resp 19  SpO2 93 %  O2 Device Room Air  Assess: MEWS Score  MEWS Temp 2  MEWS Systolic 0  MEWS Pulse 0  MEWS RR 0  MEWS LOC 0  MEWS Score 2  MEWS Score Color Yellow  Assess: if the MEWS score is Yellow or Red  Were vital signs taken at a resting state? Yes  Focused Assessment No change from prior assessment  Does the patient meet 2 or more of the SIRS criteria? No  MEWS guidelines implemented *See Row Information* Yes  Take Vital Signs  Increase Vital Sign Frequency  Yellow: Q 2hr X 2 then Q 4hr X 2, if remains yellow, continue Q 4hrs  Notify: Provider  Provider Name/Title Apolinar Junes  Date Provider Notified 09/08/21  Time Provider Notified 1600  Notification Type Page  Notification Reason Change in status (high temp)  Provider response No new orders  Date of Provider Response 09/08/21  Time of Provider Response 1614  Document  Patient Outcome Other (Comment) (will re-check after tylenol)  Progress note created (see row info) Yes  Assess: SIRS CRITERIA  SIRS Temperature  1  SIRS Pulse 0  SIRS Respirations  0  SIRS WBC 0  SIRS Score Sum  1

## 2021-09-08 NOTE — Progress Notes (Signed)
Surgical Physician Order Form  * Scheduling expectation :  2 weeks  *Length of Case:   *Clearance needed: no  *Anticoagulation Instructions: Hold all anticoagulants  *Aspirin Instructions: Ok to continue Aspirin  *Post-op visit Date/Instructions:  1 month with RUS prior  *Diagnosis: Right Ureteral Stone  *Procedure: Ureteroscopy w/laser lithotripsy & stent placement/exchange (86381)  RIGHT  -Admit type: OUTpatient  -Anesthesia: General  -VTE Prophylaxis Standing Order SCD's       Other:   -Standing Lab Orders Per Anesthesia    Lab other: None  -Standing Test orders EKG/Chest x-ray per Anesthesia       Test other:   - Medications:     Ancef 1gm IV   Other Instructions:  Currently inpatient, will be discharged in 1-2 days

## 2021-09-09 DIAGNOSIS — N134 Hydroureter: Secondary | ICD-10-CM | POA: Diagnosis not present

## 2021-09-09 DIAGNOSIS — N132 Hydronephrosis with renal and ureteral calculous obstruction: Secondary | ICD-10-CM | POA: Diagnosis not present

## 2021-09-09 DIAGNOSIS — D7289 Other specified disorders of white blood cells: Secondary | ICD-10-CM | POA: Diagnosis not present

## 2021-09-09 DIAGNOSIS — N179 Acute kidney failure, unspecified: Secondary | ICD-10-CM | POA: Diagnosis not present

## 2021-09-09 LAB — URINE CULTURE: Culture: >=100000 — AB

## 2021-09-09 LAB — GLUCOSE, CAPILLARY
Glucose-Capillary: 161 mg/dL — ABNORMAL HIGH (ref 70–99)
Glucose-Capillary: 123 mg/dL — ABNORMAL HIGH (ref 70–99)
Glucose-Capillary: 156 mg/dL — ABNORMAL HIGH (ref 70–99)
Glucose-Capillary: 101 mg/dL — ABNORMAL HIGH (ref 70–99)
Glucose-Capillary: 126 mg/dL — ABNORMAL HIGH (ref 70–99)

## 2021-09-09 NOTE — Progress Notes (Addendum)
Urology Consult Follow Up  Subjective: She continues to have issues with fever.  She spiked a fever of 102.9 yesterday afternoon around 3 PM and again at 101.6 at 2 in the morning.  This morning her vital signs are stable with a low-grade fever.  WBC count was 10.3 yesterday.  Serum creatinine 1.02 yesterday.  Urine culture is positive for pansensitive E. coli.  And blood cultures are positive for E. coli.  She is experiencing epigastric pain, but she denies any shortness of breath, cough, nausea or excessive belching.  She is tolerating solid foods.  Anti-infectives: Anti-infectives (From admission, onward)    Start     Dose/Rate Route Frequency Ordered Stop   09/08/21 1600  cefTRIAXone (ROCEPHIN) 1 g in sodium chloride 0.9 % 100 mL IVPB  Status:  Discontinued        1 g 200 mL/hr over 30 Minutes Intravenous Every 24 hours 09/07/21 1736 09/08/21 0405   09/08/21 0500  cefTRIAXone (ROCEPHIN) 2 g in sodium chloride 0.9 % 100 mL IVPB        2 g 200 mL/hr over 30 Minutes Intravenous Every 24 hours 09/08/21 0405     09/07/21 1600  cefTRIAXone (ROCEPHIN) 1 g in sodium chloride 0.9 % 100 mL IVPB        1 g 200 mL/hr over 30 Minutes Intravenous  Once 09/07/21 1546 09/07/21 1730       Current Facility-Administered Medications  Medication Dose Route Frequency Provider Last Rate Last Admin   0.9 %  sodium chloride infusion   Intravenous Continuous [name], MD 125 mL/hr at 09/09/21 0230 New Bag at 09/09/21 0230   0.9 %  sodium chloride infusion   Intravenous Continuous Yevette Edwards, MD 10 mL/hr at 09/07/21 1616 Restarted at 09/07/21 1640   acetaminophen (TYLENOL) tablet 650 mg  650 mg Oral Q4H PRN [name], MD   650 mg at 09/09/21 5456   belladonna-opium (B&O) suppository 16.2-60mg   1 suppository Rectal Q6H PRN [name], MD       cefTRIAXone (ROCEPHIN) 2 g in sodium chloride 0.9 % 100 mL IVPB  2 g Intravenous Q24H [name], MD 200 mL/hr at 09/09/21 0456 2 g at  09/09/21 0456   diphenhydrAMINE (BENADRYL) injection 12.5 mg  12.5 mg Intravenous Q6H PRN [name], MD       Or   diphenhydrAMINE (BENADRYL) 12.5 MG/5ML elixir 12.5 mg  12.5 mg Oral Q6H PRN [name], MD       docusate sodium (COLACE) capsule 100 mg  100 mg Oral BID [name], MD   100 mg at 09/08/21 2031   heparin injection 5,000 Units  5,000 Units Subcutaneous Louellen Molder, MD   5,000 Units at 09/09/21 0504   insulin aspart (novoLOG) injection 0-15 Units  0-15 Units Subcutaneous TID WC [name], MD   3 Units at 09/08/21 1320   insulin aspart (novoLOG) injection 0-5 Units  0-5 Units Subcutaneous QHS [name], MD   3 Units at 09/07/21 2035   insulin aspart (novoLOG) injection 4 Units  4 Units Subcutaneous TID WC [name], MD   4 Units at 09/08/21 1321   ketorolac (TORADOL) 15 MG/ML injection 15 mg  15 mg Intravenous Q6H PRN [name], MD       morphine 2 MG/ML injection 2-4 mg  2-4 mg Intravenous Q2H PRN [name], MD       morphine 4 MG/ML injection 4 mg  4 mg Intravenous Once Antoine Primas  P, MD       ondansetron (ZOFRAN) injection 4 mg  4 mg Intravenous Once Gilles Chiquito, MD       ondansetron [facility]) injection 4 mg  4 mg Intravenous Q4H PRN [name], MD   4 mg at 09/08/21 1012   oxybutynin (DITROPAN) tablet 5 mg  5 mg Oral Q8H PRN [name], MD       oxyCODONE-acetaminophen (PERCOCET/ROXICET) 5-325 MG per tablet 1-2 tablet  1-2 tablet Oral Q4H PRN [name], MD       phenol (CHLORASEPTIC) mouth spray 1 spray  1 spray Mouth/Throat PRN [name], MD       tamsulosin [facility]) capsule 0.4 mg  0.4 mg Oral Daily Vaillancourt, Samantha, PA-C   0.4 mg at 09/08/21 1012     Objective: Vital signs in last 24 hours: Temp:  [99.1 F (37.3 C)-102.9 F (39.4 C)] 99.8 F (37.7 C) (10/12 0754) Pulse Rate:  [63-92] 91 (10/12 0754) Resp:  [18-20] 18 (10/12 0754) BP: (105-148)/(62-81) 148/81 (10/12 0754) SpO2:  [91  %-100 %] 100 % (10/12 0754)  Intake/Output from previous day: 10/11 0701 - 10/12 0700 In: 656.4 [I.V.:656.4] Out: -  Intake/Output this shift: No intake/output data recorded.   Physical Exam Constitutional:  Well nourished. Alert and oriented, No acute distress. HEENT: [place] AT, moist mucus membranes.  Trachea midline Cardiovascular: No clubbing, cyanosis, or edema. Respiratory: Normal respiratory effort, no increased work of breathing. GI: Abdomen is soft, non tender, non distended, no abdominal masses.  GU: No CVA tenderness.  No bladder fullness or masses.    Neurologic: Grossly intact, no focal deficits, moving all 4 extremities. Psychiatric: Normal mood and affect.    Lab Results:  Recent Labs    09/07/21 1319 09/08/21 0423  WBC 14.2* 10.3  HGB 11.0* 9.7*  HCT 32.2* 29.6*  PLT 149* 118*   BMET Recent Labs    09/07/21 1319 09/08/21 0423  NA 132* 136  K 4.1 4.0  CL 96* 107  CO2 25 23  GLUCOSE 224* 249*  BUN 25* 21  CREATININE 1.48* 1.02*  CALCIUM 8.9 8.4*   PT/INR No results for input(s): LABPROT, INR in the last 72 hours. ABG No results for input(s): PHART, HCO3 in the last 72 hours.  Invalid input(s): PCO2, PO2  Studies/Results: DG Abd 1 View  Result Date: 09/08/2021 CLINICAL DATA:  RIGHT flank and RIGHT lower quadrant pain for 3 weeks, were since Saturday. Kidney stone confirmed 3 weeks ago EXAM: ABDOMEN - 1 VIEW COMPARISON:  CT abdomen and pelvis 08/17/2021 FINDINGS: Multiple pelvic phleboliths. Tiny rounded calcification in the RIGHT pelvis 1-2 mm diameter corresponding to tiny distal ureteral calculus seen on prior CT. Small LEFT paraspinal calcification at L5 corresponds to a phlebolith on prior CT. No additional urinary tract calcifications. Bones demineralized with dextroconvex lumbar scoliosis. Bowel gas pattern normal. IMPRESSION: Persistent visualization of a 1-2 mm distal RIGHT ureteral calculus. Electronically Signed   By: Ulyses Southward M.D.   On:  09/08/2021 14:03   DG OR UROLOGY CYSTO IMAGE ([facility] ONLY)  Result Date: 09/07/2021 There is no interpretation for this exam.  This order is for images obtained during a surgical procedure.  Please See "Surgeries" Tab for more information regarding the procedure.   CT Renal Stone Study  Result Date: 09/07/2021 CLINICAL DATA:  Recurrent right flank pain for 3 weeks. Urinary retention. Nephrolithiasis. EXAM: CT ABDOMEN AND PELVIS WITHOUT CONTRAST TECHNIQUE: Multidetector CT imaging of the abdomen and pelvis was performed  following the standard protocol without IV contrast. COMPARISON:  08/17/2021 FINDINGS: Lower chest: No acute findings. Hepatobiliary: No mass visualized on this unenhanced exam. Prior cholecystectomy. No evidence of biliary obstruction. Pancreas: No mass or inflammatory process visualized on this unenhanced exam. Spleen:  Within normal limits in size. Adrenals/Urinary tract: Mild-to-moderate right-sided hydroureteronephrosis is stable, although there has been near complete resolution of right perinephric fluid since prior study. A 3 mm calculus is again seen in the distal right ureter, which is unchanged in size and location since previous study. Stomach/Bowel: No evidence of obstruction, inflammatory process, or abnormal fluid collections. Vascular/Lymphatic: No pathologically enlarged lymph nodes identified. No evidence of abdominal aortic aneurysm. Congenital duplication of IVC again demonstrated. Reproductive:  No mass or other significant abnormality. Other:  None. Musculoskeletal:  No suspicious bone lesions identified. IMPRESSION: Stable 3 mm distal right ureteral calculus, unchanged in location since prior CT. Stable mild-to-moderate right hydroureteronephrosis, with near complete resolution of right perinephric fluid since prior study. Electronically Signed   By: Danae Orleans M.D.   On: 09/07/2021 15:35     Assessment and Plan: 65 year old female who is POD # 2 for a right  emergent stent placement secondary to a right distal ureteral stone resulting with moderate right-sided hydronephrosis with positive urine culture who continues to have fevers in the setting of culture appropriate antibiotics and decompression of the urinary symptoms by the ureteral stent.  Recommendations: -Encouraged ambulation as her epigastric pain is likely due to gas -ID consulted and will see her this afternoon for continue fever spikes -Will need 10 days of culture appropriate antibiotics once discharged -Continue tamsulosin 0.4 mg daily for stent discomfort -Continue oxybutynin IR 5 mg 3 times daily as needed for stent discomfort -Definitive plans are for outpatient right ureteroscopy with laser lithotripsy and stent exchange in 3 to 4 weeks     LOS: 1 day    [facility] 09/09/2021

## 2021-09-09 NOTE — Plan of Care (Signed)
  Problem: Education: Goal: Knowledge of General Education information will improve Description: Including pain rating scale, medication(s)/side effects and non-pharmacologic comfort measures Outcome: Progressing   Problem: Health Behavior/Discharge Planning: Goal: Ability to manage health-related needs will improve Outcome: Progressing   Problem: Clinical Measurements: Goal: Ability to maintain clinical measurements within normal limits will improve Outcome: Progressing Goal: Will remain free from infection Outcome: Progressing   Problem: Activity: Goal: Risk for activity intolerance will decrease Outcome: Progressing   Problem: Pain Managment: Goal: General experience of comfort will improve Outcome: Progressing   Pt is involved in and agrees with the plan of care. Alert & orientedx4. Febrile at 2:10am- 101.41F ; tylenol given which help decrease her temp to 99.41F. Reports slight epigastric pain; declines pain meds. Complaints of urinary frequency.

## 2021-09-09 NOTE — Consult Note (Signed)
Infectious Disease     Reason for Consult:Bacteremia    Referring Physician: Dr Erlene Quan Date of Admission:  09/07/2021   Active Problems:   Right ureteral stone   HPI: Danielle Dickerson is a 65 y.o. female with hx of DM admitted 10/10 from urology with sepsis (hypotension, WBC 14) and found to have UTI with bacteremia with sensitive E coli and a renal stone. Underwent.  10/10 cysto with R ureteral stent placement. Started IV ceftriaxone and improved slowly but persistently febrile. WBC improving. Still feels weak but eating a little beter. Drinking well. No current dysuria but some abd comfort.  Past Medical History:  Diagnosis Date   COVID-19    05/27/21   Depression    Diabetes mellitus without complication ([place])    Eosinophilic esophagitis    Esophageal stricture 2019   Hyperlipidemia    Past Surgical History:  Procedure Laterality Date   ABDOMINAL HYSTERECTOMY  2006   has ovaries. Had heavy periods and was anemic. HAS cervix   BREAST EXCISIONAL BIOPSY Right 2009   phyllodes removed    CHOLECYSTECTOMY OPEN     COLONOSCOPY WITH PROPOFOL N/A 02/02/2021   Procedure: COLONOSCOPY WITH PROPOFOL;  Surgeon: Lin Landsman, MD;  Location: [facility] ENDOSCOPY;  Service: Gastroenterology;  Laterality: N/A;   CYSTOSCOPY WITH STENT PLACEMENT Right 09/07/2021   Procedure: CYSTOSCOPY WITH STENT PLACEMENT; WITH RETROGRADE PYELOGRAM;  Surgeon: Hollice Espy, MD;  Location: [facility] ORS;  Service: Urology;  Laterality: Right;   Social History   Tobacco Use   Smoking status: Never   Smokeless tobacco: Never  Vaping Use   Vaping Use: Never used  Substance Use Topics   Alcohol use: Not Currently   Drug use: Never   Family History  Problem Relation Age of Onset   Congestive Heart Failure Mother    Colon cancer Father    Esophageal cancer Maternal Uncle    Thyroid cancer Neg Hx     Allergies:  Allergies  Allergen Reactions   Pneumovax 23 [Pneumococcal Vac Polyvalent] Swelling    Causes arm  swelling, redness.   Prevnar 13 [Pneumococcal 13-Val Conj Vacc] Swelling    Current antibiotics: Antibiotics Given (last 72 hours)     Date/Time Action Medication Dose Rate   09/07/21 1616 New Bag/Given   cefTRIAXone (ROCEPHIN) 1 g in sodium chloride 0.9 % 100 mL IVPB 1 g 200 mL/hr   09/08/21 0511 New Bag/Given   cefTRIAXone (ROCEPHIN) 2 g in sodium chloride 0.9 % 100 mL IVPB 2 g 200 mL/hr   09/09/21 0456 New Bag/Given   cefTRIAXone (ROCEPHIN) 2 g in sodium chloride 0.9 % 100 mL IVPB 2 g 200 mL/hr       MEDICATIONS:  docusate sodium  100 mg Oral BID   heparin  5,000 Units Subcutaneous Q8H   insulin aspart  0-15 Units Subcutaneous TID WC   insulin aspart  0-5 Units Subcutaneous QHS   insulin aspart  4 Units Subcutaneous TID WC    morphine injection  4 mg Intravenous Once   ondansetron (ZOFRAN) IV  4 mg Intravenous Once   tamsulosin  0.4 mg Oral Daily    Review of Systems - 11 systems reviewed and negative per HPI   OBJECTIVE: Temp:  [99.1 F (37.3 C)-101.6 F (38.7 C)] 100 F (37.8 C) (10/12 1554) Pulse Rate:  [73-92] 73 (10/12 1554) Resp:  [18-20] 19 (10/12 1554) BP: (105-148)/(62-81) 135/75 (10/12 1554) SpO2:  [91 %-100 %] 96 % (10/12 1554)   LABS: Results for  orders placed or performed during the hospital encounter of 09/07/21 (from the past 48 hour(s))  CBG monitoring, ED     Status: Abnormal   Collection Time: 09/07/21  4:13 PM  Result Value Ref Range   Glucose-Capillary 170 (H) 70 - 99 mg/dL    Comment: Glucose reference range applies only to samples taken after fasting for at least 8 hours.  Resp Panel by RT-PCR (Flu A&B, Covid) Nasopharyngeal Swab     Status: None   Collection Time: 09/07/21  4:50 PM   Specimen: Nasopharyngeal Swab; Nasopharyngeal(NP) swabs in vial transport medium  Result Value Ref Range   SARS Coronavirus 2 by RT PCR NEGATIVE NEGATIVE    Comment: (NOTE) SARS-CoV-2 target nucleic acids are NOT DETECTED.  The SARS-CoV-2 RNA is  generally detectable in upper respiratory specimens during the acute phase of infection. The lowest concentration of SARS-CoV-2 viral copies this assay can detect is 138 copies/mL. A negative result does not preclude SARS-Cov-2 infection and should not be used as the sole basis for treatment or other patient management decisions. A negative result may occur with  improper specimen collection/handling, submission of specimen other than nasopharyngeal swab, presence of viral mutation(s) within the areas targeted by this assay, and inadequate number of viral copies(<138 copies/mL). A negative result must be combined with clinical observations, patient history, and epidemiological information. The expected result is Negative.  Fact Sheet for Patients:  EntrepreneurPulse.com.au  Fact Sheet for Healthcare Providers:  IncredibleEmployment.be  This test is no t yet approved or cleared by the [place] FDA and  has been authorized for detection and/or diagnosis of SARS-CoV-2 by FDA under an Emergency Use Authorization (EUA). This EUA will remain  in effect (meaning this test can be used) for the duration of the COVID-19 declaration under Section 564(b)(1) of the Act, 21 U.S.C.section 360bbb-3(b)(1), unless the authorization is terminated  or revoked sooner.       Influenza A by PCR NEGATIVE NEGATIVE   Influenza B by PCR NEGATIVE NEGATIVE    Comment: (NOTE) The Xpert Xpress SARS-CoV-2/FLU/RSV plus assay is intended as an aid in the diagnosis of influenza from Nasopharyngeal swab specimens and should not be used as a sole basis for treatment. Nasal washings and aspirates are unacceptable for Xpert Xpress SARS-CoV-2/FLU/RSV testing.  Fact Sheet for Patients: EntrepreneurPulse.com.au  Fact Sheet for Healthcare Providers: IncredibleEmployment.be  This test is not yet approved or cleared by the [place] FDA  and has been authorized for detection and/or diagnosis of SARS-CoV-2 by FDA under an Emergency Use Authorization (EUA). This EUA will remain in effect (meaning this test can be used) for the duration of the COVID-19 declaration under Section 564(b)(1) of the Act, 21 U.S.C. section 360bbb-3(b)(1), unless the authorization is terminated or revoked.  Performed at [facility], [address] [place], [place] [address]   CBG monitoring, ED     Status: Abnormal   Collection Time: 09/07/21  4:57 PM  Result Value Ref Range   Glucose-Capillary 175 (H) 70 - 99 mg/dL    Comment: Glucose reference range applies only to samples taken after fasting for at least 8 hours.  Blood culture (routine x 2)     Status: None (Preliminary result)   Collection Time: 09/07/21  6:27 PM   Specimen: BLOOD  Result Value Ref Range   Specimen Description BLOOD RIGHT ANTECUBITAL    Special Requests      BOTTLES DRAWN AEROBIC AND ANAEROBIC Blood Culture results may not be optimal due  to an excessive volume of blood received in culture bottles   Culture      NO GROWTH 2 DAYS Performed at [facility], [address] [place], [place] [address]    Report Status PENDING   HIV Antibody (routine testing w rflx)     Status: None   Collection Time: 09/07/21  6:27 PM  Result Value Ref Range   HIV Screen 4th Generation wRfx Non Reactive Non Reactive    Comment: Performed at [facility], [place] [address], [place], [place] [address]  Glucose, capillary     Status: Abnormal   Collection Time: 09/07/21  8:28 PM  Result Value Ref Range   Glucose-Capillary 260 (H) 70 - 99 mg/dL    Comment: Glucose reference range applies only to samples taken after fasting for at least 8 hours.  Basic metabolic panel     Status: Abnormal   Collection Time: 09/08/21  4:23 AM  Result Value Ref Range   Sodium 136 135 - 145 mmol/L   Potassium 4.0 3.5 - 5.1 mmol/L   Chloride 107 98 - 111 mmol/L   CO2 23 22 - 32  mmol/L   Glucose, Bld 249 (H) 70 - 99 mg/dL    Comment: Glucose reference range applies only to samples taken after fasting for at least 8 hours.   BUN 21 8 - 23 mg/dL   Creatinine, Ser 1.02 (H) 0.44 - 1.00 mg/dL   Calcium 8.4 (L) 8.9 - 10.3 mg/dL   GFR, Estimated >60 >60 mL/min    Comment: (NOTE) Calculated using the CKD-EPI Creatinine Equation (2021)    Anion gap 6 5 - 15    Comment: Performed at [facility], [address] [place], [place] [address]  CBC     Status: Abnormal   Collection Time: 09/08/21  4:23 AM  Result Value Ref Range   WBC 10.3 4.0 - 10.5 K/uL   RBC 3.45 (L) 3.87 - 5.11 MIL/uL   Hemoglobin 9.7 (L) 12.0 - 15.0 g/dL   HCT 29.6 (L) 36.0 - 46.0 %   MCV 85.8 80.0 - 100.0 fL   MCH 28.1 26.0 - 34.0 pg   MCHC 32.8 30.0 - 36.0 g/dL   RDW 13.2 11.5 - 15.5 %   Platelets 118 (L) 150 - 400 K/uL    Comment: Immature Platelet Fraction may be clinically indicated, consider ordering this additional test [id number]    nRBC 0.0 0.0 - 0.2 %    Comment: Performed at [facility], [address] [place], [place] [address]  Glucose, capillary     Status: Abnormal   Collection Time: 09/08/21  8:01 AM  Result Value Ref Range   Glucose-Capillary 204 (H) 70 - 99 mg/dL    Comment: Glucose reference range applies only to samples taken after fasting for at least 8 hours.   Comment 1 Notify RN    Comment 2 Document in Chart   Glucose, capillary     Status: Abnormal   Collection Time: 09/08/21 11:39 AM  Result Value Ref Range   Glucose-Capillary 161 (H) 70 - 99 mg/dL    Comment: Glucose reference range applies only to samples taken after fasting for at least 8 hours.   Comment 1 Notify RN    Comment 2 Document in Chart   Glucose, capillary     Status: Abnormal   Collection Time: 09/08/21  4:55 PM  Result Value Ref Range   Glucose-Capillary 112 (H) 70 - 99 mg/dL  Comment: Glucose reference range applies only to samples taken after fasting for at least 8  hours.   Comment 1 Notify RN    Comment 2 Document in Chart   Glucose, capillary     Status: Abnormal   Collection Time: 09/08/21  9:16 PM  Result Value Ref Range   Glucose-Capillary 157 (H) 70 - 99 mg/dL    Comment: Glucose reference range applies only to samples taken after fasting for at least 8 hours.  Glucose, capillary     Status: Abnormal   Collection Time: 09/09/21  7:57 AM  Result Value Ref Range   Glucose-Capillary 123 (H) 70 - 99 mg/dL    Comment: Glucose reference range applies only to samples taken after fasting for at least 8 hours.  Glucose, capillary     Status: Abnormal   Collection Time: 09/09/21 11:56 AM  Result Value Ref Range   Glucose-Capillary 156 (H) 70 - 99 mg/dL    Comment: Glucose reference range applies only to samples taken after fasting for at least 8 hours.   Comment 1 Notify RN    Comment 2 Document in Chart    No components found for: ESR, C REACTIVE PROTEIN MICRO: Recent Results (from the past 720 hour(s))  Resp Panel by RT-PCR (Flu A&B, Covid) Nasopharyngeal Swab     Status: None   Collection Time: 08/17/21  2:15 AM   Specimen: Nasopharyngeal Swab; Nasopharyngeal(NP) swabs in vial transport medium  Result Value Ref Range Status   SARS Coronavirus 2 by RT PCR NEGATIVE NEGATIVE Final    Comment: (NOTE) SARS-CoV-2 target nucleic acids are NOT DETECTED.  The SARS-CoV-2 RNA is generally detectable in upper respiratory specimens during the acute phase of infection. The lowest concentration of SARS-CoV-2 viral copies this assay can detect is 138 copies/mL. A negative result does not preclude SARS-Cov-2 infection and should not be used as the sole basis for treatment or other patient management decisions. A negative result may occur with  improper specimen collection/handling, submission of specimen other than nasopharyngeal swab, presence of viral mutation(s) within the areas targeted by this assay, and inadequate number of viral copies(<138  copies/mL). A negative result must be combined with clinical observations, patient history, and epidemiological information. The expected result is Negative.  Fact Sheet for Patients:  EntrepreneurPulse.com.au  Fact Sheet for Healthcare Providers:  IncredibleEmployment.be  This test is no t yet approved or cleared by the [place] FDA and  has been authorized for detection and/or diagnosis of SARS-CoV-2 by FDA under an Emergency Use Authorization (EUA). This EUA will remain  in effect (meaning this test can be used) for the duration of the COVID-19 declaration under Section 564(b)(1) of the Act, 21 U.S.C.section 360bbb-3(b)(1), unless the authorization is terminated  or revoked sooner.       Influenza A by PCR NEGATIVE NEGATIVE Final   Influenza B by PCR NEGATIVE NEGATIVE Final    Comment: (NOTE) The Xpert Xpress SARS-CoV-2/FLU/RSV plus assay is intended as an aid in the diagnosis of influenza from Nasopharyngeal swab specimens and should not be used as a sole basis for treatment. Nasal washings and aspirates are unacceptable for Xpert Xpress SARS-CoV-2/FLU/RSV testing.  Fact Sheet for Patients: EntrepreneurPulse.com.au  Fact Sheet for Healthcare Providers: IncredibleEmployment.be  This test is not yet approved or cleared by the [place] FDA and has been authorized for detection and/or diagnosis of SARS-CoV-2 by FDA under an Emergency Use Authorization (EUA). This EUA will remain in effect (meaning this test can be  used) for the duration of the COVID-19 declaration under Section 564(b)(1) of the Act, 21 U.S.C. section 360bbb-3(b)(1), unless the authorization is terminated or revoked.  Performed at [facility], [address] [place], [place] [address]   Microscopic Examination     Status: Abnormal   Collection Time: 08/19/21 11:18 AM   Urine  Result Value Ref Range Status    WBC, UA 0-5 0 - 5 /hpf Final   RBC 0-2 0 - 2 /hpf Final   Epithelial Cells (non renal) 0-10 0 - 10 /hpf Final   Crystals Present (A) N/A Final   Crystal Type Calcium Oxalate N/A Final   Bacteria, UA Few None seen/Few Final  Urine Culture     Status: Abnormal   Collection Time: 09/07/21 10:27 AM   Specimen: Urine, Random  Result Value Ref Range Status   Specimen Description   Final    URINE, RANDOM Performed at [facility] [place], [address], [place], [place] [address]    Special Requests   Final    NONE Performed at [facility], [address], [place], [place] [address]    Culture >=100,000 COLONIES/mL ESCHERICHIA COLI (A)  Final   Report Status 09/09/2021 FINAL  Final   Organism ID, Bacteria ESCHERICHIA COLI (A)  Final      Susceptibility   Escherichia coli - MIC*    AMPICILLIN 4 SENSITIVE Sensitive     CEFAZOLIN <=4 SENSITIVE Sensitive     CEFEPIME <=0.12 SENSITIVE Sensitive     CEFTRIAXONE <=0.25 SENSITIVE Sensitive     CIPROFLOXACIN <=0.25 SENSITIVE Sensitive     GENTAMICIN <=1 SENSITIVE Sensitive     IMIPENEM <=0.25 SENSITIVE Sensitive     NITROFURANTOIN <=16 SENSITIVE Sensitive     TRIMETH/SULFA <=20 SENSITIVE Sensitive     AMPICILLIN/SULBACTAM <=2 SENSITIVE Sensitive     PIP/TAZO <=4 SENSITIVE Sensitive     * >=100,000 COLONIES/mL ESCHERICHIA COLI  Blood culture (routine x 2)     Status: Abnormal (Preliminary result)   Collection Time: 09/07/21  1:19 PM   Specimen: BLOOD  Result Value Ref Range Status   Specimen Description   Final    BLOOD LEFT ANTECUBITAL Performed at [facility], [address] [place], [place] [address]    Special Requests   Final    BOTTLES DRAWN AEROBIC AND ANAEROBIC Blood Culture adequate volume Performed at [facility], [address] [place], [place] [address]    Culture  Setup Time   Final    GRAM NEGATIVE RODS IN BOTH AEROBIC AND ANAEROBIC BOTTLES CRITICAL RESULT CALLED TO, READ BACK  BY AND VERIFIED WITH: JASON [email or web address]  10/11/22RH  Performed at [facility], [address], [place], [place] [address]    Culture (A)  Final    ESCHERICHIA COLI SUSCEPTIBILITIES TO FOLLOW Performed at [facility], [place] [address] [place], [place] [address]    Report Status PENDING  Incomplete  Blood Culture ID Panel (Reflexed)     Status: Abnormal   Collection Time: 09/07/21  1:19 PM  Result Value Ref Range Status   Enterococcus faecalis NOT DETECTED NOT DETECTED Final   Enterococcus Faecium NOT DETECTED NOT DETECTED Final   Listeria monocytogenes NOT DETECTED NOT DETECTED Final   Staphylococcus species NOT DETECTED NOT DETECTED Final   Staphylococcus aureus (BCID) NOT DETECTED NOT DETECTED Final   Staphylococcus epidermidis NOT DETECTED NOT DETECTED Final   Staphylococcus lugdunensis NOT DETECTED NOT DETECTED Final   Streptococcus  species NOT DETECTED NOT DETECTED Final   Streptococcus agalactiae NOT DETECTED NOT DETECTED Final   Streptococcus pneumoniae NOT DETECTED NOT DETECTED Final   Streptococcus pyogenes NOT DETECTED NOT DETECTED Final   A.calcoaceticus-baumannii NOT DETECTED NOT DETECTED Final   Bacteroides fragilis NOT DETECTED NOT DETECTED Final   Enterobacterales DETECTED (A) NOT DETECTED Final    Comment: Enterobacterales represent a large order of gram negative bacteria, not a single organism. CRITICAL RESULT CALLED TO, READ BACK BY AND VERIFIED WITH: JASON [email or web address]  09/08/21 RH    Enterobacter cloacae complex NOT DETECTED NOT DETECTED Final   Escherichia coli DETECTED (A) NOT DETECTED Final    Comment: CRITICAL RESULT CALLED TO, READ BACK BY AND VERIFIED WITH: JASON [email or web address]  09/08/21 RH    Klebsiella aerogenes NOT DETECTED NOT DETECTED Final   Klebsiella oxytoca NOT DETECTED NOT DETECTED Final   Klebsiella pneumoniae NOT DETECTED NOT DETECTED Final   Proteus species NOT DETECTED NOT DETECTED Final   Salmonella species NOT DETECTED NOT  DETECTED Final   Serratia marcescens NOT DETECTED NOT DETECTED Final   Haemophilus influenzae NOT DETECTED NOT DETECTED Final   Neisseria meningitidis NOT DETECTED NOT DETECTED Final   Pseudomonas aeruginosa NOT DETECTED NOT DETECTED Final   Stenotrophomonas maltophilia NOT DETECTED NOT DETECTED Final   Candida albicans NOT DETECTED NOT DETECTED Final   Candida auris NOT DETECTED NOT DETECTED Final   Candida glabrata NOT DETECTED NOT DETECTED Final   Candida krusei NOT DETECTED NOT DETECTED Final   Candida parapsilosis NOT DETECTED NOT DETECTED Final   Candida tropicalis NOT DETECTED NOT DETECTED Final   Cryptococcus neoformans/gattii NOT DETECTED NOT DETECTED Final   CTX-M ESBL NOT DETECTED NOT DETECTED Final   Carbapenem resistance IMP NOT DETECTED NOT DETECTED Final   Carbapenem resistance KPC NOT DETECTED NOT DETECTED Final   Carbapenem resistance NDM NOT DETECTED NOT DETECTED Final   Carbapenem resist OXA 48 LIKE NOT DETECTED NOT DETECTED Final   Carbapenem resistance VIM NOT DETECTED NOT DETECTED Final    Comment: Performed at [facility], [address] [place], [place] [address]  Resp Panel by RT-PCR (Flu A&B, Covid) Nasopharyngeal Swab     Status: None   Collection Time: 09/07/21  4:50 PM   Specimen: Nasopharyngeal Swab; Nasopharyngeal(NP) swabs in vial transport medium  Result Value Ref Range Status   SARS Coronavirus 2 by RT PCR NEGATIVE NEGATIVE Final    Comment: (NOTE) SARS-CoV-2 target nucleic acids are NOT DETECTED.  The SARS-CoV-2 RNA is generally detectable in upper respiratory specimens during the acute phase of infection. The lowest concentration of SARS-CoV-2 viral copies this assay can detect is 138 copies/mL. A negative result does not preclude SARS-Cov-2 infection and should not be used as the sole basis for treatment or other patient management decisions. A negative result may occur with  improper specimen collection/handling, submission of  specimen other than nasopharyngeal swab, presence of viral mutation(s) within the areas targeted by this assay, and inadequate number of viral copies(<138 copies/mL). A negative result must be combined with clinical observations, patient history, and epidemiological information. The expected result is Negative.  Fact Sheet for Patients:  EntrepreneurPulse.com.au  Fact Sheet for Healthcare Providers:  IncredibleEmployment.be  This test is no t yet approved or cleared by the [place] FDA and  has been authorized for detection and/or diagnosis of SARS-CoV-2 by FDA under an Emergency Use Authorization (EUA). This EUA will remain  in effect (meaning this test can be used) for the duration of the  COVID-19 declaration under Section 564(b)(1) of the Act, 21 U.S.C.section 360bbb-3(b)(1), unless the authorization is terminated  or revoked sooner.       Influenza A by PCR NEGATIVE NEGATIVE Final   Influenza B by PCR NEGATIVE NEGATIVE Final    Comment: (NOTE) The Xpert Xpress SARS-CoV-2/FLU/RSV plus assay is intended as an aid in the diagnosis of influenza from Nasopharyngeal swab specimens and should not be used as a sole basis for treatment. Nasal washings and aspirates are unacceptable for Xpert Xpress SARS-CoV-2/FLU/RSV testing.  Fact Sheet for Patients: EntrepreneurPulse.com.au  Fact Sheet for Healthcare Providers: IncredibleEmployment.be  This test is not yet approved or cleared by the [place] FDA and has been authorized for detection and/or diagnosis of SARS-CoV-2 by FDA under an Emergency Use Authorization (EUA). This EUA will remain in effect (meaning this test can be used) for the duration of the COVID-19 declaration under Section 564(b)(1) of the Act, 21 U.S.C. section 360bbb-3(b)(1), unless the authorization is terminated or revoked.  Performed at [facility], [address] [place], [place] [address]   Blood culture (routine x 2)     Status: None (Preliminary result)   Collection Time: 09/07/21  6:27 PM   Specimen: BLOOD  Result Value Ref Range Status   Specimen Description BLOOD RIGHT ANTECUBITAL  Final   Special Requests   Final    BOTTLES DRAWN AEROBIC AND ANAEROBIC Blood Culture results may not be optimal due to an excessive volume of blood received in culture bottles   Culture   Final    NO GROWTH 2 DAYS Performed at [facility], [address] [place], [place] [address]    Report Status PENDING  Incomplete    IMAGING: DG Abd 1 View  Result Date: 09/08/2021 CLINICAL DATA:  RIGHT flank and RIGHT lower quadrant pain for 3 weeks, were since Saturday. Kidney stone confirmed 3 weeks ago EXAM: ABDOMEN - 1 VIEW COMPARISON:  CT abdomen and pelvis 08/17/2021 FINDINGS: Multiple pelvic phleboliths. Tiny rounded calcification in the RIGHT pelvis 1-2 mm diameter corresponding to tiny distal ureteral calculus seen on prior CT. Small LEFT paraspinal calcification at L5 corresponds to a phlebolith on prior CT. No additional urinary tract calcifications. Bones demineralized with dextroconvex lumbar scoliosis. Bowel gas pattern normal. IMPRESSION: Persistent visualization of a 1-2 mm distal RIGHT ureteral calculus. Electronically Signed   By: Lavonia Dana M.D.   On: 09/08/2021 14:03   DG OR UROLOGY CYSTO IMAGE ([facility] ONLY)  Result Date: 09/07/2021 There is no interpretation for this exam.  This order is for images obtained during a surgical procedure.  Please See "Surgeries" Tab for more information regarding the procedure.   CT Renal Stone Study  Result Date: 09/07/2021 CLINICAL DATA:  Recurrent right flank pain for 3 weeks. Urinary retention. Nephrolithiasis. EXAM: CT ABDOMEN AND PELVIS WITHOUT CONTRAST TECHNIQUE: Multidetector CT imaging of the abdomen and pelvis was performed following the standard protocol without IV contrast. COMPARISON:  08/17/2021  FINDINGS: Lower chest: No acute findings. Hepatobiliary: No mass visualized on this unenhanced exam. Prior cholecystectomy. No evidence of biliary obstruction. Pancreas: No mass or inflammatory process visualized on this unenhanced exam. Spleen:  Within normal limits in size. Adrenals/Urinary tract: Mild-to-moderate right-sided hydroureteronephrosis is stable, although there has been near complete resolution of right perinephric fluid since prior study. A 3 mm calculus is again seen in the distal right ureter, which is unchanged in size and location since previous study. Stomach/Bowel: No evidence of obstruction, inflammatory process, or abnormal  fluid collections. Vascular/Lymphatic: No pathologically enlarged lymph nodes identified. No evidence of abdominal aortic aneurysm. Congenital duplication of IVC again demonstrated. Reproductive:  No mass or other significant abnormality. Other:  None. Musculoskeletal:  No suspicious bone lesions identified. IMPRESSION: Stable 3 mm distal right ureteral calculus, unchanged in location since prior CT. Stable mild-to-moderate right hydroureteronephrosis, with near complete resolution of right perinephric fluid since prior study. Electronically Signed   By: Marlaine Hind M.D.   On: 09/07/2021 15:35   CT Renal Stone Study  Result Date: 08/17/2021 CLINICAL DATA:  Flank pain EXAM: CT ABDOMEN AND PELVIS WITHOUT CONTRAST TECHNIQUE: Multidetector CT imaging of the abdomen and pelvis was performed following the standard protocol without IV contrast. COMPARISON:  None. FINDINGS: LOWER CHEST: Normal. HEPATOBILIARY: Normal hepatic contours. No intra- or extrahepatic biliary dilatation. Status post cholecystectomy. PANCREAS: Normal pancreas. No ductal dilatation or peripancreatic fluid collection. SPLEEN: Normal. ADRENALS/URINARY TRACT: The adrenal glands are normal. There is a stone within the distal right ureter measuring 3 mm, causing moderate hydroureteronephrosis and mild  perinephric stranding. The urinary bladder is normal for degree of distention. No left nephroureterolithiasis. STOMACH/BOWEL: There is no hiatal hernia. Normal duodenal course and caliber. No small bowel dilatation or inflammation. No focal colonic abnormality. Normal appendix. VASCULAR/LYMPHATIC: Normal course and caliber of the major abdominal vessels. No abdominal or pelvic lymphadenopathy. REPRODUCTIVE: Status post hysterectomy. No adnexal mass. MUSCULOSKELETAL. No bony spinal canal stenosis or focal osseous abnormality. OTHER: None. IMPRESSION: Right-sided obstructive uropathy with 3 mm stone in the distal right ureter causing moderate hydroureteronephrosis and mild perinephric stranding. Electronically Signed   By: Ulyses Jarred M.D.   On: 08/17/2021 01:06    Assessment:   Danielle Dickerson is a 65 y.o. female with DM admitted with urosepsis with E coli bacteremia from R obstructive nephrolithiasis, s/p stent placement for decompression 10/10 She has improved clinically and wbc down but persistently febrile. On appropriate therapy with ceftriaxone.    Recommendations Would monitor overnight and if improving can give dose of ceftrixone in am and DC on oral bactrim DS 1 tab BID for 14 days min with followup with urology for stone removal.  If definitive stone removal and stent removal is not feasible within 2-3 weeks would consider extending the abx course until time of surgical removal.  I can see in ID clinic in 10-14 days if needed.   Thank you very much for allowing me to participate in the care of this patient. Please call with questions.   Cheral Marker. Ola Spurr, MD

## 2021-09-09 NOTE — Anesthesia Postprocedure Evaluation (Signed)
Anesthesia Post Note  Patient: Danielle Dickerson  Procedure(s) Performed: CYSTOSCOPY WITH STENT PLACEMENT; WITH RETROGRADE PYELOGRAM (Right: Ureter)  Patient location during evaluation: PACU Anesthesia Type: General Level of consciousness: awake and alert Pain management: pain level controlled Vital Signs Assessment: post-procedure vital signs reviewed and stable Respiratory status: spontaneous breathing, nonlabored ventilation, respiratory function stable and patient connected to nasal cannula oxygen Cardiovascular status: blood pressure returned to baseline and stable Postop Assessment: no apparent nausea or vomiting Anesthetic complications: no   No notable events documented.   Last Vitals:  Vitals:   09/09/21 0515 09/09/21 0754  BP:  (!) 148/81  Pulse:  91  Resp:  18  Temp: 37.6 C 37.7 C  SpO2:  100%    Last Pain:  Vitals:   09/09/21 0810  TempSrc:   PainSc: 0-No pain                 Yevette Edwards

## 2021-09-10 ENCOUNTER — Telehealth: Payer: Self-pay | Admitting: Urology

## 2021-09-10 LAB — CULTURE, BLOOD (ROUTINE X 2): Special Requests: ADEQUATE

## 2021-09-10 LAB — BASIC METABOLIC PANEL
Anion gap: 10 (ref 5–15)
BUN: 10 mg/dL (ref 8–23)
CO2: 26 mmol/L (ref 22–32)
Calcium: 8.4 mg/dL — ABNORMAL LOW (ref 8.9–10.3)
Chloride: 101 mmol/L (ref 98–111)
Creatinine, Ser: 0.97 mg/dL (ref 0.44–1.00)
GFR, Estimated: >60 mL/min (ref >60)
Glucose, Bld: 132 mg/dL — ABNORMAL HIGH (ref 70–99)
Potassium: 3.4 mmol/L — ABNORMAL LOW (ref 3.5–5.1)
Sodium: 137 mmol/L (ref 135–145)

## 2021-09-10 LAB — CBC
HCT: 27.6 % — ABNORMAL LOW (ref 36.0–46.0)
Hemoglobin: 9.6 g/dL — ABNORMAL LOW (ref 12.0–15.0)
MCH: 29.5 pg (ref 26.0–34.0)
MCHC: 34.8 g/dL (ref 30.0–36.0)
MCV: 84.9 fL (ref 80.0–100.0)
Platelets: 107 10*3/uL — ABNORMAL LOW (ref 150–400)
RBC: 3.25 MIL/uL — ABNORMAL LOW (ref 3.87–5.11)
RDW: 13.2 % (ref 11.5–15.5)
WBC: 3.5 10*3/uL — ABNORMAL LOW (ref 4.0–10.5)
nRBC: 0 % (ref 0.0–0.2)

## 2021-09-10 LAB — GLUCOSE, CAPILLARY: Glucose-Capillary: 124 mg/dL — ABNORMAL HIGH (ref 70–99)

## 2021-09-10 MED ORDER — OXYBUTYNIN CHLORIDE 5 MG PO TABS: 5.0000 mg | ORAL_TABLET | Freq: Three times a day (TID) | ORAL | 1 refills | Status: DC | PRN

## 2021-09-10 MED ORDER — TAMSULOSIN HCL 0.4 MG PO CAPS: 0.4000 mg | ORAL_CAPSULE | Freq: Every day | ORAL | 0 refills | Status: DC

## 2021-09-10 MED ORDER — SULFAMETHOXAZOLE-TRIMETHOPRIM 800-160 MG PO TABS: 1.0000 | ORAL_TABLET | Freq: Two times a day (BID) | ORAL | 0 refills | Status: AC

## 2021-09-10 NOTE — Discharge Instructions (Addendum)
Normal stent symptoms include flank pain, bladder pain, blood in the urine, burning with urination, and urinary urgency/frequency/leakage. Please continue Flomax once daily and you may also take oxybutynin 5mg  every 8 hours as needed to help with these symptoms.  We're transitioning you to the oral antibiotic Bactrim (sulfamethoxazole-trimethoprim) after discharge. You may start these tomorrow morning.

## 2021-09-10 NOTE — Progress Notes (Signed)
PIV removed. AVS reviewed with patient and husband. Patient verbalizes understanding of medication regiment and follow up appointments.

## 2021-09-10 NOTE — Telephone Encounter (Signed)
Per Dr. Apolinar Junes Patient is to be scheduled for Right Ureteroscopy,laser Lithotripsy,Ureteral Stent Exchange  Pt.'s husband Danielle Dickerson  was contacted and possible surgical dates were discussed, 09/21/21 was agreed upon for surgery.  Patient was directed to call [phone] between 1-3pm the day before surgery to find out surgical arrival time.  Instructions were given not to eat or drink from midnight on the night before surgery and have a driver for the day of surgery. On the surgery day patient was instructed to enter through the [facility] entrance of [facility] report the Same Day Surgery desk.   Pre-Admit Testing will be in contact via phone to set up an interview with the anesthesia team to review your history and medications prior to surgery.   Reminder of this information was sent via Mychart to the patient.   Patient is to hold anticoag's  and Ok to continue ASA per Dr. Apolinar Junes.

## 2021-09-10 NOTE — Progress Notes (Signed)
[facility] Posting Form   Surgery Date/Time: Date: 09/21/2021  Surgeon: Dr. [name], MD  Surgery Location: Day Surgery  Inpt ( No  )   Outpt (Yes)   Obs ( No  )   Diagnosis: N20.1 Right Ureteral Stent  -CPT: 88416  Surgery: Right Ureteroscopy with laser lithotripsy and stent exchange  Stop Anticoagulations: Yes; ok to continue ASA  Cardiac/Medical/Pulmonary Clearance needed: none needed  *Orders entered into EPIC  Date: 09/10/21   *Case booked in [place]  Date: 09/10/21  *Notified pt of Surgery: Date: 09/10/21  PRE-OP UA & CX: no  *Placed into Prior Authorization Work [place] Date: 09/10/21   Assistant/laser/rep:No

## 2021-09-10 NOTE — Discharge Summary (Signed)
Date of admission: 09/07/2021  Date of discharge: 09/10/2021  Admission diagnosis: Right obstructing ureteral stone, sepsis  Discharge diagnosis: Same as above  Secondary diagnoses:  Patient Active Problem List   Diagnosis Date Noted   Right ureteral stone 09/07/2021   Bilious emesis 08/18/2021   Renal stone 08/18/2021   Pernicious anemia 07/03/2021   Atypical squamous cell changes of undetermined significance (ASCUS) on cervical cytology with negative high risk human papilloma virus (HPV) test result 05/08/2020   HTN (hypertension) 05/06/2020   Hot flashes 05/06/2020   DM (diabetes mellitus) ([place]) 01/30/2020   HLD (hyperlipidemia) 01/30/2020   GERD (gastroesophageal reflux disease) 01/30/2020   History and Physical: For full details, please see admission history and physical. Briefly, Danielle Dickerson is a 65 y.o. year old patient admitted on 09/07/2021 for urgent right ureteral stent placement with Dr. Erlene Quan for management of an obstructing right ureteral stone with sepsis.   Last fever was yesterday at [facility], however overall fever curve is improving.  ID consulted and felt that Rocephin was appropriate given culture results and recommending discharge on Bactrim.  She is afebrile, VSS this morning.  She denies flank pain.  She reports she would like to go home she has been unable to sleep through the night since her admission and is generally feeling fatigued and uncomfortable being in the hospital longer than planned.  A.m. labs notable for stable hemoglobin, mild leukocytopenia, and normalized creatinine.  Physical Exam: Constitutional:  Alert and oriented, no acute distress, nontoxic appearing HEENT: [place], AT Cardiovascular: No clubbing, cyanosis, or edema Respiratory: Normal respiratory effort, no increased work of breathing Skin: No rashes, bruises or suspicious lesions Neurologic: Grossly intact, no focal deficits, moving all 4 extremities Psychiatric: Normal mood and affect    Hospital Course: Patient tolerated the procedure well.  She was then transferred to the floor after an uneventful PACU stay.  Her hospital course was uncomplicated.  On POD#3 she had met discharge criteria: was eating a regular diet, was up and ambulating independently,  pain was well controlled, was voiding without a catheter, and was ready for discharge.  Laboratory values:  Recent Labs    09/07/21 1319 09/08/21 0423 09/10/21 0828  WBC 14.2* 10.3 3.5*  HGB 11.0* 9.7* 9.6*  HCT 32.2* 29.6* 27.6*   Recent Labs    09/07/21 1319 09/08/21 0423 09/10/21 0828  NA 132* 136 137  K 4.1 4.0 3.4*  CL 96* 107 101  CO2 25 23 26   GLUCOSE 224* 249* 132*  BUN 25* 21 10  CREATININE 1.48* 1.02* 0.97  CALCIUM 8.9 8.4* 8.4*   Results for orders placed or performed during the hospital encounter of 09/07/21  Blood culture (routine x 2)     Status: Abnormal   Collection Time: 09/07/21  1:19 PM   Specimen: BLOOD  Result Value Ref Range Status   Specimen Description   Final    BLOOD LEFT ANTECUBITAL Performed at [facility], [address], [place], [place] [address]    Special Requests   Final    BOTTLES DRAWN AEROBIC AND ANAEROBIC Blood Culture adequate volume Performed at [facility], [address], [place], [place] [address]    Culture  Setup Time   Final    GRAM NEGATIVE RODS IN BOTH AEROBIC AND ANAEROBIC BOTTLES CRITICAL RESULT CALLED TO, READ BACK BY AND VERIFIED WITH: Saks [email or web address]  10/11/22RH  Performed at [facility], [address] [place],  [address]    Culture ESCHERICHIA COLI (A)  Final   Report Status 09/10/2021 FINAL  Final   Organism ID, Bacteria ESCHERICHIA COLI  Final      Susceptibility   Escherichia coli - MIC*    AMPICILLIN 4 SENSITIVE Sensitive     CEFAZOLIN <=4 SENSITIVE Sensitive     CEFEPIME <=0.12 SENSITIVE Sensitive     CEFTAZIDIME <=1 SENSITIVE Sensitive     CEFTRIAXONE <=0.25 SENSITIVE Sensitive      CIPROFLOXACIN <=0.25 SENSITIVE Sensitive     GENTAMICIN <=1 SENSITIVE Sensitive     IMIPENEM <=0.25 SENSITIVE Sensitive     TRIMETH/SULFA <=20 SENSITIVE Sensitive     AMPICILLIN/SULBACTAM <=2 SENSITIVE Sensitive     PIP/TAZO <=4 SENSITIVE Sensitive     * ESCHERICHIA COLI  Blood Culture ID Panel (Reflexed)     Status: Abnormal   Collection Time: 09/07/21  1:19 PM  Result Value Ref Range Status   Enterococcus faecalis NOT DETECTED NOT DETECTED Final   Enterococcus Faecium NOT DETECTED NOT DETECTED Final   Listeria monocytogenes NOT DETECTED NOT DETECTED Final   Staphylococcus species NOT DETECTED NOT DETECTED Final   Staphylococcus aureus (BCID) NOT DETECTED NOT DETECTED Final   Staphylococcus epidermidis NOT DETECTED NOT DETECTED Final   Staphylococcus lugdunensis NOT DETECTED NOT DETECTED Final   Streptococcus species NOT DETECTED NOT DETECTED Final   Streptococcus agalactiae NOT DETECTED NOT DETECTED Final   Streptococcus pneumoniae NOT DETECTED NOT DETECTED Final   Streptococcus pyogenes NOT DETECTED NOT DETECTED Final   A.calcoaceticus-baumannii NOT DETECTED NOT DETECTED Final   Bacteroides fragilis NOT DETECTED NOT DETECTED Final   Enterobacterales DETECTED (A) NOT DETECTED Final    Comment: Enterobacterales represent a large order of gram negative bacteria, not a single organism. CRITICAL RESULT CALLED TO, READ BACK BY AND VERIFIED WITH: JASON [email or web address]  09/08/21 RH    Enterobacter cloacae complex NOT DETECTED NOT DETECTED Final   Escherichia coli DETECTED (A) NOT DETECTED Final    Comment: CRITICAL RESULT CALLED TO, READ BACK BY AND VERIFIED WITH: JASON [email or web address]  09/08/21 RH    Klebsiella aerogenes NOT DETECTED NOT DETECTED Final   Klebsiella oxytoca NOT DETECTED NOT DETECTED Final   Klebsiella pneumoniae NOT DETECTED NOT DETECTED Final   Proteus species NOT DETECTED NOT DETECTED Final   Salmonella species NOT DETECTED NOT DETECTED Final   Serratia marcescens NOT  DETECTED NOT DETECTED Final   Haemophilus influenzae NOT DETECTED NOT DETECTED Final   Neisseria meningitidis NOT DETECTED NOT DETECTED Final   Pseudomonas aeruginosa NOT DETECTED NOT DETECTED Final   Stenotrophomonas maltophilia NOT DETECTED NOT DETECTED Final   Candida albicans NOT DETECTED NOT DETECTED Final   Candida auris NOT DETECTED NOT DETECTED Final   Candida glabrata NOT DETECTED NOT DETECTED Final   Candida krusei NOT DETECTED NOT DETECTED Final   Candida parapsilosis NOT DETECTED NOT DETECTED Final   Candida tropicalis NOT DETECTED NOT DETECTED Final   Cryptococcus neoformans/gattii NOT DETECTED NOT DETECTED Final   CTX-M ESBL NOT DETECTED NOT DETECTED Final   Carbapenem resistance IMP NOT DETECTED NOT DETECTED Final   Carbapenem resistance KPC NOT DETECTED NOT DETECTED Final   Carbapenem resistance NDM NOT DETECTED NOT DETECTED Final   Carbapenem resist OXA 48 LIKE NOT DETECTED NOT DETECTED Final   Carbapenem resistance VIM NOT DETECTED NOT DETECTED Final    Comment: Performed at [facility], [address] [place], [place] [address]  Resp Panel by RT-PCR (Flu A&B, Covid) Nasopharyngeal Swab     Status: None   Collection Time: 09/07/21  4:50  PM   Specimen: Nasopharyngeal Swab; Nasopharyngeal(NP) swabs in vial transport medium  Result Value Ref Range Status   SARS Coronavirus 2 by RT PCR NEGATIVE NEGATIVE Final    Comment: (NOTE) SARS-CoV-2 target nucleic acids are NOT DETECTED.  The SARS-CoV-2 RNA is generally detectable in upper respiratory specimens during the acute phase of infection. The lowest concentration of SARS-CoV-2 viral copies this assay can detect is 138 copies/mL. A negative result does not preclude SARS-Cov-2 infection and should not be used as the sole basis for treatment or other patient management decisions. A negative result may occur with  improper specimen collection/handling, submission of specimen other than nasopharyngeal swab,  presence of viral mutation(s) within the areas targeted by this assay, and inadequate number of viral copies(<138 copies/mL). A negative result must be combined with clinical observations, patient history, and epidemiological information. The expected result is Negative.  Fact Sheet for Patients:  EntrepreneurPulse.com.au  Fact Sheet for Healthcare Providers:  IncredibleEmployment.be  This test is no t yet approved or cleared by the [place] FDA and  has been authorized for detection and/or diagnosis of SARS-CoV-2 by FDA under an Emergency Use Authorization (EUA). This EUA will remain  in effect (meaning this test can be used) for the duration of the COVID-19 declaration under Section 564(b)(1) of the Act, 21 U.S.C.section 360bbb-3(b)(1), unless the authorization is terminated  or revoked sooner.       Influenza A by PCR NEGATIVE NEGATIVE Final   Influenza B by PCR NEGATIVE NEGATIVE Final    Comment: (NOTE) The Xpert Xpress SARS-CoV-2/FLU/RSV plus assay is intended as an aid in the diagnosis of influenza from Nasopharyngeal swab specimens and should not be used as a sole basis for treatment. Nasal washings and aspirates are unacceptable for Xpert Xpress SARS-CoV-2/FLU/RSV testing.  Fact Sheet for Patients: EntrepreneurPulse.com.au  Fact Sheet for Healthcare Providers: IncredibleEmployment.be  This test is not yet approved or cleared by the [place] FDA and has been authorized for detection and/or diagnosis of SARS-CoV-2 by FDA under an Emergency Use Authorization (EUA). This EUA will remain in effect (meaning this test can be used) for the duration of the COVID-19 declaration under Section 564(b)(1) of the Act, 21 U.S.C. section 360bbb-3(b)(1), unless the authorization is terminated or revoked.  Performed at [facility], [address] [place], [place] [address]   Blood culture  (routine x 2)     Status: None (Preliminary result)   Collection Time: 09/07/21  6:27 PM   Specimen: BLOOD  Result Value Ref Range Status   Specimen Description BLOOD RIGHT ANTECUBITAL  Final   Special Requests   Final    BOTTLES DRAWN AEROBIC AND ANAEROBIC Blood Culture results may not be optimal due to an excessive volume of blood received in culture bottles   Culture   Final    NO GROWTH 3 DAYS Performed at [facility], [address] [place], [place] [address]    Report Status PENDING  Incomplete   Disposition: Home  Discharge instruction: The patient was instructed to be ambulatory but told to refrain from heavy lifting, strenuous activity, or driving while on narcotic pain medications. Otherwise ok to resume normal activities.  Discharge medications:  Allergies as of 09/10/2021       Reactions   Pneumovax 23 [pneumococcal Vac Polyvalent] Swelling   Causes arm swelling, redness.   Prevnar 13 [pneumococcal 13-val Conj Vacc] Swelling        Medication List     TAKE these medications  Accu-Chek Aviva Plus test strip Generic drug: glucose blood Used to check blood sugars twice a day.   Accu-Chek Guide Me w/Device Kit USE AS DIRECTED   aspirin EC 81 MG tablet Take 81 mg by mouth daily.   atorvastatin 40 MG tablet Commonly known as: LIPITOR TAKE 1 TABLET BY MOUTH EVERY DAY   CENTRUM SILVER ADULT 50+ PO Take 1 tablet by mouth daily.   citalopram 20 MG tablet Commonly known as: CELEXA TAKE 1 TABLET BY MOUTH EVERY DAY   glipiZIDE 5 MG 24 hr tablet Commonly known as: GLUCOTROL XL Take 1 tablet (5 mg total) by mouth daily with breakfast.   lisinopril 5 MG tablet Commonly known as: ZESTRIL TAKE 1 TABLET BY MOUTH EVERY DAY   metFORMIN 1000 MG tablet Commonly known as: GLUCOPHAGE TAKE 1 TABLET (1,000 MG TOTAL) BY MOUTH 2 (TWO) TIMES DAILY WITH A MEAL.   ondansetron 4 MG disintegrating tablet Commonly known as: Zofran ODT Take 1 tablet (4 mg  total) by mouth every 8 (eight) hours as needed.   onetouch ultrasoft lancets Used to check blood sugars once a day.   Accu-Chek FastClix Lancets Misc Used to check blood sugars twice a day.   oxybutynin 5 MG tablet Commonly known as: DITROPAN Take 1 tablet (5 mg total) by mouth every 8 (eight) hours as needed for bladder spasms.   oxyCODONE-acetaminophen 5-325 MG tablet Commonly known as: Percocet Take 1 tablet by mouth every 4 (four) hours as needed.   Ozempic (0.25 or 0.5 MG/DOSE) 2 MG/1.5ML Sopn Generic drug: Semaglutide(0.25 or 0.5MG/DOS) Inject 0.5 mg into the skin once a week.   pantoprazole 20 MG tablet Commonly known as: PROTONIX Take 1 tablet (20 mg total) by mouth 2 (two) times daily before a meal.   Pen Needles 32G X 4 MM Misc Used to give Victoza injections.   sulfamethoxazole-trimethoprim 800-160 MG tablet Commonly known as: BACTRIM DS Take 1 tablet by mouth 2 (two) times daily for 14 days.   tamsulosin 0.4 MG Caps capsule Commonly known as: FLOMAX Take 1 capsule (0.4 mg total) by mouth daily. Start taking on: September 11, 2021   VIACTIV CALCIUM PLUS D PO Take 1 tablet by mouth daily.        Followup:   Follow-up Information     Hollice Espy, MD. Go in 2 week(s).   Specialty: Urology Why: for follow-up ureteroscopy with laser lithotripsy and stent exchange. Our surgical scheduler will call you with further details. Contact information: [place] [address] [phone]

## 2021-09-11 ENCOUNTER — Other Ambulatory Visit: Payer: Self-pay | Admitting: Family

## 2021-09-11 DIAGNOSIS — R110 Nausea: Secondary | ICD-10-CM

## 2021-09-11 MED ORDER — ONDANSETRON 4 MG PO TBDP: 4.0000 mg | ORAL_TABLET | Freq: Three times a day (TID) | ORAL | 1 refills | Status: DC | PRN

## 2021-09-12 LAB — CULTURE, BLOOD (ROUTINE X 2): Culture: NO GROWTH

## 2021-09-14 ENCOUNTER — Telehealth: Payer: Self-pay

## 2021-09-14 NOTE — Telephone Encounter (Signed)
Transition Care Management Follow-up Telephone Call Date of discharge and from where: 09/10/2021 How have you been since you were released from the hospital? "Doing alright" Any questions or concerns? No  Items Reviewed: Did the pt receive and understand the discharge instructions provided? Yes  Medications obtained and verified? Yes  Other? No  Any new allergies since your discharge? No  Dietary orders reviewed? Yes Do you have support at home? Yes   Home Care and Equipment/Supplies: Were home health services ordered? no If so, what is the name of the agency?  Has the agency set up a time to come to the patient's home?  Were any new equipment or medical supplies ordered?  No What is the name of the medical supply agency?  Were you able to get the supplies/equipment? not applicable Do you have any questions related to the use of the equipment or supplies? No  Functional Questionnaire: (I = Independent and D = Dependent) ADLs: I  Bathing/Dressing- I  Meal Prep- I  Eating- I  Maintaining continence- I  Transferring/Ambulation- I  Managing Meds- I  Follow up appointments reviewed:  PCP Hospital f/u appt confirmed? No   Specialist Hospital f/u appt confirmed? Yes  Scheduled to see Urology  on 1024/2022  Are transportation arrangements needed? No  If their condition worsens, is the pt aware to call PCP or go to the Emergency Dept.? Yes Was the patient provided with contact information for the PCP's office or ED? Yes Was to pt encouraged to call back with questions or concerns? Yes  Rowe Pavy, RN, BSN, CEN [facility] [phone]

## 2021-09-17 ENCOUNTER — Encounter
Admission: RE | Admit: 2021-09-17 | Discharge: 2021-09-17 | Disposition: A | Discharge status: Home / Self Care | Payer: Medicare HMO | Source: Ambulatory Visit | Attending: Urology | Admitting: Urology

## 2021-09-17 ENCOUNTER — Other Ambulatory Visit: Payer: Self-pay

## 2021-09-17 VITALS — Ht 72.0 in | Wt 204.0 lb

## 2021-09-17 DIAGNOSIS — N201 Calculus of ureter: Secondary | ICD-10-CM

## 2021-09-17 DIAGNOSIS — I10 Essential (primary) hypertension: Secondary | ICD-10-CM

## 2021-09-17 DIAGNOSIS — D510 Vitamin B12 deficiency anemia due to intrinsic factor deficiency: Secondary | ICD-10-CM

## 2021-09-17 DIAGNOSIS — E119 Type 2 diabetes mellitus without complications: Secondary | ICD-10-CM

## 2021-09-17 HISTORY — DX: Vitamin B12 deficiency anemia due to intrinsic factor deficiency: D51.0

## 2021-09-17 HISTORY — DX: Gastro-esophageal reflux disease without esophagitis: K21.9

## 2021-09-17 NOTE — Patient Instructions (Signed)
Your procedure is scheduled on: 09/21/21 Report to DAY SURGERY DEPARTMENT LOCATED ON 2ND FLOOR MEDICAL MALL ENTRANCE. To find out your arrival time please call [phone] between 1PM - 3PM on 09/20/21.  Remember: Instructions that are not followed completely may result in serious medical risk, up to and including death, or upon the discretion of your surgeon and anesthesiologist your surgery may need to be rescheduled.     _X__ 1. Do not eat food or drink any liquids after midnight the night before your procedure.                 No gum chewing or hard candies.   __X__2.  On the morning of surgery brush your teeth with toothpaste and water, you                 may rinse your mouth with mouthwash if you wish.  Do not swallow any              toothpaste of mouthwash.     _X__ 3.  No Alcohol for 24 hours before or after surgery.   _X__ 4.  Do Not Smoke or use e-cigarettes For 24 Hours Prior to Your Surgery.                 Do not use any chewable tobacco products for at least 6 hours prior to                 surgery.  ____  5.  Bring all medications with you on the day of surgery if instructed.   __X__  6.  Notify your doctor if there is any change in your medical condition      (cold, fever, infections).     Do not wear jewelry, make-up, hairpins, clips or nail polish. Do not wear lotions, powders, or perfumes.  Do not shave 48 hours prior to surgery. Men may shave face and neck. Do not bring valuables to the hospital.    [facility] is not responsible for any belongings or valuables.  Contacts, dentures/partials or body piercings may not be worn into surgery. Bring a case for your contacts, glasses or hearing aids, a denture cup will be supplied. Leave your suitcase in the car. After surgery it may be brought to your room. For patients admitted to the hospital, discharge time is determined by your treatment team.   Patients discharged the day of surgery will not be allowed to  drive home.   Please read over the following fact sheets that you were given:     __X__ Take these medicines the morning of surgery with A SIP OF WATER:    1. atorvastatin (LIPITOR) 40 MG tablet  2. citalopram (CELEXA) 20 MG tablet  3. oxybutynin (DITROPAN) 5 MG tablet  4. pantoprazole (PROTONIX) 20 MG tablet  5. tamsulosin (FLOMAX) 0.4 MG CAPS capsule  6.  ____ Fleet Enema (as directed)   ____ Use CHG Soap/SAGE wipes as directed  ____ Use inhalers on the day of surgery  __X__ Stop metformin/Janumet/Farxiga 2 days prior to surgery  LAST DOSE Saturday MORNING  ____ Take 1/2 of usual insulin dose the night before surgery. No insulin the morning          of surgery.   ____ Stop Blood Thinners Coumadin/Plavix/Xarelto/Pleta/Pradaxa/Eliquis/Effient/Aspirin  on   Or contact your Surgeon, Cardiologist or Medical Doctor regarding  ability to stop your blood thinners  __X__ Stop Anti-inflammatories 7 days before surgery such as  Advil, Ibuprofen, Motrin,  BC or Goodies Powder, Naprosyn, Naproxen, Aleve,    Hold aspirin starting today 09/17/21  __X__ Stop all herbal supplements, fish oil or vitamin E until after surgery.    ____ Bring C-Pap to the hospital.

## 2021-09-21 ENCOUNTER — Ambulatory Visit: Payer: Medicare HMO

## 2021-09-21 ENCOUNTER — Ambulatory Visit
Admission: RE | Admit: 2021-09-21 | Discharge: 2021-09-21 | Disposition: A | Discharge status: Home / Self Care | Payer: Medicare HMO | Source: Ambulatory Visit | Attending: Urology | Admitting: Urology

## 2021-09-21 ENCOUNTER — Encounter
Admission: RE | Disposition: A | Discharge status: Home / Self Care | Payer: Self-pay | Source: Ambulatory Visit | Attending: Urology

## 2021-09-21 ENCOUNTER — Encounter: Payer: Self-pay | Admitting: Urology

## 2021-09-21 ENCOUNTER — Encounter: Payer: Self-pay | Admitting: Family

## 2021-09-21 ENCOUNTER — Other Ambulatory Visit: Payer: Self-pay

## 2021-09-21 ENCOUNTER — Encounter: Payer: Self-pay | Admitting: Gastroenterology

## 2021-09-21 DIAGNOSIS — N201 Calculus of ureter: Secondary | ICD-10-CM | POA: Insufficient documentation

## 2021-09-21 DIAGNOSIS — Z8616 Personal history of COVID-19: Secondary | ICD-10-CM | POA: Insufficient documentation

## 2021-09-21 DIAGNOSIS — E119 Type 2 diabetes mellitus without complications: Secondary | ICD-10-CM | POA: Diagnosis not present

## 2021-09-21 DIAGNOSIS — Z8619 Personal history of other infectious and parasitic diseases: Secondary | ICD-10-CM | POA: Diagnosis not present

## 2021-09-21 DIAGNOSIS — A419 Sepsis, unspecified organism: Secondary | ICD-10-CM

## 2021-09-21 DIAGNOSIS — Z887 Allergy status to serum and vaccine status: Secondary | ICD-10-CM | POA: Diagnosis not present

## 2021-09-21 HISTORY — PX: CYSTOSCOPY/RETROGRADE/URETEROSCOPY/STONE EXTRACTION WITH BASKET: SHX5317

## 2021-09-21 LAB — GLUCOSE, CAPILLARY
Glucose-Capillary: 167 mg/dL — ABNORMAL HIGH (ref 70–99)
Glucose-Capillary: 159 mg/dL — ABNORMAL HIGH (ref 70–99)

## 2021-09-21 LAB — CBC
HCT: 31.1 % — ABNORMAL LOW (ref 36.0–46.0)
Hemoglobin: 10.2 g/dL — ABNORMAL LOW (ref 12.0–15.0)
MCH: 28.1 pg (ref 26.0–34.0)
MCHC: 32.8 g/dL (ref 30.0–36.0)
MCV: 85.7 fL (ref 80.0–100.0)
Platelets: 356 10*3/uL (ref 150–400)
RBC: 3.63 MIL/uL — ABNORMAL LOW (ref 3.87–5.11)
RDW: 13.7 % (ref 11.5–15.5)
WBC: 5.7 10*3/uL (ref 4.0–10.5)
nRBC: 0 % (ref 0.0–0.2)

## 2021-09-21 SURGERY — CYSTOSCOPY, WITH CALCULUS REMOVAL USING BASKET
Anesthesia: General | Laterality: Right

## 2021-09-21 MED ORDER — CHLORHEXIDINE GLUCONATE 0.12 % MT SOLN
OROMUCOSAL | Status: AC
  Filled 2021-09-21: qty 15

## 2021-09-21 MED ORDER — CEFAZOLIN SODIUM-DEXTROSE 1-4 GM/50ML-% IV SOLN
1.0000 g | INTRAVENOUS | Status: AC
  Administered 2021-09-21: 2 g via INTRAVENOUS

## 2021-09-21 MED ORDER — PHENYLEPHRINE HCL (PRESSORS) 10 MG/ML IV SOLN
INTRAVENOUS | Status: DC | PRN
  Administered 2021-09-21: 80 ug via INTRAVENOUS

## 2021-09-21 MED ORDER — PROPOFOL 10 MG/ML IV BOLUS
INTRAVENOUS | Status: DC | PRN
  Administered 2021-09-21: 100 mg via INTRAVENOUS
  Administered 2021-09-21: 40 mg via INTRAVENOUS
  Administered 2021-09-21: 60 mg via INTRAVENOUS

## 2021-09-21 MED ORDER — CEFAZOLIN SODIUM-DEXTROSE 1-4 GM/50ML-% IV SOLN
INTRAVENOUS | Status: AC
  Filled 2021-09-21: qty 50

## 2021-09-21 MED ORDER — MIDAZOLAM HCL 2 MG/2ML IJ SOLN
INTRAMUSCULAR | Status: AC
  Filled 2021-09-21: qty 2

## 2021-09-21 MED ORDER — DEXAMETHASONE SODIUM PHOSPHATE 10 MG/ML IJ SOLN
INTRAMUSCULAR | Status: DC | PRN
  Administered 2021-09-21: 10 mg via INTRAVENOUS

## 2021-09-21 MED ORDER — FENTANYL CITRATE (PF) 100 MCG/2ML IJ SOLN
INTRAMUSCULAR | Status: AC
  Filled 2021-09-21: qty 2

## 2021-09-21 MED ORDER — SODIUM CHLORIDE 0.9 % IV SOLN: INTRAVENOUS | Status: DC

## 2021-09-21 MED ORDER — FENTANYL CITRATE (PF) 100 MCG/2ML IJ SOLN: 25.0000 ug | INTRAMUSCULAR | Status: DC | PRN

## 2021-09-21 MED ORDER — FENTANYL CITRATE (PF) 100 MCG/2ML IJ SOLN
INTRAMUSCULAR | Status: DC | PRN
  Administered 2021-09-21 (×4): 25 ug via INTRAVENOUS

## 2021-09-21 MED ORDER — ONDANSETRON HCL 4 MG/2ML IJ SOLN
INTRAMUSCULAR | Status: DC | PRN
  Administered 2021-09-21: 4 mg via INTRAVENOUS

## 2021-09-21 MED ORDER — MIDAZOLAM HCL 2 MG/2ML IJ SOLN
INTRAMUSCULAR | Status: DC | PRN
  Administered 2021-09-21: 1 mg via INTRAVENOUS

## 2021-09-21 MED ORDER — LIDOCAINE HCL (CARDIAC) PF 100 MG/5ML IV SOSY
PREFILLED_SYRINGE | INTRAVENOUS | Status: DC | PRN
  Administered 2021-09-21: 100 mg via INTRAVENOUS

## 2021-09-21 MED ORDER — CHLORHEXIDINE GLUCONATE 0.12 % MT SOLN
15.0000 mL | Freq: Once | OROMUCOSAL | Status: AC
  Administered 2021-09-21: 15 mL via OROMUCOSAL

## 2021-09-21 MED ORDER — OXYCODONE HCL 5 MG PO TABS: 5.0000 mg | ORAL_TABLET | Freq: Once | ORAL | Status: DC | PRN

## 2021-09-21 MED ORDER — OXYCODONE HCL 5 MG/5ML PO SOLN: 5.0000 mg | Freq: Once | ORAL | Status: DC | PRN

## 2021-09-21 MED ORDER — SODIUM CHLORIDE 0.9 % IR SOLN
Status: DC | PRN
  Administered 2021-09-21: 1000 mL

## 2021-09-21 MED ORDER — ORAL CARE MOUTH RINSE: 15.0000 mL | Freq: Once | OROMUCOSAL | Status: AC

## 2021-09-21 SURGICAL SUPPLY — 26 items
BAG DRAIN CYSTO-URO LG1000N (MISCELLANEOUS) ×3 IMPLANT
BASKET ZERO TIP 1.9FR (BASKET) ×3 IMPLANT
BRUSH SCRUB EZ 1% IODOPHOR (MISCELLANEOUS) ×3 IMPLANT
CATH URET FLEX-TIP 2 LUMEN 10F (CATHETERS) IMPLANT
CATH URETL OPEN 5X70 (CATHETERS) ×3 IMPLANT
CNTNR SPEC 2.5X3XGRAD LEK (MISCELLANEOUS) ×2
CONT SPEC 4OZ STER OR WHT (MISCELLANEOUS) ×1
CONTAINER SPEC 2.5X3XGRAD LEK (MISCELLANEOUS) ×2 IMPLANT
DRAPE UTILITY 15X26 TOWEL STRL (DRAPES) ×3 IMPLANT
GAUZE 4X4 16PLY ~~LOC~~+RFID DBL (SPONGE) ×6 IMPLANT
GLOVE SURG ENC MOIS LTX SZ6.5 (GLOVE) ×3 IMPLANT
GOWN STRL REUS W/ TWL LRG LVL3 (GOWN DISPOSABLE) ×4 IMPLANT
GOWN STRL REUS W/TWL LRG LVL3 (GOWN DISPOSABLE) ×2
GUIDEWIRE GREEN .038 145CM (MISCELLANEOUS) IMPLANT
GUIDEWIRE STR DUAL SENSOR (WIRE) ×3 IMPLANT
INFUSOR MANOMETER BAG 3000ML (MISCELLANEOUS) ×3 IMPLANT
IV NS IRRIG 3000ML ARTHROMATIC (IV SOLUTION) ×3 IMPLANT
KIT TURNOVER CYSTO (KITS) ×3 IMPLANT
MANIFOLD NEPTUNE II (INSTRUMENTS) ×3 IMPLANT
PACK CYSTO AR (MISCELLANEOUS) ×3 IMPLANT
SET CYSTO W/LG BORE CLAMP LF (SET/KITS/TRAYS/PACK) ×3 IMPLANT
SHEATH URETERAL 12FRX35CM (MISCELLANEOUS) IMPLANT
STENT URET 6FRX24 CONTOUR (STENTS) ×3 IMPLANT
SURGILUBE 2OZ TUBE FLIPTOP (MISCELLANEOUS) ×3 IMPLANT
WATER STERILE IRR 1000ML POUR (IV SOLUTION) ×3 IMPLANT
WATER STERILE IRR 500ML POUR (IV SOLUTION) ×3 IMPLANT

## 2021-09-21 NOTE — Anesthesia Postprocedure Evaluation (Signed)
Anesthesia Post Note  Patient: JEYMI HEPP  Procedure(s) Performed: CYSTOSCOPY/URETEROSCOPY/HOLMIUM LASER/STENT EXCHANGE (Right)  Patient location during evaluation: PACU Anesthesia Type: General Level of consciousness: awake and alert Pain management: pain level controlled Vital Signs Assessment: post-procedure vital signs reviewed and stable Respiratory status: spontaneous breathing, nonlabored ventilation and respiratory function stable Cardiovascular status: blood pressure returned to baseline and stable Postop Assessment: no apparent nausea or vomiting Anesthetic complications: no   No notable events documented.   Last Vitals:  Vitals:   09/21/21 0945 09/21/21 0959  BP: 111/68 122/69  Pulse: 60 (!) 58  Resp: 16 18  Temp: 36.4 C 36.7 C  SpO2: 97% 99%    Last Pain:  Vitals:   09/21/21 0959  TempSrc: Temporal  PainSc:                  Foye Deer

## 2021-09-21 NOTE — Anesthesia Preprocedure Evaluation (Signed)
Anesthesia Evaluation  Patient identified by MRN, date of birth, ID band Patient awake    Reviewed: Allergy & Precautions, NPO status , Patient's Chart, lab work & pertinent test results  History of Anesthesia Complications Negative for: history of anesthetic complications  Airway Mallampati: III  TM Distance: <3 FB Neck ROM: full    Dental  (+) Chipped   Pulmonary neg pulmonary ROS, neg shortness of breath,    Pulmonary exam normal        Cardiovascular Exercise Tolerance: Good hypertension, (-) anginaNormal cardiovascular exam     Neuro/Psych PSYCHIATRIC DISORDERS negative neurological ROS  negative psych ROS   GI/Hepatic negative GI ROS, Neg liver ROS, GERD  Medicated and Controlled,  Endo/Other  diabetes, Type 2  Renal/GU Renal disease     Musculoskeletal   Abdominal   Peds  Hematology negative hematology ROS (+)   Anesthesia Other Findings Past Medical History: No date: COVID-19     Comment:  05/27/21 No date: Depression No date: Diabetes mellitus without complication (HCC)     Comment:  Type II No date: Eosinophilic esophagitis 2019: Esophageal stricture No date: GERD (gastroesophageal reflux disease) No date: Hyperlipidemia No date: Pernicious anemia  Past Surgical History: 2006: ABDOMINAL HYSTERECTOMY     Comment:  has ovaries. Had heavy periods and was anemic. HAS               cervix 2009: BREAST EXCISIONAL BIOPSY; Right     Comment:  phyllodes removed  No date: CESAREAN SECTION No date: CHOLECYSTECTOMY OPEN 02/02/2021: COLONOSCOPY WITH PROPOFOL; N/A     Comment:  Procedure: COLONOSCOPY WITH PROPOFOL;  Surgeon: Toney Reil, MD;  Location: [facility]COPY;  Service:               Gastroenterology;  Laterality: N/A; 09/07/2021: CYSTOSCOPY WITH STENT PLACEMENT; Right     Comment:  Procedure: CYSTOSCOPY WITH STENT PLACEMENT; WITH               RETROGRADE PYELOGRAM;   Surgeon: [name], MD;                Location: [facility] ORS;  Service: Urology;  Laterality:               Right; 1966: TONSILLECTOMY     Reproductive/Obstetrics negative OB ROS                             Anesthesia Physical Anesthesia Plan  ASA: 3  Anesthesia Plan: General ETT   Post-op Pain Management:    Induction: Intravenous  PONV Risk Score and Plan: Ondansetron, Dexamethasone, Midazolam and Treatment may vary due to age or medical condition  Airway Management Planned: Oral ETT  Additional Equipment:   Intra-op Plan:   Post-operative Plan: Extubation in OR  Informed Consent: I have reviewed the patients History and Physical, chart, labs and discussed the procedure including the risks, benefits and alternatives for the proposed anesthesia with the patient or authorized representative who has indicated his/her understanding and acceptance.     Dental Advisory Given  Plan Discussed with: Anesthesiologist, CRNA and Surgeon  Anesthesia Plan Comments: (Patient consented for risks of anesthesia including but not limited to:  - adverse reactions to medications - damage to eyes, teeth, lips or other oral mucosa - nerve damage due to positioning  - sore throat or hoarseness - Damage  to heart, brain, nerves, lungs, other parts of body or loss of life  Patient voiced understanding.)        Anesthesia Quick Evaluation

## 2021-09-21 NOTE — Anesthesia Procedure Notes (Signed)
Procedure Name: LMA Insertion Date/Time: 09/21/2021 8:53 AM Performed by: Rodney Booze, CRNA Pre-anesthesia Checklist: Patient identified, Emergency Drugs available, Suction available, Patient being monitored and Timeout performed Patient Re-evaluated:Patient Re-evaluated prior to induction Oxygen Delivery Method: Circle system utilized Preoxygenation: Pre-oxygenation with 100% oxygen Induction Type: IV induction Ventilation: Mask ventilation without difficulty LMA: LMA inserted LMA Size: 5.0 Number of attempts: 1 Tube secured with: Tape Dental Injury: Teeth and Oropharynx as per pre-operative assessment

## 2021-09-21 NOTE — Progress Notes (Signed)
Patient awake and alert. Verbalizes understanding of plan of care:  removal of stent in am. Reviewed medications with patient regarding bladder spasms, verbalizes understanding. Patient states "I'm ready to go"

## 2021-09-21 NOTE — Discharge Instructions (Addendum)
You have a ureteral stent in place.  This is a tube that extends from your kidney to your bladder.  This may cause urinary bleeding, burning with urination, and urinary frequency.  Please call our office or present to the ED if you develop fevers >101 or pain which is not able to be controlled with oral pain medications.  You may be given either Flomax and/ or ditropan to help with bladder spasms and stent pain in addition to pain medications.    Your stent is attached to a string.  This is taped to your left inner thigh.  Tomorrow morning, you may untape and pull the stent string until the entire stent is removed.  If you have any difficulty or issues, feel free to contact our office for assistance.  [facility] [address] [place], [place] [address] [phone] AMBULATORY SURGERY  DISCHARGE INSTRUCTIONS   The drugs that you were given will stay in your system until tomorrow so for the next 24 hours you should not:  Drive an automobile Make any legal decisions Drink any alcoholic beverage   You may resume regular meals tomorrow.  Today it is better to start with liquids and gradually work up to solid foods.  You may eat anything you prefer, but it is better to start with liquids, then soup and crackers, and gradually work up to solid foods.   Please notify your doctor immediately if you have any unusual bleeding, trouble breathing, redness and pain at the surgery site, drainage, fever, or pain not relieved by medication.    Your post-operative visit with Dr.                                       is: Date:                        Time:    Please call to schedule your post-operative visit.  Additional Instructions:

## 2021-09-21 NOTE — Op Note (Signed)
Date of procedure: 09/21/21  Preoperative diagnosis:  Right distal ureteral calculus History of sepsis of urinary source/bacteremia  Postoperative diagnosis:  Same as above  Procedure: Right ureteroscopy Basket extraction of right ureteral stone Right retrograde pyelogram Right ureteral stent exchange Interpretation of fluoroscopy less than 30 minutes  Surgeon: [name], MD  Anesthesia: General  Complications: None  Intraoperative findings: 3 mm right distal ureteral stone identified, able to be extracted via basket without need for laser ablation no additional stone fragment.  Stent replaced on tether.  EBL: Minimal  Specimens: Stone fragment  Drains: 6 x 24 French double-J ureteral stent on right with tether  Indication: Danielle Dickerson is a 65 y.o. patient with 3 mm left distal ureteral calculus who returns today for definitive management of her obstructing stone after requiring emergent ureteral stent placement in the setting of sepsis of urinary source.  After reviewing the management options for treatment, she elected to proceed with the above surgical procedure(s). We have discussed the potential benefits and risks of the procedure, side effects of the proposed treatment, the likelihood of the patient achieving the goals of the procedure, and any potential problems that might occur during the procedure or recuperation. Informed consent has been obtained.  Description of procedure:  The patient was taken to the operating room and general anesthesia was induced.  The patient was placed in the dorsal lithotomy position, prepped and draped in the usual sterile fashion, and preoperative antibiotics were administered. A preoperative time-out was performed.   A 21 French scope was advanced per urethra into the bladder.  Attention was turned to the right ureteral orifice from which a ureteral stent was seen emanating.  The distal coil the stent was grasped and brought to the  level of the urethral meatus.  A wire was then cannulated through the stent up to the level of the kidney and the stent was removed.  The wire remained in place as a safety wire.  A semirigid ureteroscope was then advanced alongside the wire into the distal ureter where the stone was identified.  It was relatively small and as such, a 1.9 tipless nitinol basket was used to remove the stone en bloc.  No additional fragmentation was needed.  The scope was then advanced all the way up to level the proximal ureter no additional stone fragments were identified.  There were no ureteral injuries, significant edema, or any other concerning findings.  Contrast was injected through the scope to create a retrograde pyelogram which showed a decompressed upper tract collecting system with no contrast extravasation.  Finally, a 6 x 24 French double-J ureteral stent was advanced over the wire up to the level of the kidney.  The wire was partially drawn till full coils noted both within the renal pelvis as well as within the bladder.  The bladder was then drained.  The stent string was affixed to the patient's right inner thigh using Mastisol and Tegaderm.  She was then repositioned in the supine position, reversed of anesthesia, and taken the PACU in stable condition.  Plan: Given minimal trauma today with school being an overall excellent appearance of her ureter, I like to leave the stent just overnight and she can remove it in the morning.  I will have her follow-up in 4 weeks with renal ultrasound prior.  [name], M.D.

## 2021-09-21 NOTE — Interval H&P Note (Signed)
History and Physical Interval Note:  09/21/2021 8:29 AM  Danielle Dickerson  has presented today for surgery, with the diagnosis of Right Ureteral Stone.  The various methods of treatment have been discussed with the patient and family. After consideration of risks, benefits and other options for treatment, the patient has consented to  Procedure(s): CYSTOSCOPY/URETEROSCOPY/HOLMIUM LASER/STENT EXCHANGE (Right) as a surgical intervention.  The patient's history has been reviewed, patient examined, no change in status, stable for surgery.  I have reviewed the patient's chart and labs.  Questions were answered to the patient's satisfaction.    RRR CTAB  Returns today for definitive management of her stone.  Remains in abx currently.  Risks and benefits of ureteroscopy were reviewed including but not limited to infection, bleeding, pain, ureteral injury which could require open surgery versus prolonged indwelling if ureteral perforation occurs, persistent stone disease, requirement for staged procedure, possible stent, and global anesthesia risks. Patient expressed understanding and desires to proceed with ureteroscopy.   [name]

## 2021-09-21 NOTE — Transfer of Care (Signed)
Immediate Anesthesia Transfer of Care Note  Patient: Danielle Dickerson  Procedure(s) Performed: CYSTOSCOPY/URETEROSCOPY/HOLMIUM LASER/STENT EXCHANGE (Right)  Patient Location: PACU  Anesthesia Type:General  Level of Consciousness: drowsy  Airway & Oxygen Therapy: Patient Spontanous Breathing and Patient connected to face mask oxygen  Post-op Assessment: Report given to RN and Post -op Vital signs reviewed and stable  Post vital signs: stable  Last Vitals:  Vitals Value Taken Time  BP 97/62 09/21/21 0916  Temp    Pulse 65 09/21/21 0918  Resp 14 09/21/21 0918  SpO2 97 % 09/21/21 0918  Vitals shown include unvalidated device data.  Last Pain:  Vitals:   09/21/21 0804  TempSrc: Oral  PainSc: 0-No pain         Complications: No notable events documented.

## 2021-09-22 ENCOUNTER — Telehealth: Payer: Self-pay

## 2021-09-22 ENCOUNTER — Other Ambulatory Visit: Payer: Self-pay | Admitting: Family

## 2021-09-22 ENCOUNTER — Encounter: Payer: Self-pay | Admitting: Urology

## 2021-09-22 ENCOUNTER — Other Ambulatory Visit: Payer: Self-pay

## 2021-09-22 DIAGNOSIS — N201 Calculus of ureter: Secondary | ICD-10-CM

## 2021-09-22 DIAGNOSIS — K219 Gastro-esophageal reflux disease without esophagitis: Secondary | ICD-10-CM

## 2021-09-22 NOTE — Telephone Encounter (Signed)
Spoke with patient she is good with going ahead with procedure per dr Allegra Lai

## 2021-09-24 ENCOUNTER — Other Ambulatory Visit: Payer: Self-pay

## 2021-09-24 ENCOUNTER — Ambulatory Visit (INDEPENDENT_AMBULATORY_CARE_PROVIDER_SITE_OTHER): Payer: Medicare HMO

## 2021-09-24 DIAGNOSIS — E538 Deficiency of other specified B group vitamins: Secondary | ICD-10-CM

## 2021-09-24 LAB — CALCULI, WITH PHOTOGRAPH (CLINICAL LAB)
Calcium Oxalate Monohydrate: 100 %
Weight Calculi: 9 mg

## 2021-09-24 MED ORDER — CYANOCOBALAMIN 1000 MCG/ML IJ SOLN
1000.0000 ug | Freq: Once | INTRAMUSCULAR | Status: AC
  Administered 2021-09-24: 1000 ug via INTRAMUSCULAR

## 2021-09-24 NOTE — Progress Notes (Signed)
Patient presented for B 12 injection to right deltoid, patient voiced no concerns nor showed any signs of distress during injection. 

## 2021-09-29 ENCOUNTER — Encounter: Payer: Self-pay | Admitting: Family

## 2021-09-29 ENCOUNTER — Other Ambulatory Visit: Payer: Self-pay

## 2021-09-29 ENCOUNTER — Ambulatory Visit (INDEPENDENT_AMBULATORY_CARE_PROVIDER_SITE_OTHER): Payer: Medicare HMO | Admitting: Family

## 2021-09-29 VITALS — BP 102/66 | HR 88 | Temp 97.8°F | Ht 72.0 in | Wt 197.6 lb

## 2021-09-29 DIAGNOSIS — R5383 Other fatigue: Secondary | ICD-10-CM

## 2021-09-29 DIAGNOSIS — E119 Type 2 diabetes mellitus without complications: Secondary | ICD-10-CM | POA: Diagnosis not present

## 2021-09-29 LAB — MICROALBUMIN / CREATININE URINE RATIO
Creatinine,U: 187.3 mg/dL
Microalb Creat Ratio: 1.6 mg/g (ref 0.0–30.0)
Microalb, Ur: 2.9 mg/dL — ABNORMAL HIGH (ref 0.0–1.9)

## 2021-09-29 NOTE — Patient Instructions (Addendum)
Stop glipizide and lisinopril based on your  low A1c and low end blood pressure today, I think these are contributing to fatigue  Please purchase over-the-counter iron called, ferrous sulfate, which contains 65 mg of elemental iron per tablet. You may take 1-2 tablets per day. Please monitor for constipation and you may want to start a stool softener such as colace when you start iron. Also, best to take iron supplement with a small glass of orange juice for absorption.   Always a pleasure seeing you.  Please let me know if any new concerns or certainly if fatigue does not improve.  We will follow-up in a couple weeks

## 2021-09-29 NOTE — Progress Notes (Signed)
Subjective:    Patient ID: Danielle Dickerson, female    DOB: August 06, 1956, 65 y.o.   MRN: [id number]  CC: Danielle Dickerson is a 65 y.o. female who presents today for follow up.   HPI:  Complains of fatigue, unchanged since hospitalization 7 days ago. She is able to ride on the peloton for 15 minutes and walk her dog around neighborhood. She is sleeping well. Taking melatonin gummy. She doesn't feel restored from sleep.Denies snoring. Appetite has improved.  No improvement of energy with exercise.  NO cp, sob, left arm numbness   Prior to hospitalization, she would was able to exercise, such as ride Peloton, for 45 minutes.    Anemia- she is taking general multivitamin. Denies rectal bleeding, blood in stool   Nausea has completely resolved. No further vomiting.    DM-reduced Ozempic to 0.5 mg however [facility] has given her wrong rx of 3m.  She has been using 177mfor the past 2 weeks.  Compliant with glipizide 67m1m metformin 1000 mg twice daily. A1c 3 weeks ago 6.2  GERD-compliant with Protonix 20 mg BID, Pepcid 20 mg qhs.  Pernicious anemia- she is compliant with b12 injection. B12 376.   HTN-compliant with lisinopril 5 mg.  She is no longer on amlodipine  Cystoscopy  with Dr. BraErlene Quanr right ureteral stone 09/21/21  Consult with GI, Dr. VanMarius Ditch/03/2019 for dyspepsia and pernicious anemia.  Pending EGD, she is also considering gastric emptying study.  Screening colonoscopy 2027  EGD planned for 10/07/21  Mammogram UTD  HISTORY:  Past Medical History:  Diagnosis Date  . COVID-19    05/27/21  . Depression   . Diabetes mellitus without complication ([facility]  Type II  . Eosinophilic esophagitis   . Esophageal stricture 2019  . GERD (gastroesophageal reflux disease)   . Hyperlipidemia   . Pernicious anemia    Past Surgical History:  Procedure Laterality Date  . ABDOMINAL HYSTERECTOMY  2006   has ovaries. Had heavy periods and was anemic. HAS cervix  . BREAST EXCISIONAL BIOPSY  Right 2009   phyllodes removed   . CESAREAN SECTION    . CHOLECYSTECTOMY OPEN    . COLONOSCOPY WITH PROPOFOL N/A 02/02/2021   Procedure: COLONOSCOPY WITH PROPOFOL;  Surgeon: VanLin LandsmanD;  [facility];  Service: Gastroenterology;  Laterality: N/A;  . CYSTOSCOPY WITH STENT PLACEMENT Right 09/07/2021   Procedure: CYSTOSCOPY WITH STENT PLACEMENT; WITH RETROGRADE PYELOGRAM;  Surgeon: BraHollice EspyD;  Location: [facility] ORS;  Service: Urology;  Laterality: Right;  . CYSTOSCOPY/RETROGRADE/URETEROSCOPY/STONE EXTRACTION WITH BASKET  09/21/2021   Procedure: CYSTOSCOPY/RETROGRADE/URETEROSCOPY/STONE EXTRACTION WITH BASKET;  Surgeon: BraHollice EspyD;  Location: [facility] ORS;  Service: Urology;;  . TONSILLECTOMY  1966   Family History  Problem Relation Age of Onset  . Congestive Heart Failure Mother   . Colon cancer Father   . Esophageal cancer Maternal Uncle   . Thyroid cancer Neg Hx     Allergies: Pneumovax 23 [pneumococcal vac polyvalent] and Prevnar 13 [pneumococcal 13-val conj vacc] Current Outpatient Medications on File Prior to Visit  Medication Sig Dispense Refill  . Accu-Chek FastClix Lancets MISC Used to check blood sugars twice a day. 100 each 3  . aspirin EC 81 MG tablet Take 81 mg by mouth daily.    . aMarland Kitchenorvastatin (LIPITOR) 40 MG tablet TAKE 1 TABLET BY MOUTH EVERY DAY 90 tablet 1  . Blood Glucose Monitoring Suppl (ACCU-CHEK GUIDE ME) w/Device KIT USE AS DIRECTED 1  kit 0  . Calcium-Vitamin D-Vitamin K (VIACTIV CALCIUM PLUS D PO) Take 1 tablet by mouth daily.    . citalopram (CELEXA) 20 MG tablet TAKE 1 TABLET BY MOUTH EVERY DAY 90 tablet 2  . famotidine (PEPCID) 20 MG tablet Take 20 mg by mouth at bedtime.    Marland Kitchen glucose blood (ACCU-CHEK AVIVA PLUS) test strip Used to check blood sugars twice a day. 100 each 5  . Insulin Pen Needle (PEN NEEDLES) 32G X 4 MM MISC Used to give Victoza injections. 100 each 1  . Lancets (ONETOUCH ULTRASOFT) lancets Used to check  blood sugars once a day. 100 each 4  . metFORMIN (GLUCOPHAGE) 1000 MG tablet TAKE 1 TABLET (1,000 MG TOTAL) BY MOUTH 2 (TWO) TIMES DAILY WITH A MEAL. 180 tablet 1  . Multiple Vitamins-Minerals (CENTRUM SILVER ADULT 50+ PO) Take 1 tablet by mouth daily.    . ondansetron (ZOFRAN ODT) 4 MG disintegrating tablet Take 1 tablet (4 mg total) by mouth every 8 (eight) hours as needed. (Patient taking differently: Take 4 mg by mouth daily.) 20 tablet 1  . pantoprazole (PROTONIX) 20 MG tablet TAKE 1 TABLET (20 MG TOTAL) BY MOUTH 2 (TWO) TIMES DAILY BEFORE A MEAL. 60 tablet 3  . Semaglutide,0.25 or 0.5MG/DOS, (OZEMPIC, 0.25 OR 0.5 MG/DOSE,) 2 MG/1.5ML SOPN Inject 0.5 mg into the skin once a week. 3 mL 3  . tamsulosin (FLOMAX) 0.4 MG CAPS capsule Take 1 capsule (0.4 mg total) by mouth daily. 30 capsule 0   No current facility-administered medications on file prior to visit.    Social History   Tobacco Use  . Smoking status: Never  . Smokeless tobacco: Never  Vaping Use  . Vaping Use: Never used  Substance Use Topics  . Alcohol use: Not Currently  . Drug use: Never    Review of Systems  Constitutional:  Positive for fatigue. Negative for chills and fever.  Respiratory:  Negative for cough and shortness of breath.   Cardiovascular:  Negative for chest pain and palpitations.  Gastrointestinal:  Negative for abdominal pain, anal bleeding, blood in stool, nausea (resolved) and vomiting.     Objective:    BP 102/66 (BP Location: Left Arm, Patient Position: Sitting, Cuff Size: Normal)   Pulse 88   Temp 97.8 F (36.6 C) (Oral)   Ht 6' (1.829 m)   Wt 197 lb 9.6 oz (89.6 kg)   SpO2 99%   BMI 26.80 kg/m  BP Readings from Last 3 Encounters:  09/29/21 102/66  09/21/21 122/69  09/10/21 (!) 158/84   Wt Readings from Last 3 Encounters:  09/29/21 197 lb 9.6 oz (89.6 kg)  09/21/21 204 lb (92.5 kg)  09/17/21 204 lb (92.5 kg)    Physical Exam Vitals reviewed.  Constitutional:      Appearance:  She is well-developed.  Eyes:     Conjunctiva/sclera: Conjunctivae normal.  Cardiovascular:     Rate and Rhythm: Normal rate and regular rhythm.     Pulses: Normal pulses.     Heart sounds: Normal heart sounds.  Pulmonary:     Effort: Pulmonary effort is normal.     Breath sounds: Normal breath sounds. No wheezing, rhonchi or rales.  Skin:    General: Skin is warm and dry.  Neurological:     Mental Status: She is alert.  Psychiatric:        Speech: Speech normal.        Behavior: Behavior normal.  Thought Content: Thought content normal.       Assessment & Plan:   Problem List Items Addressed This Visit       Endocrine   DM (diabetes mellitus) ([place]) - Primary    Lab Results  Component Value Date   HGBA1C 6.2 (H) 09/07/2021  Discontinue glipizide. Continue metformin 1058m BID, ozempic 0.562m( from 46m82m Monitor for weight loss and consider further decrease of ozempic.       Relevant Orders   Microalbumin / creatinine urine ratio (Completed)     Other   Fatigue    Discussed with patient factorial including recent hospitalization, deconditioning from prior exercise routine, anemia ( hbg 10.2, ferrtin 23.3).  She has had significant weight loss in the setting of hospitalization, Ozempic use resulting in blood pressure  on the low end.  We agreed to trial stop lisinopril 5mg59m well as glipizide 5mg 746msed on excellent control of her A1c and concerned medications contributing to fatigue or risk of hypoglycemia.  Encouraged continued daily walking and gradually increasing to prior exercise status. Start ferrous sulfate. Consider OSA study if fatigue doesn't improve.  She will let me know how she is doing.  Close follow-up        I have discontinued LauriToyia Jelinekold's glipiZIDE, lisinopril, and oxybutynin. I am also having her maintain her Multiple Vitamins-Minerals (CENTRUM SILVER ADULT 50+ PO), aspirin EC, onetouch ultrasoft, Pen Needles, citalopram, Calcium-Vitamin  D-Vitamin K (VIACTIV CALCIUM PLUS D PO), Accu-Chek Aviva Plus, Accu-Chek FastClix Lancets, Accu-Chek Guide Me, metFORMIN, atorvastatin, Ozempic (0.25 or 0.5 MG/DOSE), tamsulosin, ondansetron, famotidine, and pantoprazole.   No orders of the defined types were placed in this encounter.   Return precautions given.   Risks, benefits, and alternatives of the medications and treatment plan prescribed today were discussed, and patient expressed understanding.   Education regarding symptom management and diagnosis given to patient on AVS.  Continue to follow with ArnetBurnard Hawthorne for routine health maintenance.   LauriRoosvelt HarpsI agreed with plan.   MargaMable Paris  I have spent 30 minutes with a patient including precharting, exam, reviewing medical records, and discussion plan of care.

## 2021-09-30 DIAGNOSIS — R5383 Other fatigue: Secondary | ICD-10-CM | POA: Insufficient documentation

## 2021-09-30 NOTE — Assessment & Plan Note (Addendum)
Discussed with patient factorial including recent hospitalization, deconditioning from prior exercise routine, anemia ( hbg 10.2, ferrtin 23.3).  She has had significant weight loss in the setting of hospitalization, Ozempic use resulting in blood pressure  on the low end.  We agreed to trial stop lisinopril 5mg  as well as glipizide 5mg   based on excellent control of her A1c and concerned medications contributing to fatigue or risk of hypoglycemia.  Encouraged continued daily walking and gradually increasing to prior exercise status. Start ferrous sulfate. Consider OSA study if fatigue doesn't improve.  She will let me know how she is doing.  Close follow-up

## 2021-09-30 NOTE — Assessment & Plan Note (Signed)
Lab Results  Component Value Date   HGBA1C 6.2 (H) 09/07/2021   Discontinue glipizide. Continue metformin 1000mg  BID, ozempic 0.5mg  ( from 1mg ). Monitor for weight loss and consider further decrease of ozempic.

## 2021-10-02 ENCOUNTER — Other Ambulatory Visit: Payer: Self-pay | Admitting: Physician Assistant

## 2021-10-04 ENCOUNTER — Encounter: Payer: Self-pay | Admitting: Family

## 2021-10-05 ENCOUNTER — Other Ambulatory Visit: Payer: Self-pay | Admitting: Family

## 2021-10-05 DIAGNOSIS — E119 Type 2 diabetes mellitus without complications: Secondary | ICD-10-CM

## 2021-10-05 DIAGNOSIS — I10 Essential (primary) hypertension: Secondary | ICD-10-CM

## 2021-10-05 MED ORDER — LISINOPRIL 5 MG PO TABS: 5.0000 mg | ORAL_TABLET | Freq: Every day | ORAL | 3 refills | Status: DC

## 2021-10-06 ENCOUNTER — Telehealth: Payer: Self-pay | Admitting: Gastroenterology

## 2021-10-06 ENCOUNTER — Encounter: Payer: Self-pay | Admitting: Gastroenterology

## 2021-10-07 ENCOUNTER — Encounter
Admission: RE | Disposition: A | Discharge status: Home / Self Care | Payer: Self-pay | Source: Home / Self Care | Attending: Gastroenterology

## 2021-10-07 ENCOUNTER — Telehealth: Payer: Self-pay

## 2021-10-07 ENCOUNTER — Ambulatory Visit: Payer: Medicare HMO | Admitting: Urgent Care

## 2021-10-07 ENCOUNTER — Other Ambulatory Visit: Payer: Self-pay | Admitting: Family

## 2021-10-07 ENCOUNTER — Other Ambulatory Visit: Payer: Self-pay

## 2021-10-07 ENCOUNTER — Encounter: Payer: Self-pay | Admitting: Gastroenterology

## 2021-10-07 ENCOUNTER — Ambulatory Visit
Admission: RE | Admit: 2021-10-07 | Discharge: 2021-10-07 | Disposition: A | Discharge status: Home / Self Care | Payer: Medicare HMO | Attending: Gastroenterology | Admitting: Gastroenterology

## 2021-10-07 DIAGNOSIS — I10 Essential (primary) hypertension: Secondary | ICD-10-CM | POA: Diagnosis not present

## 2021-10-07 DIAGNOSIS — K2980 Duodenitis without bleeding: Secondary | ICD-10-CM | POA: Diagnosis not present

## 2021-10-07 DIAGNOSIS — R1013 Epigastric pain: Secondary | ICD-10-CM

## 2021-10-07 DIAGNOSIS — E119 Type 2 diabetes mellitus without complications: Secondary | ICD-10-CM | POA: Diagnosis not present

## 2021-10-07 DIAGNOSIS — K219 Gastro-esophageal reflux disease without esophagitis: Secondary | ICD-10-CM | POA: Insufficient documentation

## 2021-10-07 DIAGNOSIS — K2970 Gastritis, unspecified, without bleeding: Secondary | ICD-10-CM | POA: Insufficient documentation

## 2021-10-07 DIAGNOSIS — Z8616 Personal history of COVID-19: Secondary | ICD-10-CM | POA: Insufficient documentation

## 2021-10-07 DIAGNOSIS — Z87442 Personal history of urinary calculi: Secondary | ICD-10-CM | POA: Insufficient documentation

## 2021-10-07 DIAGNOSIS — K30 Functional dyspepsia: Secondary | ICD-10-CM | POA: Diagnosis present

## 2021-10-07 DIAGNOSIS — D510 Vitamin B12 deficiency anemia due to intrinsic factor deficiency: Secondary | ICD-10-CM | POA: Insufficient documentation

## 2021-10-07 DIAGNOSIS — R87619 Unspecified abnormal cytological findings in specimens from cervix uteri: Secondary | ICD-10-CM

## 2021-10-07 HISTORY — PX: ESOPHAGOGASTRODUODENOSCOPY (EGD) WITH PROPOFOL: SHX5813

## 2021-10-07 LAB — GLUCOSE, CAPILLARY: Glucose-Capillary: 168 mg/dL — ABNORMAL HIGH (ref 70–99)

## 2021-10-07 SURGERY — ESOPHAGOGASTRODUODENOSCOPY (EGD) WITH PROPOFOL
Anesthesia: Monitor Anesthesia Care

## 2021-10-07 MED ORDER — FENTANYL CITRATE (PF) 100 MCG/2ML IJ SOLN
INTRAMUSCULAR | Status: DC | PRN
  Administered 2021-10-07 (×2): 50 ug via INTRAVENOUS

## 2021-10-07 MED ORDER — PROPOFOL 500 MG/50ML IV EMUL
INTRAVENOUS | Status: AC
  Filled 2021-10-07: qty 50

## 2021-10-07 MED ORDER — LIDOCAINE HCL (PF) 2 % IJ SOLN
INTRAMUSCULAR | Status: AC
  Filled 2021-10-07: qty 5

## 2021-10-07 MED ORDER — LIDOCAINE HCL (CARDIAC) PF 100 MG/5ML IV SOSY
PREFILLED_SYRINGE | INTRAVENOUS | Status: DC | PRN
  Administered 2021-10-07: 50 mg via INTRAVENOUS

## 2021-10-07 MED ORDER — MIDAZOLAM HCL 2 MG/2ML IJ SOLN
INTRAMUSCULAR | Status: DC | PRN
  Administered 2021-10-07: 2 mg via INTRAVENOUS

## 2021-10-07 MED ORDER — PROPOFOL 10 MG/ML IV BOLUS
INTRAVENOUS | Status: DC | PRN
  Administered 2021-10-07: 40 mg via INTRAVENOUS

## 2021-10-07 MED ORDER — PROPOFOL 500 MG/50ML IV EMUL
INTRAVENOUS | Status: DC | PRN
  Administered 2021-10-07: 50 ug/kg/min via INTRAVENOUS

## 2021-10-07 MED ORDER — FENTANYL CITRATE (PF) 100 MCG/2ML IJ SOLN
INTRAMUSCULAR | Status: AC
  Filled 2021-10-07: qty 2

## 2021-10-07 MED ORDER — MIDAZOLAM HCL 2 MG/2ML IJ SOLN
INTRAMUSCULAR | Status: AC
  Filled 2021-10-07: qty 2

## 2021-10-07 MED ORDER — SODIUM CHLORIDE 0.9 % IV SOLN: INTRAVENOUS | Status: DC

## 2021-10-07 NOTE — Anesthesia Preprocedure Evaluation (Addendum)
Anesthesia Evaluation  Patient identified by MRN, date of birth, ID band Patient awake and Patient confused    Reviewed: Allergy & Precautions, NPO status , Patient's Chart, lab work & pertinent test results  History of Anesthesia Complications Negative for: history of anesthetic complications  Airway Mallampati: II  TM Distance: <3 FB Neck ROM: Full    Dental   Pulmonary neg pulmonary ROS,    Pulmonary exam normal        Cardiovascular hypertension, Normal cardiovascular exam     Neuro/Psych Depression negative neurological ROS     GI/Hepatic Neg liver ROS, GERD  ,Eosinophilic esophagitis, stricture   Endo/Other  diabetes, Type 2  Renal/GU Renal disease (stones)  negative genitourinary   Musculoskeletal negative musculoskeletal ROS (+)   Abdominal   Peds  Hematology  (+) anemia ,   Anesthesia Other Findings 08/2021 Danielle Dickerson 2 grade 1 view  Reproductive/Obstetrics                            Anesthesia Physical Anesthesia Plan  ASA: 3  Anesthesia Plan: General   Post-op Pain Management:    Induction: Intravenous  PONV Risk Score and Plan:   Airway Management Planned: Natural Airway  Additional Equipment:   Intra-op Plan:   Post-operative Plan:   Informed Consent: I have reviewed the patients History and Physical, chart, labs and discussed the procedure including the risks, benefits and alternatives for the proposed anesthesia with the patient or authorized representative who has indicated his/her understanding and acceptance.       Plan Discussed with:   Anesthesia Plan Comments:        Anesthesia Quick Evaluation

## 2021-10-07 NOTE — Transfer of Care (Signed)
Immediate Anesthesia Transfer of Care Note  Patient: Danielle Dickerson  Procedure(s) Performed: ESOPHAGOGASTRODUODENOSCOPY (EGD) WITH PROPOFOL  Patient Location: PACU  Anesthesia Type:General  Level of Consciousness: sedated  Airway & Oxygen Therapy: Patient Spontanous Breathing and Patient connected to nasal cannula oxygen  Post-op Assessment: Report given to RN and Post -op Vital signs reviewed and stable  Post vital signs: Reviewed and stable  Last Vitals:  Vitals Value Taken Time  BP 148/82 10/07/21 0753  Temp    Pulse 66 10/07/21 0753  Resp 16 10/07/21 0753  SpO2 98 % 10/07/21 0753  Vitals shown include unvalidated device data.  Last Pain:  Vitals:   10/07/21 0655  TempSrc: Temporal  PainSc: 0-No pain         Complications: No notable events documented.

## 2021-10-07 NOTE — Op Note (Signed)
[facility] Gastroenterology Patient Name: Danielle Dickerson Procedure Date: 10/07/2021 7:37 AM MRN: [id number] Account #: [id number] Date of Birth: [birth year] Admit Type: Outpatient Age: 65 Room: [facility] ENDO ROOM 3 Gender: Female Note Status: Finalized Instrument Name: Upper Endoscope [id number] Procedure:             Upper GI endoscopy Indications:           Indigestion, pernicious anemia Providers:             Lin Landsman MD, MD Medicines:             General Anesthesia Complications:         No immediate complications. Estimated blood loss: None. Procedure:             Pre-Anesthesia Assessment:                        - Prior to the procedure, a History and Physical was                         performed, and patient medications and allergies were                         reviewed. The patient is competent. The risks and                         benefits of the procedure and the sedation options and                         risks were discussed with the patient. All questions                         were answered and informed consent was obtained.                         Patient identification and proposed procedure were                         verified by the physician, the nurse, the                         anesthesiologist, the anesthetist and the technician                         in the pre-procedure area in the procedure room in the                         endoscopy suite. Mental Status Examination: alert and                         oriented. Airway Examination: normal oropharyngeal                         airway and neck mobility. Respiratory Examination:                         clear to auscultation. CV Examination: normal.                         Prophylactic Antibiotics:  The patient does not require                         prophylactic antibiotics. Prior Anticoagulants: The                         patient has taken no previous anticoagulant or                          antiplatelet agents. ASA Grade Assessment: II - A                         patient with mild systemic disease. After reviewing                         the risks and benefits, the patient was deemed in                         satisfactory condition to undergo the procedure. The                         anesthesia plan was to use general anesthesia.                         Immediately prior to administration of medications,                         the patient was re-assessed for adequacy to receive                         sedatives. The heart rate, respiratory rate, oxygen                         saturations, blood pressure, adequacy of pulmonary                         ventilation, and response to care were monitored                         throughout the procedure. The physical status of the                         patient was re-assessed after the procedure.                        After obtaining informed consent, the endoscope was                         passed under direct vision. Throughout the procedure,                         the patient's blood pressure, pulse, and oxygen                         saturations were monitored continuously. The Endoscope                         was introduced through the mouth, and advanced to the  second part of duodenum. The upper GI endoscopy was                         accomplished without difficulty. The patient tolerated                         the procedure well. Findings:      The duodenal bulb and second portion of the duodenum were normal.       Biopsies were taken with a cold forceps for histology.      The entire examined stomach was normal. Biopsies were taken with a cold       forceps for histology.      The gastroesophageal junction and examined esophagus were normal.      The cardia and gastric fundus were normal on retroflexion.      Esophagogastric landmarks were identified: the gastroesophageal junction        was found at 40 cm from the incisors. Impression:            - Normal duodenal bulb and second portion of the                         duodenum. Biopsied.                        - Normal stomach. Biopsied.                        - Normal gastroesophageal junction and esophagus.                        - Esophagogastric landmarks identified. Recommendation:        - Await pathology results.                        - Discharge patient to home (with escort).                        - Resume previous diet today.                        - Continue present medications. Procedure Code(s):     --- Professional ---                        [phone], Esophagogastroduodenoscopy, flexible,                         transoral; with biopsy, single or multiple Diagnosis Code(s):     --- Professional ---                        K30, Functional dyspepsia CPT copyright 2019 American Medical Association. All rights reserved. The codes documented in this report are preliminary and upon coder review may  be revised to meet current compliance requirements. Dr. Libby Maw Toney Reil MD, MD 10/07/2021 7:52:37 AM This report has been signed electronically. Number of Addenda: 0 Note Initiated On: 10/07/2021 7:37 AM Estimated Blood Loss:  Estimated blood loss: none.      [facility]

## 2021-10-07 NOTE — Telephone Encounter (Signed)
[facility] referring for LSIL on pap smear, negative HPV/ Colpo MD only. Called and left voicemail for patient to call back to be scheduled.

## 2021-10-07 NOTE — H&P (Signed)
Danielle Darby, MD [address]  [place]  [place], [place] [address]  Main: [phone]  Fax: [phone] Pager: [phone]  Primary Care Physician:  Burnard Hawthorne, FNP Primary Gastroenterologist:  Dr. Cephas Dickerson  Pre-Procedure History & Physical: HPI:  Danielle Dickerson is a 65 y.o. female is here for an endoscopy.   Past Medical History:  Diagnosis Date   COVID-19    05/27/21   Depression    Diabetes mellitus without complication ([place])    Type II   Eosinophilic esophagitis    Esophageal stricture 2019   GERD (gastroesophageal reflux disease)    Hyperlipidemia    Pernicious anemia     Past Surgical History:  Procedure Laterality Date   ABDOMINAL HYSTERECTOMY  2006   has ovaries. Had heavy periods and was anemic. HAS cervix   BREAST EXCISIONAL BIOPSY Right 2009   phyllodes removed    BREAST SURGERY     CESAREAN SECTION     CHOLECYSTECTOMY OPEN     COLONOSCOPY WITH PROPOFOL N/A 02/02/2021   Procedure: COLONOSCOPY WITH PROPOFOL;  Surgeon: Lin Landsman, MD;  Location: [facility] ENDOSCOPY;  Service: Gastroenterology;  Laterality: N/A;   CYSTOSCOPY WITH STENT PLACEMENT Right 09/07/2021   Procedure: CYSTOSCOPY WITH STENT PLACEMENT; WITH RETROGRADE PYELOGRAM;  Surgeon: Hollice Espy, MD;  Location: [facility] ORS;  Service: Urology;  Laterality: Right;   CYSTOSCOPY/RETROGRADE/URETEROSCOPY/STONE EXTRACTION WITH BASKET  09/21/2021   Procedure: CYSTOSCOPY/RETROGRADE/URETEROSCOPY/STONE EXTRACTION WITH BASKET;  Surgeon: Hollice Espy, MD;  Location: [facility] ORS;  Service: Urology;;   TONSILLECTOMY  1966    Prior to Admission medications   Medication Sig Start Date End Date Taking? Authorizing Provider  Accu-Chek FastClix Lancets MISC Used to check blood sugars twice a day. 03/31/21  Yes Burnard Hawthorne, FNP  aspirin EC 81 MG tablet Take 81 mg by mouth daily.   Yes [provider]  atorvastatin (LIPITOR) 40 MG tablet TAKE 1 TABLET BY MOUTH EVERY DAY  07/02/21  Yes Burnard Hawthorne, FNP  Blood Glucose Monitoring Suppl (ACCU-CHEK GUIDE ME) w/Device KIT USE AS DIRECTED 03/31/21  Yes Arnett, Yvetta Coder, FNP  Calcium-Vitamin D-Vitamin K (VIACTIV CALCIUM PLUS D PO) Take 1 tablet by mouth daily.   Yes [provider]  citalopram (CELEXA) 20 MG tablet TAKE 1 TABLET BY MOUTH EVERY DAY 01/27/21  Yes Arnett, Yvetta Coder, FNP  famotidine (PEPCID) 20 MG tablet Take 20 mg by mouth at bedtime.   Yes [provider]  glucose blood (ACCU-CHEK AVIVA PLUS) test strip Used to check blood sugars twice a day. 03/31/21  Yes Burnard Hawthorne, FNP  Insulin Pen Needle (PEN NEEDLES) 32G X 4 MM MISC Used to give Victoza injections. 08/14/20  Yes Arnett, Yvetta Coder, FNP  Lancets [facility] ULTRASOFT) lancets Used to check blood sugars once a day. 01/30/20  Yes Arnett, Yvetta Coder, FNP  lisinopril (ZESTRIL) 5 MG tablet Take 1 tablet (5 mg total) by mouth daily. 10/05/21  Yes Burnard Hawthorne, FNP  metFORMIN (GLUCOPHAGE) 1000 MG tablet TAKE 1 TABLET (1,000 MG TOTAL) BY MOUTH 2 (TWO) TIMES DAILY WITH A MEAL. 07/01/21  Yes Arnett, Yvetta Coder, FNP  Multiple Vitamins-Minerals (CENTRUM SILVER ADULT 50+ PO) Take 1 tablet by mouth daily.   Yes [provider]  ondansetron (ZOFRAN ODT) 4 MG disintegrating tablet Take 1 tablet (4 mg total) by mouth every 8 (eight) hours as needed. Patient taking differently: Take 4 mg by mouth daily. 09/11/21  Yes Burnard Hawthorne, FNP  pantoprazole (PROTONIX) 20 MG tablet TAKE 1 TABLET (20 MG TOTAL) BY MOUTH 2 (TWO) TIMES DAILY BEFORE A MEAL. 09/22/21  Yes Arnett, Yvetta Coder, FNP  Semaglutide,0.25 or 0.5MG/DOS, (OZEMPIC, 0.25 OR 0.5 MG/DOSE,) 2 MG/1.5ML SOPN Inject 0.5 mg into the skin once a week. 08/18/21  Yes Burnard Hawthorne, FNP  tamsulosin (FLOMAX) 0.4 MG CAPS capsule Take 1 capsule (0.4 mg total) by mouth daily. 09/11/21  Yes Vaillancourt, Samantha, PA-C    Allergies as of 09/02/2021 - Review Complete 09/02/2021  Allergen  Reaction Noted   Pneumovax 23 [pneumococcal vac polyvalent] Swelling 05/18/2021   Prevnar 13 [pneumococcal 13-val conj vacc] Swelling 05/16/2021    Family History  Problem Relation Age of Onset   Congestive Heart Failure Mother    Colon cancer Father    Esophageal cancer Maternal Uncle    Thyroid cancer Neg Hx     Social History   Socioeconomic History   Marital status: Married    Spouse name: Not on file   Number of children: Not on file   Years of education: Not on file   Highest education level: Not on file  Occupational History   Not on file  Tobacco Use   Smoking status: Never   Smokeless tobacco: Never  Vaping Use   Vaping Use: Never used  Substance and Sexual Activity   Alcohol use: Not Currently   Drug use: Never   Sexual activity: Yes    Partners: Male  Other Topics Concern   Not on file  Social History Narrative   Moved from [place] 07/2019      Married   Husband retired   2 daughters that live in [place]      Homeschooling 29 yo grandson   Social Determinants of Health   Financial Resource Strain: Not on file  Food Insecurity: Not on file  Transportation Needs: Not on file  Physical Activity: Not on file  Stress: Not on file  Social Connections: Not on file  Intimate Partner Violence: Not on file    Review of Systems: See HPI, otherwise negative ROS  Physical Exam: BP (!) 149/82   Pulse 78   Temp (!) 96.5 F (35.8 C) (Temporal)   Resp 18   Ht 6' (1.829 m)   Wt 89.4 kg   SpO2 99%   BMI 26.72 kg/m  General:   Alert,  pleasant and cooperative in NAD Head:  Normocephalic and atraumatic. Neck:  Supple; no masses or thyromegaly. Lungs:  Clear throughout to auscultation.    Heart:  Regular rate and rhythm. Abdomen:  Soft, nontender and nondistended. Normal bowel sounds, without guarding, and without rebound.   Neurologic:  Alert and  oriented x4;  grossly normal neurologically.  Impression/Plan: Danielle Dickerson is here for an  endoscopy to be performed for Dyspepsia and pernicious anemia  Risks, benefits, limitations, and alternatives regarding  endoscopy have been reviewed with the patient.  Questions have been answered.  All parties agreeable.   Sherri Sear, MD  10/07/2021, 7:40 AM

## 2021-10-07 NOTE — Telephone Encounter (Signed)
Called patient about any questions she had patient stated she already had her egd done this morning and everything was fine

## 2021-10-07 NOTE — Anesthesia Postprocedure Evaluation (Signed)
Anesthesia Post Note  Patient: Danielle Dickerson  Procedure(s) Performed: ESOPHAGOGASTRODUODENOSCOPY (EGD) WITH PROPOFOL  Patient location during evaluation: PACU Anesthesia Type: MAC Level of consciousness: awake and alert Pain management: pain level controlled Vital Signs Assessment: post-procedure vital signs reviewed and stable Respiratory status: spontaneous breathing, nonlabored ventilation, respiratory function stable and patient connected to nasal cannula oxygen Cardiovascular status: blood pressure returned to baseline and stable Postop Assessment: no apparent nausea or vomiting Anesthetic complications: no   No notable events documented.   Last Vitals:  Vitals:   10/07/21 0804 10/07/21 0814  BP: (!) 146/94 (!) 151/92  Pulse: 60 68  Resp: 15 16  Temp:    SpO2: 100% 96%    Last Pain:  Vitals:   10/07/21 0814  TempSrc:   PainSc: 0-No pain                 Tim Wilhide Josph Macho

## 2021-10-08 LAB — SURGICAL PATHOLOGY

## 2021-10-08 NOTE — Telephone Encounter (Signed)
Patient is scheduled for 10/26/21 with CRS 

## 2021-10-09 NOTE — Telephone Encounter (Signed)
-----   Message from Danielle Reil, MD sent at 10/08/2021  4:41 PM EST ----- Please inform patient that the pathology results from recent upper endoscopy came back normal.  Recommend gastric emptying study  Rohini Vanga

## 2021-10-09 NOTE — Telephone Encounter (Signed)
Patient spoke to Dr Allegra Lai she is feeling much better now and gastric study not needed now

## 2021-10-12 ENCOUNTER — Other Ambulatory Visit: Payer: Self-pay

## 2021-10-12 ENCOUNTER — Ambulatory Visit
Admission: RE | Admit: 2021-10-12 | Discharge: 2021-10-12 | Disposition: A | Discharge status: Home / Self Care | Payer: Medicare HMO | Source: Ambulatory Visit | Attending: Urology | Admitting: Urology

## 2021-10-12 DIAGNOSIS — N201 Calculus of ureter: Secondary | ICD-10-CM | POA: Diagnosis not present

## 2021-10-17 ENCOUNTER — Other Ambulatory Visit: Payer: Self-pay | Admitting: Family

## 2021-10-17 DIAGNOSIS — E119 Type 2 diabetes mellitus without complications: Secondary | ICD-10-CM

## 2021-10-19 NOTE — Progress Notes (Signed)
10/20/21 10:22 AM   [name] Feb 25, 1956 [id number]  Referring provider:  Burnard Hawthorne, FNP [address] [place],  [place] [address] Chief Complaint  Patient presents with   Nephrolithiasis     HPI: Danielle Dickerson is a 65 y.o.female with a personal history of right ureteral stone with sepsis, who returns today for 4 week post-op follow-up with RUS prior.   She initially presented for a 72m stone. She later  developed E.coli bacteremia.  She was taken to the emergency room for stent placement on 09/07/2021.   09/07/2021 CT renal stone study, for recurrent right flank pain for 3 week, revealed a stable 3 mm distal right ureteral calculus and table mild-to-moderate right hydroureteronephrosis, with near complete resolution of right perinephric fluid.   On 09/21/2021 she underwent a right ureteroscopy with basket extraction of right ureteral stone, right retrograde pyelogram and ureteral stent exchange. Intraoperative finding revealed 3 mm right distal ureteral stone identified, able to be extracted via basket without need for laser ablation no additional stone fragment.  Stent replaced on tether.  Stone fragments were sent for analysis and revealed 100% calcium oxalate monohydrate.   She had and unremarkable renal ultrasound on 10/12/2021 .   She reports that she is doing well today.  She believes that prior to the stone event, she was having trouble keeping things down, chronic nausea and vomiting which may have led to the stone event.  She has no previous history of stones prior to this.  PMH: Past Medical History:  Diagnosis Date   COVID-19    05/27/21   Depression    Diabetes mellitus without complication ([place]    Type II   Eosinophilic esophagitis    Esophageal stricture 2019   GERD (gastroesophageal reflux disease)    Hyperlipidemia    Pernicious anemia     Surgical History: Past Surgical History:  Procedure Laterality Date   ABDOMINAL  HYSTERECTOMY  2006   has ovaries. Had heavy periods and was anemic. HAS cervix   BREAST EXCISIONAL BIOPSY Right 2009   phyllodes removed    BREAST SURGERY     CESAREAN SECTION     CHOLECYSTECTOMY OPEN     COLONOSCOPY WITH PROPOFOL N/A 02/02/2021   Procedure: COLONOSCOPY WITH PROPOFOL;  Surgeon: VLin Landsman MD;  [facility];  Service: Gastroenterology;  Laterality: N/A;   CYSTOSCOPY WITH STENT PLACEMENT Right 09/07/2021   Procedure: CYSTOSCOPY WITH STENT PLACEMENT; WITH RETROGRADE PYELOGRAM;  Surgeon: BHollice Espy MD;  Location: [facility] ORS;  Service: Urology;  Laterality: Right;   CYSTOSCOPY/RETROGRADE/URETEROSCOPY/STONE EXTRACTION WITH BASKET  09/21/2021   Procedure: CYSTOSCOPY/RETROGRADE/URETEROSCOPY/STONE EXTRACTION WITH BASKET;  Surgeon: BHollice Espy MD;  Location: [facility] ORS;  Service: Urology;;   ESOPHAGOGASTRODUODENOSCOPY (EGD) WITH PROPOFOL N/A 10/07/2021   Procedure: ESOPHAGOGASTRODUODENOSCOPY (EGD) WITH PROPOFOL;  Surgeon: VLin Landsman MD;  [facility];  Service: Gastroenterology;  Laterality: N/A;   TONSILLECTOMY  1966    Home Medications:  Allergies as of 10/20/2021       Reactions   Pneumovax 23 [pneumococcal Vac Polyvalent] Swelling   Causes arm swelling, redness.   Prevnar 13 [pneumococcal 13-val Conj Vacc] Swelling        Medication List        Accurate as of October 20, 2021 10:22 AM. If you have any questions, ask your nurse or doctor.          STOP taking these medications    tamsulosin 0.4 MG Caps capsule Commonly  known as: FLOMAX Stopped by: Hollice Espy, MD       TAKE these medications    Accu-Chek Aviva Plus test strip Generic drug: glucose blood Used to check blood sugars twice a day.   Accu-Chek Guide Me w/Device Kit USE AS DIRECTED   aspirin EC 81 MG tablet Take 81 mg by mouth daily.   atorvastatin 40 MG tablet Commonly known as: LIPITOR TAKE 1 TABLET BY MOUTH EVERY DAY   CENTRUM  SILVER ADULT 50+ PO Take 1 tablet by mouth daily.   citalopram 20 MG tablet Commonly known as: CELEXA TAKE 1 TABLET BY MOUTH EVERY DAY   famotidine 20 MG tablet Commonly known as: PEPCID Take 20 mg by mouth at bedtime.   lisinopril 5 MG tablet Commonly known as: ZESTRIL Take 1 tablet (5 mg total) by mouth daily.   metFORMIN 1000 MG tablet Commonly known as: GLUCOPHAGE TAKE 1 TABLET (1,000 MG TOTAL) BY MOUTH 2 (TWO) TIMES DAILY WITH A MEAL.   ondansetron 4 MG disintegrating tablet Commonly known as: Zofran ODT Take 1 tablet (4 mg total) by mouth every 8 (eight) hours as needed. What changed: when to take this   onetouch ultrasoft lancets Used to check blood sugars once a day.   Accu-Chek FastClix Lancets Misc Used to check blood sugars twice a day.   Ozempic (0.25 or 0.5 MG/DOSE) 2 MG/1.5ML Sopn Generic drug: Semaglutide(0.25 or 0.5MG/DOS) Inject 0.5 mg into the skin once a week.   pantoprazole 20 MG tablet Commonly known as: PROTONIX TAKE 1 TABLET (20 MG TOTAL) BY MOUTH 2 (TWO) TIMES DAILY BEFORE A MEAL.   Pen Needles 32G X 4 MM Misc Used to give Victoza injections.   VIACTIV CALCIUM PLUS D PO Take 1 tablet by mouth daily.        Allergies:  Allergies  Allergen Reactions   Pneumovax 23 [Pneumococcal Vac Polyvalent] Swelling    Causes arm swelling, redness.   Prevnar 13 [Pneumococcal 13-Val Conj Vacc] Swelling    Family History: Family History  Problem Relation Age of Onset   Congestive Heart Failure Mother    Colon cancer Father    Esophageal cancer Maternal Uncle    Thyroid cancer Neg Hx     Social History:  reports that she has never smoked. She has never used smokeless tobacco. She reports that she does not currently use alcohol. She reports that she does not use drugs.   Physical Exam: BP 127/82   Pulse 76   Ht 6' (1.829 m)   Wt 197 lb (89.4 kg)   BMI 26.72 kg/m   Constitutional:  Alert and oriented, No acute distress. HEENT: [place] AT,  moist mucus membranes.  Trachea midline, no masses. Cardiovascular: No clubbing, cyanosis, or edema. Respiratory: Normal respiratory effort, no increased work of breathing. Skin: No rashes, bruises or suspicious lesions. Neurologic: Grossly intact, no focal deficits, moving all 4 extremities. Psychiatric: Normal mood and affect.  Laboratory Data:  Lab Results  Component Value Date   CREATININE 0.97 09/10/2021    Lab Results  Component Value Date   HGBA1C 6.2 (H) 09/07/2021    Pertinent Imaging: CLINICAL DATA:  Right ureteral stone.   EXAM: RENAL / URINARY TRACT ULTRASOUND COMPLETE   COMPARISON:  CT kidney 09/07/2021   FINDINGS: Right Kidney:   Renal measurements: 12.7 x 5.5 x 6.2 cm = volume: 227 mL. Echogenicity within normal limits. No mass or hydronephrosis visualized.   Left Kidney:   Renal measurements: 13.1 x 5.2 x  5.8 cm = volume: 205 mL. Echogenicity within normal limits. No mass or hydronephrosis visualized.   Urinary bladder:   Appears normal for degree of bladder distention.   Other:   None.   IMPRESSION: Unremarkable renal ultrasound.     Electronically Signed   By: Iven Finn M.D.   On: 10/12/2021 22:38   Renal ultrasound images were personally reviewed, agree with radiologic interpretation.  Assessment & Plan:   Right ureteral stone  - S/p ureteroscopy   - stone analysis reviewed   - RUS was reassuring   -We discussed general stone prevention techniques including drinking plenty water with goal of producing 2.5 L urine daily, increased citric acid intake, avoidance of high oxalate containing foods, and decreased salt intake.  Information about dietary recommendations given today.   Follow-up as needed   I,Kailey Littlejohn,acting as a scribe for Hollice Espy, MD.,have documented all relevant documentation on the behalf of Hollice Espy, MD,as directed by  Hollice Espy, MD while in the presence of Hollice Espy, MD.  I  have reviewed the above documentation for accuracy and completeness, and I agree with the above.   Hollice Espy, MD  [facility] [address] [place], [place] [address] [phone]

## 2021-10-20 ENCOUNTER — Encounter: Payer: Self-pay | Admitting: Urology

## 2021-10-20 ENCOUNTER — Other Ambulatory Visit: Payer: Self-pay

## 2021-10-20 ENCOUNTER — Ambulatory Visit (INDEPENDENT_AMBULATORY_CARE_PROVIDER_SITE_OTHER): Payer: Medicare HMO | Admitting: Urology

## 2021-10-20 VITALS — BP 127/82 | HR 76 | Ht 72.0 in | Wt 197.0 lb

## 2021-10-20 DIAGNOSIS — N201 Calculus of ureter: Secondary | ICD-10-CM

## 2021-10-21 ENCOUNTER — Other Ambulatory Visit: Payer: Self-pay

## 2021-10-21 ENCOUNTER — Ambulatory Visit (INDEPENDENT_AMBULATORY_CARE_PROVIDER_SITE_OTHER): Payer: Medicare HMO | Admitting: Family

## 2021-10-21 ENCOUNTER — Encounter: Payer: Self-pay | Admitting: Family

## 2021-10-21 VITALS — BP 118/78 | HR 75 | Temp 98.3°F | Wt 195.6 lb

## 2021-10-21 DIAGNOSIS — E119 Type 2 diabetes mellitus without complications: Secondary | ICD-10-CM | POA: Diagnosis not present

## 2021-10-21 DIAGNOSIS — D649 Anemia, unspecified: Secondary | ICD-10-CM

## 2021-10-21 DIAGNOSIS — I10 Essential (primary) hypertension: Secondary | ICD-10-CM | POA: Diagnosis not present

## 2021-10-21 DIAGNOSIS — R5383 Other fatigue: Secondary | ICD-10-CM

## 2021-10-21 LAB — CBC WITH DIFFERENTIAL/PLATELET
Basophils Absolute: 0 10*3/uL (ref 0.0–0.1)
Basophils Relative: 0.4 % (ref 0.0–3.0)
Eosinophils Absolute: 0.2 10*3/uL (ref 0.0–0.7)
Eosinophils Relative: 3 % (ref 0.0–5.0)
HCT: 30.3 % — ABNORMAL LOW (ref 36.0–46.0)
Hemoglobin: 10.3 g/dL — ABNORMAL LOW (ref 12.0–15.0)
Lymphocytes Relative: 23.1 % (ref 12.0–46.0)
Lymphs Abs: 1.2 10*3/uL (ref 0.7–4.0)
MCHC: 34 g/dL (ref 30.0–36.0)
MCV: 83.7 fl (ref 78.0–100.0)
Monocytes Absolute: 0.2 10*3/uL (ref 0.1–1.0)
Monocytes Relative: 4.5 % (ref 3.0–12.0)
Neutro Abs: 3.7 10*3/uL (ref 1.4–7.7)
Neutrophils Relative %: 69 % (ref 43.0–77.0)
Platelets: 185 10*3/uL (ref 150.0–400.0)
RBC: 3.62 Mil/uL — ABNORMAL LOW (ref 3.87–5.11)
RDW: 14.3 % (ref 11.5–15.5)
WBC: 5.4 10*3/uL (ref 4.0–10.5)

## 2021-10-21 LAB — IBC + FERRITIN
Ferritin: 29.3 ng/mL (ref 10.0–291.0)
Iron: 54 ug/dL (ref 42–145)
Saturation Ratios: 17.5 % — ABNORMAL LOW (ref 20.0–50.0)
TIBC: 309.4 ug/dL (ref 250.0–450.0)
Transferrin: 221 mg/dL (ref 212.0–360.0)

## 2021-10-21 MED ORDER — LISINOPRIL 2.5 MG PO TABS
2.5000 mg | ORAL_TABLET | Freq: Every day | ORAL | Status: DC
Start: 2021-10-21 — End: 2022-01-25

## 2021-10-21 NOTE — Assessment & Plan Note (Signed)
Resolved. Patient well appearing today. Blood pressure under adequate control. She has lost 4 lbs. Will continue to monitor closely.

## 2021-10-21 NOTE — Assessment & Plan Note (Addendum)
Discussed iron pill gastritis see on EGD 10/07/21. Pending ferritin, CBC. She can tolerate ferrous sulfate 325mg  qd if needed. If she develops epigastric discomfort or anemia fails to respond, we discussed consult with hematology for IV iron infusion.

## 2021-10-21 NOTE — Progress Notes (Signed)
Subjective:    Patient ID: Danielle Dickerson, female    DOB: Aug 30, 1956, 65 y.o.   MRN: [id number]  CC: Danielle Dickerson is a 65 y.o. female who presents today for follow up.   HPI: She feels well today No new complaints  Energy has returned to baseline. She is walking daily and busy getting ready for holidays Nausea resolved.   Discontinue glipizide at last visit.  She is compliant with metformin 1067m BID, ozempic 0.53m She is compliant with lisinopril 2.5 mg ( decreased from 37m537md) She is no longer on ferrous sulfate.  She had follow-up with Dr. AshHollice Espysterday for right ureteral stone .  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 Had heavy periods and was anemic. HAS cervix   BREAST EXCISIONAL BIOPSY Right 2009   phyllodes removed    BREAST SURGERY     CESAREAN SECTION     CHOLECYSTECTOMY OPEN     COLONOSCOPY WITH PROPOFOL N/A 02/02/2021   Procedure: COLONOSCOPY WITH PROPOFOL;  Surgeon: VanLin LandsmanD;  [facility];  Service: Gastroenterology;  Laterality: N/A;   CYSTOSCOPY WITH STENT PLACEMENT Right 09/07/2021   Procedure: CYSTOSCOPY WITH STENT PLACEMENT; WITH RETROGRADE PYELOGRAM;  Surgeon: BraHollice EspyD;  Location: [facility];  Service: Urology;  Laterality: Right;   CYSTOSCOPY/RETROGRADE/URETEROSCOPY/STONE EXTRACTION WITH BASKET  09/21/2021   Procedure: CYSTOSCOPY/RETROGRADE/URETEROSCOPY/STONE EXTRACTION WITH BASKET;  Surgeon: BraHollice EspyD;  Location: [facility];  Service: Urology;;   ESOPHAGOGASTRODUODENOSCOPY (EGD) WITH PROPOFOL N/A 10/07/2021    Procedure: ESOPHAGOGASTRODUODENOSCOPY (EGD) WITH PROPOFOL;  Surgeon: VanLin LandsmanD;  [facility];  Service: Gastroenterology;  Laterality: N/A;   TONSILLECTOMY  1966   Family History  Problem Relation Age of Onset   Congestive Heart Failure Mother    Colon cancer Father    Esophageal cancer Maternal Uncle    Thyroid cancer Neg Hx     Allergies: Pneumovax 23 [pneumococcal vac polyvalent] and Prevnar 13 [pneumococcal 13-val conj vacc] Current Outpatient Medications on File Prior to Visit  Medication Sig Dispense Refill   Accu-Chek FastClix Lancets MISC Used to check blood sugars twice a day. 100 each 3   aspirin EC 81 MG tablet Take 81 mg by mouth daily.     atorvastatin (LIPITOR) 40 MG tablet TAKE 1 TABLET BY MOUTH EVERY DAY 90 tablet 1   Blood Glucose Monitoring Suppl (ACCU-CHEK GUIDE ME) w/Device KIT USE AS DIRECTED 1 kit 0   Calcium-Vitamin D-Vitamin K (VIACTIV CALCIUM PLUS D PO) Take 1 tablet by mouth daily.     citalopram (CELEXA) 20 MG tablet TAKE 1 TABLET BY MOUTH EVERY DAY 90 tablet 2   famotidine (PEPCID) 20 MG tablet Take 20 mg by mouth at bedtime.     glucose blood (ACCU-CHEK AVIVA PLUS) test strip Used to check blood sugars twice a day. 100 each 5   Insulin Pen Needle (PEN NEEDLES) 32G X 4 MM MISC Used to give Victoza injections. 100 each 1   Lancets (ONETOUCH ULTRASOFT) lancets Used to check blood sugars once a  day. 100 each 4   metFORMIN (GLUCOPHAGE) 1000 MG tablet TAKE 1 TABLET (1,000 MG TOTAL) BY MOUTH 2 (TWO) TIMES DAILY WITH A MEAL. 180 tablet 1   Multiple Vitamins-Minerals (CENTRUM SILVER ADULT 50+ PO) Take 1 tablet by mouth daily.     ondansetron (ZOFRAN ODT) 4 MG disintegrating tablet Take 1 tablet (4 mg total) by mouth every 8 (eight) hours as needed. (Patient taking differently: Take 4 mg by mouth daily.) 20 tablet 1   pantoprazole (PROTONIX) 20 MG tablet TAKE 1 TABLET (20 MG TOTAL) BY MOUTH 2 (TWO) TIMES DAILY BEFORE A MEAL. (Patient taking  differently: Take 20 mg by mouth daily.) 60 tablet 3   Semaglutide,0.25 or 0.5MG/DOS, (OZEMPIC, 0.25 OR 0.5 MG/DOSE,) 2 MG/1.5ML SOPN Inject 0.5 mg into the skin once a week. 3 mL 3   No current facility-administered medications on file prior to visit.    Social History   Tobacco Use   Smoking status: Never   Smokeless tobacco: Never  Vaping Use   Vaping Use: Never used  Substance Use Topics   Alcohol use: Not Currently   Drug use: Never    Review of Systems  Constitutional:  Negative for chills, fatigue (resolved) and fever.  Respiratory:  Negative for cough.   Cardiovascular:  Negative for chest pain and palpitations.  Gastrointestinal:  Negative for nausea (resolved) and vomiting.     Objective:    BP 118/78 (BP Location: Left Arm, Patient Position: Sitting, Cuff Size: Normal)   Pulse 75   Temp 98.3 F (36.8 C) (Oral)   Wt 195 lb 9.6 oz (88.7 kg)   SpO2 98%   BMI 26.53 kg/m  BP Readings from Last 3 Encounters:  10/21/21 118/78  10/20/21 127/82  10/07/21 (!) 151/92   Wt Readings from Last 3 Encounters:  10/21/21 195 lb 9.6 oz (88.7 kg)  10/20/21 197 lb (89.4 kg)  10/07/21 197 lb (89.4 kg)    Physical Exam Vitals reviewed.  Constitutional:      Appearance: She is well-developed.  Eyes:     Conjunctiva/sclera: Conjunctivae normal.  Cardiovascular:     Rate and Rhythm: Normal rate and regular rhythm.     Pulses: Normal pulses.     Heart sounds: Normal heart sounds.  Pulmonary:     Effort: Pulmonary effort is normal.     Breath sounds: Normal breath sounds. No wheezing, rhonchi or rales.  Skin:    General: Skin is warm and dry.  Neurological:     Mental Status: She is alert.  Psychiatric:        Speech: Speech normal.        Behavior: Behavior normal.        Thought Content: Thought content normal.       Assessment & Plan:   Problem List Items Addressed This Visit       Cardiovascular and Mediastinum   HTN (hypertension)   Relevant Medications    lisinopril (ZESTRIL) 2.5 MG tablet     Endocrine   DM (diabetes mellitus) ([place])   Relevant Medications   lisinopril (ZESTRIL) 2.5 MG tablet     Other   Anemia - Primary    Discussed iron pill gastritis see on EGD 10/07/21. Pending ferritin, CBC. She can tolerate ferrous sulfate 339m qd if needed. If she develops epigastric discomfort or anemia fails to respond, we discussed consult with hematology for IV iron infusion.       Relevant Orders   CBC with Differential/Platelet  IBC + Ferritin   Fatigue    Resolved. Patient well appearing today. Blood pressure under adequate control. She has lost 4 lbs. Will continue to monitor closely.         I have changed Danielle Dickerson's lisinopril. I am also having her maintain her Multiple Vitamins-Minerals (CENTRUM SILVER ADULT 50+ PO), aspirin EC, onetouch ultrasoft, Pen Needles, citalopram, Calcium-Vitamin D-Vitamin K (VIACTIV CALCIUM PLUS D PO), Accu-Chek Aviva Plus, Accu-Chek FastClix Lancets, Accu-Chek Guide Me, metFORMIN, atorvastatin, Ozempic (0.25 or 0.5 MG/DOSE), ondansetron, famotidine, and pantoprazole.   Meds ordered this encounter  Medications   lisinopril (ZESTRIL) 2.5 MG tablet    Sig: Take 1 tablet (2.5 mg total) by mouth daily.    Order Specific Question:   Supervising Provider    Answer:   Crecencio Mc [[id number]]    Return precautions given.   Risks, benefits, and alternatives of the medications and treatment plan prescribed today were discussed, and patient expressed understanding.   Education regarding symptom management and diagnosis given to patient on AVS.  Continue to follow with Burnard Hawthorne, FNP for routine health maintenance.   [name] and I agreed with plan.   Mable Paris, FNP

## 2021-10-26 ENCOUNTER — Ambulatory Visit (INDEPENDENT_AMBULATORY_CARE_PROVIDER_SITE_OTHER): Payer: Medicare HMO | Admitting: Obstetrics and Gynecology

## 2021-10-26 ENCOUNTER — Encounter: Payer: Self-pay | Admitting: Obstetrics and Gynecology

## 2021-10-26 ENCOUNTER — Other Ambulatory Visit (HOSPITAL_COMMUNITY)
Admission: RE | Admit: 2021-10-26 | Discharge: 2021-10-26 | Disposition: A | Discharge status: Home / Self Care | Payer: Medicare HMO | Source: Ambulatory Visit | Attending: Obstetrics and Gynecology | Admitting: Obstetrics and Gynecology

## 2021-10-26 ENCOUNTER — Other Ambulatory Visit: Payer: Self-pay

## 2021-10-26 VITALS — BP 124/72 | Ht 72.0 in | Wt 195.6 lb

## 2021-10-26 DIAGNOSIS — R87612 Low grade squamous intraepithelial lesion on cytologic smear of cervix (LGSIL): Secondary | ICD-10-CM | POA: Diagnosis present

## 2021-10-26 NOTE — Progress Notes (Addendum)
   GYNECOLOGY CLINIC COLPOSCOPY PROCEDURE NOTE  65 y.o. 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 Discussed underlying role for HPV infection in the development of cervical dysplasia, its natural history and progression/regression, need for surveillance.  Is the patient  pregnant: No LMP: No LMP recorded. Patient has had a hysterectomy. Smoking status:  reports that she has never smoked. She has never used smokeless tobacco. Contraception: none Future fertility desired:  No  Patient given informed consent, signed copy in the chart, time out was performed.  The patient was position in dorsal lithotomy position. Speculum was placed the cervix was visualized.   After application of acetic acid colposcopic inspection of the cervix was undertaken.   Colposcopy adequate, full visualization of transformation zone: No Aceto-white changes noted at 12 o'clock; corresponding biopsies obtained.   ECC specimen obtained:  Yes  All specimens were labeled and sent to pathology.   Patient was given post procedure instructions.  Will follow up pathology and manage accordingly.  Routine preventative health maintenance measures emphasized.  Physical Exam Genitourinary:          Discussed vulvar finding, recommended ongoing yearly surveillance and possible biopsy. Follow up for vulvar monitoring/biopsy in 6 months.   Adelene Idler MD [facility], [facility] 10/26/2021 9:35 AM

## 2021-10-26 NOTE — Patient Instructions (Signed)
Colposcopy, Care After The following information offers guidance on how to care for yourself after your procedure. Your doctor may also give you more specific instructions. If you have problems or questions, contact your doctor. What can I expect after the procedure? If you did not have a sample of your tissue taken out (did not have a biopsy), you may only have some spotting of blood for a few days. You can go back to your normal activities. If you had a sample of your tissue taken out, it is common to have: Soreness and mild pain. These may last for a few days. Mild bleeding or fluid (discharge) coming from your vagina. The fluid will look dark and grainy. You may have this for a few days. The fluid may be caused by a liquid that was used during your procedure. You may need to wear a sanitary pad. Spotting of blood for at least 48 hours after the procedure. Follow these instructions at home: Medicines Take over-the-counter and prescription medicines only as told by your doctor. Ask your doctor what over-the-counter pain medicines and prescription medicines you can start taking again. This is very important if you take blood thinners. Activity For at least 3 days, or for as long as told by your doctor, avoid: Douching. Using tampons. Having sex. Return to your normal activities as told by your doctor. Ask your doctor what activities are safe for you. General instructions Ask your doctor if you may take baths, swim, or use a hot tub. You may take showers. If you use birth control (contraception), keep using it. Keep all follow-up visits. Contact a doctor if: You have a fever or chills. You faint or feel light-headed. Get help right away if: You bleed a lot from your vagina. A lot of bleeding means that the bleeding soaks through a pad in less than 1 hour. You have clumps of blood (blood clots) coming from your vagina. You have signs that could mean you have an infection. This may be fluid  coming from your vagina that is: Different than normal. Yellow. Bad-smelling. You have very bad pain or cramps in your lower belly that do not get better with medicine. Summary If you did not have a sample of your tissue taken out, you may only have some spotting of blood for a few days. You can go back to your normal activities. If you had a sample of your tissue taken out, it is common to have mild pain for a few days and spotting for 48 hours. Avoid douching, using tampons, and having sex for at least 3 days after the procedure or for as long as told. Get help right away if you have a lot of bleeding, very bad pain, or signs of infection. This information is not intended to replace advice given to you by your health care provider. Make sure you discuss any questions you have with your health care provider. Document Revised: 04/12/2021 Document Reviewed: 04/12/2021 Elsevier Patient Education  2022 Elsevier Inc.  

## 2021-10-27 ENCOUNTER — Encounter: Payer: Self-pay | Admitting: Obstetrics and Gynecology

## 2021-10-27 LAB — SURGICAL PATHOLOGY

## 2021-10-28 ENCOUNTER — Ambulatory Visit (INDEPENDENT_AMBULATORY_CARE_PROVIDER_SITE_OTHER): Payer: Medicare HMO

## 2021-10-28 ENCOUNTER — Other Ambulatory Visit: Payer: Self-pay

## 2021-10-28 ENCOUNTER — Ambulatory Visit: Payer: Medicare HMO | Admitting: Obstetrics & Gynecology

## 2021-10-28 DIAGNOSIS — E538 Deficiency of other specified B group vitamins: Secondary | ICD-10-CM | POA: Diagnosis not present

## 2021-10-28 MED ORDER — CYANOCOBALAMIN 1000 MCG/ML IJ SOLN
1000.0000 ug | Freq: Once | INTRAMUSCULAR | Status: AC
Start: 2021-10-28 — End: 2021-10-28
  Administered 2021-10-28: 1000 ug via INTRAMUSCULAR

## 2021-10-28 NOTE — Progress Notes (Signed)
Patient presented for B 12 injection to left deltoid, patient voiced no concerns nor showed any signs of distress during injection. 

## 2021-11-11 ENCOUNTER — Other Ambulatory Visit: Payer: Self-pay | Admitting: Family Medicine

## 2021-11-11 ENCOUNTER — Other Ambulatory Visit: Payer: Self-pay | Admitting: Family

## 2021-11-11 DIAGNOSIS — Z0000 Encounter for general adult medical examination without abnormal findings: Secondary | ICD-10-CM

## 2021-11-11 DIAGNOSIS — I10 Essential (primary) hypertension: Secondary | ICD-10-CM

## 2021-11-22 ENCOUNTER — Other Ambulatory Visit: Payer: Self-pay | Admitting: Family

## 2021-11-27 ENCOUNTER — Ambulatory Visit (INDEPENDENT_AMBULATORY_CARE_PROVIDER_SITE_OTHER): Payer: Medicare HMO

## 2021-11-27 ENCOUNTER — Other Ambulatory Visit: Payer: Self-pay

## 2021-11-27 DIAGNOSIS — E538 Deficiency of other specified B group vitamins: Secondary | ICD-10-CM | POA: Diagnosis not present

## 2021-11-27 MED ORDER — CYANOCOBALAMIN 1000 MCG/ML IJ SOLN
1000.0000 ug | Freq: Once | INTRAMUSCULAR | Status: AC
  Administered 2021-11-27: 1000 ug via INTRAMUSCULAR

## 2021-11-27 NOTE — Progress Notes (Signed)
Danielle Dickerson presents today for injection per MD orders. B12 injection  administered IM in right Upper Arm. Administration without incident. Patient tolerated well.  Marcayla Budge,cma

## 2021-11-27 NOTE — Telephone Encounter (Signed)
Called and discussed with patient- addendum per patient request.

## 2021-12-03 NOTE — Addendum Note (Signed)
Addended by: Charlyne Mom D on: 12/03/2021 03:10 PM   Modules accepted: Orders

## 2021-12-11 ENCOUNTER — Encounter: Payer: Self-pay | Admitting: Family

## 2021-12-14 ENCOUNTER — Other Ambulatory Visit: Payer: Self-pay

## 2021-12-14 MED ORDER — OZEMPIC (1 MG/DOSE) 4 MG/3ML ~~LOC~~ SOPN: 1.0000 mg | PEN_INJECTOR | SUBCUTANEOUS | 1 refills | Status: DC

## 2021-12-16 ENCOUNTER — Other Ambulatory Visit: Payer: Self-pay | Admitting: Family

## 2021-12-16 DIAGNOSIS — Z0000 Encounter for general adult medical examination without abnormal findings: Secondary | ICD-10-CM

## 2021-12-16 MED ORDER — CYANOCOBALAMIN 1000 MCG/ML IJ SOLN
1000.0000 ug | Freq: Once | INTRAMUSCULAR | Status: AC
  Administered 2021-11-27: 1000 ug via INTRAMUSCULAR

## 2021-12-16 NOTE — Addendum Note (Signed)
Addended by: Charlyne Mom D on: 12/16/2021 04:29 PM   Modules accepted: Orders

## 2021-12-29 ENCOUNTER — Other Ambulatory Visit: Payer: Self-pay

## 2021-12-29 ENCOUNTER — Other Ambulatory Visit: Payer: Self-pay | Admitting: Family

## 2021-12-29 ENCOUNTER — Ambulatory Visit (INDEPENDENT_AMBULATORY_CARE_PROVIDER_SITE_OTHER): Payer: Medicare HMO

## 2021-12-29 DIAGNOSIS — K219 Gastro-esophageal reflux disease without esophagitis: Secondary | ICD-10-CM

## 2021-12-29 DIAGNOSIS — E538 Deficiency of other specified B group vitamins: Secondary | ICD-10-CM | POA: Diagnosis not present

## 2021-12-29 MED ORDER — CYANOCOBALAMIN 1000 MCG/ML IJ SOLN
1000.0000 ug | Freq: Once | INTRAMUSCULAR | Status: AC
  Administered 2021-12-29: 1000 ug via INTRAMUSCULAR

## 2021-12-29 NOTE — Progress Notes (Signed)
Pt presented today for B12 injection. Right deltoid, IM. Pt voiced no concerns nor showed any signs of distress.

## 2022-01-06 ENCOUNTER — Other Ambulatory Visit: Payer: Self-pay

## 2022-01-06 ENCOUNTER — Encounter: Payer: Self-pay | Admitting: Gastroenterology

## 2022-01-06 ENCOUNTER — Ambulatory Visit (INDEPENDENT_AMBULATORY_CARE_PROVIDER_SITE_OTHER): Payer: Medicare HMO | Admitting: Gastroenterology

## 2022-01-06 VITALS — BP 122/78 | HR 83 | Temp 97.8°F | Ht 72.0 in | Wt 189.0 lb

## 2022-01-06 DIAGNOSIS — D510 Vitamin B12 deficiency anemia due to intrinsic factor deficiency: Secondary | ICD-10-CM

## 2022-01-06 NOTE — Progress Notes (Signed)
Cephas Darby, MD [address]  [place]  [place], [place] [address]  Main: [phone]  Fax: [phone]    Gastroenterology Consultation  Referring Provider:     Burnard Hawthorne, FNP Primary Care Physician:  Burnard Hawthorne, FNP Primary Gastroenterologist:  Dr. Cephas Darby Reason for Consultation:     Dyspepsia, pernicious anemia        HPI:   Danielle Dickerson is a 66 y.o. female referred by Dr. Burnard Hawthorne, FNP  for consultation & management of dyspepsia, colon cancer screening.  Patient reports more than 1 month history of postprandial nausea associated with early satiety, upper abdominal discomfort.  She has history of diabetes, most recent hemoglobin A1c is 8.4, on insulin, started on Victoza about a month ago.  She has moved from [place] to [place] last year.  She has 2 daughters who live here.  She is says it has been stressful in last few months, not able to concentrate on healthy foods.  Her blood sugars are improving, she is working on [employer].  She reports her weaknesses carbs.  Her weight has been stable, worried that she is not losing weight.  Normal TSH, CBC, CMP are normal  She was experiencing difficulty swallowing few years ago, she was told that she has eosinophilic esophagitis based on the EGD several years ago when she was in [place], she had a stricture and it was stretched based on her report.  Since then, she was started on Protonix 40 mg daily.  She denies any lower GI symptoms She is also due for colonoscopy, waiting for her Medicaid to become effective She does not smoke or drink alcohol  Follow up visit 01/07/21: Patient reports that her dyspepsia symptoms have resolved since stopping glipizide.  Her symptoms were attributed to Victoza.  Her hemoglobin A1c is currently down to 6.3.  She is concerned about pills getting stuck in her throat.  She does have incomplete emptying every time she has a BM associated with straining.  She  thinks her hemorrhoids are flaring up, cause itching.  She thinks she has to add more fiber in her diet.  Limited intake of water only  Follow-up visit 09/02/2021 Patient is here to discuss about her recurrence of symptoms of dyspepsia including epigastric discomfort, nausea, vomiting.  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  She is currently taking B12 injections.  Her hemoglobin is normal.  No evidence of iron deficiency.  She is referred to discuss about upper endoscopy.  Patient is currently on low-dose Ozempic.  Her diabetes is fairly under control Patient does not smoke or drink alcohol  Follow-up visit 01/06/2022 Patient is here for follow-up of pernicious anemia.  Her abdominal pain has resolved since treatment of nephrolithiasis.  Her EGD was unremarkable.  There was no evidence of H. pylori or atrophic gastritis.  She continues to take oral iron as well as B12 shots monthly.  She does report intermittent nausea to let us only.  NSAIDs: None  Antiplts/Anticoagulants/Anti thrombotics: None  GI Procedures: Colonoscopy 5 years ago, reportedly normal Father with colon cancer at age 37  Upper endoscopy 10/07/2021 - Normal duodenal bulb and second portion of the duodenum. Biopsied. - Normal stomach. Biopsied. - Normal gastroesophageal junction and esophagus. - Esophagogastric landmarks identified. DIAGNOSIS:  A. DUODENUM; COLD BIOPSY:  - DUODENAL MUCOSA WITH MILD CHRONIC DUODENITIS, NON-SPECIFIC.  - NEGATIVE FOR FEATURES OF CELIAC, DYSPLASIA,  AND MALIGNANCY.   B.  STOMACH, RANDOM; COLD BIOPSY:  - ANTRAL MUCOSA WITH REACTIVE GASTRITIS.  - OXYNTIC MUCOSA WITH VERY FOCAL SUPERFICIAL CRYSTALLINE DEPOSITION  CONSISTENT WITH IRON PILL GASTRITIS.  - NEGATIVE FOR H. PYLORI, INTESTINAL METAPLASIA, DYSPLASIA, AND  MALIGNANCY.  Colonoscopy 02/02/2021 - The entire examined colon is normal. - Diverticulosis in the sigmoid  colon. - The distal rectum and anal verge are normal on retroflexion view. - No specimens collected.  Past Medical History:  Diagnosis Date   COVID-19    05/27/21   Depression    Diabetes mellitus without complication ([place])    Type II   Eosinophilic esophagitis    Esophageal stricture 2019   GERD (gastroesophageal reflux disease)    Hyperlipidemia    Pernicious anemia     Past Surgical History:  Procedure Laterality Date   BREAST EXCISIONAL BIOPSY Right 2009   phyllodes removed    BREAST SURGERY     CESAREAN SECTION     CHOLECYSTECTOMY OPEN     COLONOSCOPY WITH PROPOFOL N/A 02/02/2021   Procedure: COLONOSCOPY WITH PROPOFOL;  Surgeon: Lin Landsman, MD;  Location: [place];  Service: Gastroenterology;  Laterality: N/A;   CYSTOSCOPY WITH STENT PLACEMENT Right 09/07/2021   Procedure: CYSTOSCOPY WITH STENT PLACEMENT; WITH RETROGRADE PYELOGRAM;  Surgeon: Hollice Espy, MD;  Location: [facility];  Service: Urology;  Laterality: Right;   CYSTOSCOPY/RETROGRADE/URETEROSCOPY/STONE EXTRACTION WITH BASKET  09/21/2021   Procedure: CYSTOSCOPY/RETROGRADE/URETEROSCOPY/STONE EXTRACTION WITH BASKET;  Surgeon: Hollice Espy, MD;  Location: [facility];  Service: Urology;;   ESOPHAGOGASTRODUODENOSCOPY (EGD) WITH PROPOFOL N/A 10/07/2021   Procedure: ESOPHAGOGASTRODUODENOSCOPY (EGD) WITH PROPOFOL;  Surgeon: Lin Landsman, MD;  [facility];  Service: Gastroenterology;  Laterality: N/A;   SUPRACERVICAL ABDOMINAL HYSTERECTOMY  2006   has ovaries. Had heavy periods and was anemic. HAS cervix   TONSILLECTOMY  1966    Current Outpatient Medications:    Accu-Chek FastClix Lancets MISC, Used to check blood sugars twice a day., Disp: 100 each, Rfl: 3   aspirin EC 81 MG tablet, Take 81 mg by mouth daily., Disp: , Rfl:    atorvastatin (LIPITOR) 40 MG tablet, TAKE 1 TABLET BY MOUTH EVERY DAY, Disp: 90 tablet, Rfl: 3   Blood Glucose Monitoring Suppl (ACCU-CHEK GUIDE ME) w/Device  KIT, USE AS DIRECTED, Disp: 1 kit, Rfl: 0   Calcium-Vitamin D-Vitamin K (VIACTIV CALCIUM PLUS D PO), Take 1 tablet by mouth daily., Disp: , Rfl:    citalopram (CELEXA) 20 MG tablet, TAKE 1 TABLET BY MOUTH EVERY DAY, Disp: 90 tablet, Rfl: 2   famotidine (PEPCID) 20 MG tablet, Take 20 mg by mouth at bedtime., Disp: , Rfl:    ferrous sulfate 325 (65 FE) MG EC tablet, Take 325 mg by mouth 3 (three) times daily with meals., Disp: , Rfl:    glucose blood (ACCU-CHEK AVIVA PLUS) test strip, Used to check blood sugars twice a day., Disp: 100 each, Rfl: 5   Insulin Pen Needle (PEN NEEDLES) 32G X 4 MM MISC, Used to give Victoza injections., Disp: 100 each, Rfl: 1   Lancets (ONETOUCH ULTRASOFT) lancets, Used to check blood sugars once a day., Disp: 100 each, Rfl: 4   lisinopril (ZESTRIL) 2.5 MG tablet, Take 1 tablet (2.5 mg total) by mouth daily., Disp: , Rfl:    metFORMIN (GLUCOPHAGE) 1000 MG tablet, TAKE 1 TABLET (1,000 MG TOTAL) BY MOUTH 2 (TWO) TIMES DAILY WITH A MEAL., Disp: 180 tablet, Rfl: 1   Multiple Vitamins-Minerals (CENTRUM SILVER ADULT 50+  PO), Take 1 tablet by mouth daily., Disp: , Rfl:    ondansetron (ZOFRAN ODT) 4 MG disintegrating tablet, Take 1 tablet (4 mg total) by mouth every 8 (eight) hours as needed. (Patient taking differently: Take 4 mg by mouth daily.), Disp: 20 tablet, Rfl: 1   pantoprazole (PROTONIX) 20 MG tablet, TAKE 1 TABLET (20 MG TOTAL) BY MOUTH 2 (TWO) TIMES DAILY BEFORE A MEAL. (Patient taking differently: Take 20 mg by mouth daily.), Disp: 60 tablet, Rfl: 3   Semaglutide, 1 MG/DOSE, (OZEMPIC, 1 MG/DOSE,) 4 MG/3ML SOPN, Inject 1 mg into the skin once a week., Disp: 9 mL, Rfl: 1   Family History  Problem Relation Age of Onset   Congestive Heart Failure Mother    Colon cancer Father    Esophageal cancer Maternal Uncle    Thyroid cancer Neg Hx      Social History   Tobacco Use   Smoking status: Never   Smokeless tobacco: Never  Vaping Use   Vaping Use: Never used   Substance Use Topics   Alcohol use: Not Currently   Drug use: Never    Allergies as of 01/06/2022 - Review Complete 01/06/2022  Allergen Reaction Noted   Pneumovax 23 [pneumococcal vac polyvalent] Swelling 05/18/2021   Prevnar 13 [pneumococcal 13-val conj vacc] Swelling 05/16/2021    Review of Systems:    All systems reviewed and negative except where noted in HPI.   Physical Exam:  BP 122/78 (BP Location: Left Arm, Patient Position: Sitting, Cuff Size: Normal)    Pulse 83    Temp 97.8 F (36.6 C) (Oral)    Ht 6' (1.829 m)    Wt 189 lb (85.7 kg)    BMI 25.63 kg/m  No LMP recorded. Patient has had a hysterectomy.  General:   Alert,  Well-developed, well-nourished, pleasant and cooperative in NAD Head:  Normocephalic and atraumatic. Eyes:  Sclera clear, no icterus.   Conjunctiva pink. Ears:  Normal auditory acuity. Nose:  No deformity, discharge, or lesions. Mouth:  No deformity or lesions,oropharynx pink & moist. Neck:  Supple; no masses or thyromegaly. Lungs:  Respirations even and unlabored.  Clear throughout to auscultation.   No wheezes, crackles, or rhonchi. No acute distress. Heart:  Regular rate and rhythm; no murmurs, clicks, rubs, or gallops. Abdomen:  Normal bowel sounds. Soft, non-tender and non-distended without masses, hepatosplenomegaly or hernias noted.  No guarding or rebound tenderness.   Rectal: Not performed Msk:  Symmetrical without gross deformities. Good, equal movement & strength bilaterally. Pulses:  Normal pulses noted. Extremities:  No clubbing or edema.  No cyanosis. Neurologic:  Alert and oriented x3;  grossly normal neurologically. Skin:  Intact without significant lesions or rashes. No jaundice. Psych:  Alert and cooperative. Normal mood and affect.  Imaging Studies: No abdominal imaging  Assessment and Plan:   Danielle Dickerson is a 66 y.o. pleasant Caucasian female with metabolic syndrome, family history of colon cancer in father at age 4, ?   Eosinophilic esophagitis, esophageal stricture s/p dilation with no recurrence of dysphagia is seen in for follow-up of pernicious anemia.  Patient's upper abdominal pain has resolved with treatment of nephrolithiasis.  Pernicious anemia: EGD with gastric biopsies are unremarkable Positive intrinsic factor antibodies Recheck CBC, iron panel, B12 levels today If persistently anemic with iron deficiency, recommend referral to hematology for parenteral iron therapy  Family history of colon cancer in first-degree relative Recommend screening colonoscopy in 01/2026  Follow up as needed   Ikran Patman R  Marius Ditch, MD

## 2022-01-07 LAB — CBC
Hematocrit: 34.7 % (ref 34.0–46.6)
Hemoglobin: 11.4 g/dL (ref 11.1–15.9)
MCH: 27.6 pg (ref 26.6–33.0)
MCHC: 32.9 g/dL (ref 31.5–35.7)
MCV: 84 fL (ref 79–97)
Platelets: 194 10*3/uL (ref 150–450)
RBC: 4.13 x10E6/uL (ref 3.77–5.28)
RDW: 13.1 % (ref 11.7–15.4)
WBC: 6.2 10*3/uL (ref 3.4–10.8)

## 2022-01-07 LAB — VITAMIN B12: Vitamin B-12: 368 pg/mL (ref 232–1245)

## 2022-01-07 LAB — IRON,TIBC AND FERRITIN PANEL
Ferritin: 34 ng/mL (ref 15–150)
Iron Saturation: 18 % (ref 15–55)
Iron: 53 ug/dL (ref 27–139)
Total Iron Binding Capacity: 300 ug/dL (ref 250–450)
UIBC: 247 ug/dL (ref 118–369)

## 2022-01-15 ENCOUNTER — Other Ambulatory Visit: Payer: Self-pay | Admitting: Family

## 2022-01-22 ENCOUNTER — Ambulatory Visit (INDEPENDENT_AMBULATORY_CARE_PROVIDER_SITE_OTHER): Payer: Medicare HMO | Admitting: Family

## 2022-01-22 ENCOUNTER — Encounter: Payer: Self-pay | Admitting: Family

## 2022-01-22 ENCOUNTER — Other Ambulatory Visit: Payer: Self-pay

## 2022-01-22 VITALS — BP 140/78 | HR 78 | Temp 97.5°F | Ht 72.0 in | Wt 187.4 lb

## 2022-01-22 DIAGNOSIS — E119 Type 2 diabetes mellitus without complications: Secondary | ICD-10-CM | POA: Diagnosis not present

## 2022-01-22 DIAGNOSIS — E785 Hyperlipidemia, unspecified: Secondary | ICD-10-CM | POA: Diagnosis not present

## 2022-01-22 DIAGNOSIS — I10 Essential (primary) hypertension: Secondary | ICD-10-CM

## 2022-01-22 LAB — COMPREHENSIVE METABOLIC PANEL
ALT: 13 U/L (ref 0–35)
AST: 14 U/L (ref 0–37)
Albumin: 4.4 g/dL (ref 3.5–5.2)
Alkaline Phosphatase: 67 U/L (ref 39–117)
BUN: 14 mg/dL (ref 6–23)
CO2: 34 mEq/L — ABNORMAL HIGH (ref 19–32)
Calcium: 9.6 mg/dL (ref 8.4–10.5)
Chloride: 101 mEq/L (ref 96–112)
Creatinine, Ser: 0.85 mg/dL (ref 0.40–1.20)
GFR: 71.72 mL/min (ref >60.00)
Glucose, Bld: 181 mg/dL — ABNORMAL HIGH (ref 70–99)
Potassium: 3.5 mEq/L (ref 3.5–5.1)
Sodium: 138 mEq/L (ref 135–145)
Total Bilirubin: 0.4 mg/dL (ref 0.2–1.2)
Total Protein: 6.6 g/dL (ref 6.0–8.3)

## 2022-01-22 LAB — MICROALBUMIN / CREATININE URINE RATIO
Creatinine,U: 159.8 mg/dL
Microalb Creat Ratio: 2 mg/g (ref 0.0–30.0)
Microalb, Ur: 3.2 mg/dL — ABNORMAL HIGH (ref 0.0–1.9)

## 2022-01-22 LAB — POCT GLYCOSYLATED HEMOGLOBIN (HGB A1C): Hemoglobin A1C: 7 % — AB (ref 4.0–5.6)

## 2022-01-22 NOTE — Assessment & Plan Note (Addendum)
Lab Results  Component Value Date   HGBA1C 7.0 (A) 01/22/2022   Increased. Patient prefers to focus on lifestyle intervention versus increasing Ozempic.  Continue Ozempic 1 mg, metformin 1000 mg twice daily.  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  Advised over-the-counter capsaicin if symptoms were to recur and certainly let me know

## 2022-01-22 NOTE — Assessment & Plan Note (Signed)
Elevated today. At home , better control.  Advised low threshold to increase lisinopril to 5 mg particularly microalbumin has not decreased

## 2022-01-22 NOTE — Assessment & Plan Note (Signed)
LDL less than 70.  Continue Lipitor40mg 

## 2022-01-22 NOTE — Patient Instructions (Addendum)
Capsaicin ointment for left foot pain as sounds suspicious for neuropathy  Nice to see you!

## 2022-01-22 NOTE — Progress Notes (Signed)
Subjective:    Patient ID: [id number] ALCOSER, female    DOB: 13-Jul-1956, 66 y.o.   MRN: [id number]  CC: ETHER GOEBEL is a 66 y.o. female who presents today for follow up.   HPI: Feels well today She continues to ride stationary bike 3-4 times per week and walk daily.   HLD-compliant with Lipitor Proteinuria-compliant with lisinopril 2.5 mg   Hemoglobin normalized 2 weeks ago.  She is compliant with ferrous sulfate 325 mg qd DM-compliant with Ozempic 1 mg , increased 4 weeks ago, metformin 1000 mg twice daily.  Previously had been on glipizide 5 mg. She is tolerating ozempic without  nausea. Previously had a pain in left foot toes at night. No movement. Since resolved.   Hypertension-compliant with lisinopril 2.5 mg. No sob, cp. Dizziness resolved.  At home 128-130/70.  Consult-Dr Marius Ditch 01/06/2022 for pernicious anemia     HISTORY:  Past Medical History:  Diagnosis Date   COVID-19    05/27/21   Depression    Diabetes mellitus without complication ([place])    Type II   Eosinophilic esophagitis    Esophageal stricture 2019   GERD (gastroesophageal reflux disease)    Hyperlipidemia    Pernicious anemia    Past Surgical History:  Procedure Laterality Date   BREAST EXCISIONAL BIOPSY Right 2009   phyllodes removed    BREAST SURGERY     CESAREAN SECTION     CHOLECYSTECTOMY OPEN     COLONOSCOPY WITH PROPOFOL N/A 02/02/2021   Procedure: COLONOSCOPY WITH PROPOFOL;  Surgeon: Lin Landsman, MD;  Location: [place];  Service: Gastroenterology;  Laterality: N/A;   CYSTOSCOPY WITH STENT PLACEMENT Right 09/07/2021   Procedure: CYSTOSCOPY WITH STENT PLACEMENT; WITH RETROGRADE PYELOGRAM;  Surgeon: Hollice Espy, MD;  Location: [facility] ORS;  Service: Urology;  Laterality: Right;   CYSTOSCOPY/RETROGRADE/URETEROSCOPY/STONE EXTRACTION WITH BASKET  09/21/2021   Procedure: CYSTOSCOPY/RETROGRADE/URETEROSCOPY/STONE EXTRACTION WITH BASKET;  Surgeon: Hollice Espy, MD;  Location: [facility]  ORS;  Service: Urology;;   ESOPHAGOGASTRODUODENOSCOPY (EGD) WITH PROPOFOL N/A 10/07/2021   Procedure: ESOPHAGOGASTRODUODENOSCOPY (EGD) WITH PROPOFOL;  Surgeon: Lin Landsman, MD;  Location: [facility] ENDOSCOPY;  Service: Gastroenterology;  Laterality: N/A;   SUPRACERVICAL ABDOMINAL HYSTERECTOMY  2006   has ovaries. Had heavy periods and was anemic. HAS cervix   TONSILLECTOMY  1966   Family History  Problem Relation Age of Onset   Congestive Heart Failure Mother    Colon cancer Father    Esophageal cancer Maternal Uncle    Thyroid cancer Neg Hx     Allergies: Pneumovax 23 [pneumococcal vac polyvalent] and Prevnar 13 [pneumococcal 13-val conj vacc] Current Outpatient Medications on File Prior to Visit  Medication Sig Dispense Refill   Accu-Chek FastClix Lancets MISC Used to check blood sugars twice a day. 100 each 3   aspirin EC 81 MG tablet Take 81 mg by mouth daily.     atorvastatin (LIPITOR) 40 MG tablet TAKE 1 TABLET BY MOUTH EVERY DAY 90 tablet 3   Blood Glucose Monitoring Suppl (ACCU-CHEK GUIDE ME) w/Device KIT USE AS DIRECTED 1 kit 0   Calcium-Vitamin D-Vitamin K (VIACTIV CALCIUM PLUS D PO) Take 1 tablet by mouth daily.     citalopram (CELEXA) 20 MG tablet TAKE 1 TABLET BY MOUTH EVERY DAY 90 tablet 2   ferrous sulfate 325 (65 FE) MG EC tablet Take 325 mg by mouth 3 (three) times daily with meals.     glucose blood (ACCU-CHEK AVIVA PLUS) test strip Used to check blood sugars  twice a day. 100 each 5   Insulin Pen Needle (PEN NEEDLES) 32G X 4 MM MISC Used to give Victoza injections. 100 each 1   Lancets (ONETOUCH ULTRASOFT) lancets Used to check blood sugars once a day. 100 each 4   lisinopril (ZESTRIL) 2.5 MG tablet Take 1 tablet (2.5 mg total) by mouth daily.     metFORMIN (GLUCOPHAGE) 1000 MG tablet TAKE 1 TABLET (1,000 MG TOTAL) BY MOUTH 2 (TWO) TIMES DAILY WITH A MEAL. 180 tablet 1   Multiple Vitamins-Minerals (CENTRUM SILVER ADULT 50+ PO) Take 1 tablet by mouth daily.      ondansetron (ZOFRAN ODT) 4 MG disintegrating tablet Take 1 tablet (4 mg total) by mouth every 8 (eight) hours as needed. (Patient taking differently: Take 4 mg by mouth daily.) 20 tablet 1   pantoprazole (PROTONIX) 20 MG tablet TAKE 1 TABLET (20 MG TOTAL) BY MOUTH 2 (TWO) TIMES DAILY BEFORE A MEAL. (Patient taking differently: Take 20 mg by mouth daily.) 60 tablet 3   Semaglutide, 1 MG/DOSE, (OZEMPIC, 1 MG/DOSE,) 4 MG/3ML SOPN Inject 1 mg into the skin once a week. 9 mL 1   famotidine (PEPCID) 20 MG tablet Take 20 mg by mouth at bedtime. (Patient not taking: Reported on 01/21/2022)     No current facility-administered medications on file prior to visit.    Social History   Tobacco Use   Smoking status: Never   Smokeless tobacco: Never  Vaping Use   Vaping Use: Never used  Substance Use Topics   Alcohol use: Not Currently   Drug use: Never    Review of Systems  Constitutional:  Negative for chills and fever.  Respiratory:  Negative for cough.   Cardiovascular:  Negative for chest pain and palpitations.  Gastrointestinal:  Negative for nausea and vomiting.     Objective:    BP 140/78 (BP Location: Left Arm, Patient Position: Sitting, Cuff Size: Normal)    Pulse 78    Temp (!) 97.5 F (36.4 C) (Oral)    Ht 6' (1.829 m)    Wt 187 lb 6.4 oz (85 kg)    SpO2 98%    BMI 25.42 kg/m  BP Readings from Last 3 Encounters:  01/22/22 140/78  01/06/22 122/78  10/26/21 124/72   Wt Readings from Last 3 Encounters:  01/22/22 187 lb 6.4 oz (85 kg)  01/06/22 189 lb (85.7 kg)  10/26/21 195 lb 9.6 oz (88.7 kg)    Physical Exam Vitals reviewed.  Constitutional:      Appearance: She is well-developed.  Eyes:     Conjunctiva/sclera: Conjunctivae normal.  Cardiovascular:     Rate and Rhythm: Normal rate and regular rhythm.     Pulses: Normal pulses.     Heart sounds: Normal heart sounds.  Pulmonary:     Effort: Pulmonary effort is normal.     Breath sounds: Normal breath sounds. No  wheezing, rhonchi or rales.  Skin:    General: Skin is warm and dry.  Neurological:     Mental Status: She is alert.  Psychiatric:        Speech: Speech normal.        Behavior: Behavior normal.        Thought Content: Thought content normal.       Assessment & Plan:   Problem List Items Addressed This Visit       Cardiovascular and Mediastinum   HTN (hypertension)    Elevated today. At home , better control.  Advised  low threshold to increase lisinopril to 5 mg particularly microalbumin has not decreased        Endocrine   DM (diabetes mellitus) ([place]) - Primary    Lab Results  Component Value Date   HGBA1C 7.0 (A) 01/22/2022  Increased. Patient prefers to focus on lifestyle intervention versus increasing Ozempic.  Continue Ozempic 1 mg, metformin 1000 mg twice daily.  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  Advised over-the-counter capsaicin if symptoms were to recur and certainly let me know      Relevant Orders   POCT HgB A1C (Completed)   Microalbumin / creatinine urine ratio   Comprehensive metabolic panel     Other   HLD (hyperlipidemia)    LDL less than 70.  Continue Lipitor75m        I am having LRoanna Reaves Klingerman maintain her Multiple Vitamins-Minerals (CENTRUM SILVER ADULT 50+ PO), aspirin EC, onetouch ultrasoft, Pen Needles, Calcium-Vitamin D-Vitamin K (VIACTIV CALCIUM PLUS D PO), Accu-Chek Aviva Plus, Accu-Chek FastClix Lancets, Accu-Chek Guide Me, metFORMIN, ondansetron, famotidine, pantoprazole, lisinopril, atorvastatin, Ozempic (1 MG/DOSE), citalopram, and ferrous sulfate.   No orders of the defined types were placed in this encounter.   Return precautions given.   Risks, benefits, and alternatives of the medications and treatment plan prescribed today were discussed, and patient expressed understanding.   Education regarding symptom management and diagnosis given to patient on  AVS.  Continue to follow with ABurnard Hawthorne FNP for routine health maintenance.   LRoosvelt Harpsand I agreed with plan.   MMable Paris FNP

## 2022-01-25 ENCOUNTER — Other Ambulatory Visit: Payer: Self-pay | Admitting: Family

## 2022-01-25 DIAGNOSIS — E119 Type 2 diabetes mellitus without complications: Secondary | ICD-10-CM

## 2022-01-25 DIAGNOSIS — I10 Essential (primary) hypertension: Secondary | ICD-10-CM

## 2022-01-25 MED ORDER — LISINOPRIL 5 MG PO TABS: 5.0000 mg | ORAL_TABLET | Freq: Every day | ORAL | 1 refills | Status: DC

## 2022-01-27 ENCOUNTER — Other Ambulatory Visit: Payer: Self-pay

## 2022-01-27 ENCOUNTER — Ambulatory Visit (INDEPENDENT_AMBULATORY_CARE_PROVIDER_SITE_OTHER): Payer: Medicare HMO | Admitting: *Deleted

## 2022-01-27 DIAGNOSIS — E538 Deficiency of other specified B group vitamins: Secondary | ICD-10-CM

## 2022-01-27 MED ORDER — CYANOCOBALAMIN 1000 MCG/ML IJ SOLN
1000.0000 ug | Freq: Once | INTRAMUSCULAR | Status: AC
  Administered 2022-01-27: 1000 ug via INTRAMUSCULAR

## 2022-01-27 NOTE — Progress Notes (Signed)
Pt arrived for B12 injection, given in L deltoid. Pt tolerated injection well, showed no signs of distress nor voiced any concerns.  ?

## 2022-01-28 ENCOUNTER — Encounter: Payer: Self-pay | Admitting: Family

## 2022-02-25 ENCOUNTER — Other Ambulatory Visit: Payer: Self-pay | Admitting: Family

## 2022-02-25 DIAGNOSIS — Z0000 Encounter for general adult medical examination without abnormal findings: Secondary | ICD-10-CM

## 2022-03-01 ENCOUNTER — Ambulatory Visit (INDEPENDENT_AMBULATORY_CARE_PROVIDER_SITE_OTHER): Payer: Medicare HMO

## 2022-03-01 DIAGNOSIS — E538 Deficiency of other specified B group vitamins: Secondary | ICD-10-CM | POA: Diagnosis not present

## 2022-03-01 MED ORDER — CYANOCOBALAMIN 1000 MCG/ML IJ SOLN
1000.0000 ug | Freq: Once | INTRAMUSCULAR | Status: AC
  Administered 2022-03-01: 1000 ug via INTRAMUSCULAR

## 2022-03-01 NOTE — Progress Notes (Signed)
Pt arrived for B12 injection, given in R deltoid. Pt tolerated injection well, showed no signs of distress nor voiced any concerns.  

## 2022-03-09 ENCOUNTER — Telehealth: Payer: Self-pay

## 2022-03-09 NOTE — Telephone Encounter (Signed)
LMTCB office to discuss PA for Ozempic ?

## 2022-03-25 ENCOUNTER — Telehealth: Payer: Self-pay

## 2022-03-25 NOTE — Telephone Encounter (Signed)
Left message for patient to come in 30 mins early for appointment if possible ?

## 2022-03-26 ENCOUNTER — Ambulatory Visit (INDEPENDENT_AMBULATORY_CARE_PROVIDER_SITE_OTHER): Payer: Medicare HMO | Admitting: Family

## 2022-03-26 ENCOUNTER — Encounter: Payer: Self-pay | Admitting: Family

## 2022-03-26 VITALS — BP 130/78 | HR 76 | Temp 97.5°F | Ht 72.0 in | Wt 182.2 lb

## 2022-03-26 DIAGNOSIS — E119 Type 2 diabetes mellitus without complications: Secondary | ICD-10-CM | POA: Diagnosis not present

## 2022-03-26 DIAGNOSIS — I10 Essential (primary) hypertension: Secondary | ICD-10-CM | POA: Diagnosis not present

## 2022-03-26 DIAGNOSIS — Z0000 Encounter for general adult medical examination without abnormal findings: Secondary | ICD-10-CM | POA: Diagnosis not present

## 2022-03-26 DIAGNOSIS — Z136 Encounter for screening for cardiovascular disorders: Secondary | ICD-10-CM | POA: Diagnosis not present

## 2022-03-26 DIAGNOSIS — E785 Hyperlipidemia, unspecified: Secondary | ICD-10-CM

## 2022-03-26 DIAGNOSIS — H9193 Unspecified hearing loss, bilateral: Secondary | ICD-10-CM | POA: Diagnosis not present

## 2022-03-26 NOTE — Patient Instructions (Signed)
Referral to ENT and cardiology as discussed ?Let us know if you dont hear back within a week in regards to an appointment being scheduled.  ? ?Please bring living will documents to be scanned in office ? ?Schedule eye exam ? ?Please call  and schedule your 3D mammogram and bone density scan as we discussed. ? ? [facility]  ( new location in 2023) ? ?[address], [place], [place] [address] ? ?[place], [place]  ?[phone] ? ?Health Maintenance for Postmenopausal Women ?Menopause is a normal process in which your ability to get pregnant comes to an end. This process happens slowly over many months or years, usually between the ages of 108 and 28. Menopause is complete when you have missed your menstrual period for 12 months. ?It is important to talk with your health care provider about some of the most common conditions that affect women after menopause (postmenopausal women). These include heart disease, cancer, and bone loss (osteoporosis). Adopting a healthy lifestyle and getting preventive care can help to promote your health and wellness. The actions you take can also lower your chances of developing some of these common conditions. ?What are the signs and symptoms of menopause? ?During menopause, you may have the following symptoms: ?Hot flashes. These can be moderate or severe. ?Night sweats. ?Decrease in sex drive. ?Mood swings. ?Headaches. ?Tiredness (fatigue). ?Irritability. ?Memory problems. ?Problems falling asleep or staying asleep. ?Talk with your health care provider about treatment options for your symptoms. ?Do I need hormone replacement therapy? ?Hormone replacement therapy is effective in treating symptoms that are caused by menopause, such as hot flashes and night sweats. ?Hormone replacement carries certain risks, especially as you become older. If you are thinking about using estrogen or estrogen with progestin, discuss the benefits and risks with your health care  provider. ?How can I reduce my risk for heart disease and stroke? ?The risk of heart disease, heart attack, and stroke increases as you age. One of the causes may be a change in the body's hormones during menopause. This can affect how your body uses dietary fats, triglycerides, and cholesterol. Heart attack and stroke are medical emergencies. There are many things that you can do to help prevent heart disease and stroke. ?Watch your blood pressure ?High blood pressure causes heart disease and increases the risk of stroke. This is more likely to develop in people who have high blood pressure readings or are overweight. ?Have your blood pressure checked: ?Every 3-5 years if you are 65-26 years of age. ?Every year if you are 9 years old or older. ?Eat a healthy diet ? ?Eat a diet that includes plenty of vegetables, fruits, low-fat dairy products, and lean protein. ?Do not eat a lot of foods that are high in solid fats, added sugars, or sodium. ?Get regular exercise ?Get regular exercise. This is one of the most important things you can do for your health. Most adults should: ?Try to exercise for at least 150 minutes each week. The exercise should increase your heart rate and make you sweat (moderate-intensity exercise). ?Try to do strengthening exercises at least twice each week. Do these in addition to the moderate-intensity exercise. ?Spend less time sitting. Even light physical activity can be beneficial. ?Other tips ?Work with your health care provider to achieve or maintain a healthy weight. ?Do not use any products that contain nicotine or tobacco. These products include cigarettes, chewing tobacco, and vaping devices, such as e-cigarettes. If you need help quitting, ask your  health care provider. ?Know your numbers. Ask your health care provider to check your cholesterol and your blood sugar (glucose). Continue to have your blood tested as directed by your health care provider. ?Do I need screening for  cancer? ?Depending on your health history and family history, you may need to have cancer screenings at different stages of your life. This may include screening for: ?Breast cancer. ?Cervical cancer. ?Lung cancer. ?Colorectal cancer. ?What is my risk for osteoporosis? ?After menopause, you may be at increased risk for osteoporosis. Osteoporosis is a condition in which bone destruction happens more quickly than new bone creation. To help prevent osteoporosis or the bone fractures that can happen because of osteoporosis, you may take the following actions: ?If you are 18-34 years old, get at least 1,000 mg of calcium and at least 600 international units (IU) of vitamin D per day. ?If you are older than age 64 but younger than age 62, get at least 1,200 mg of calcium and at least 600 international units (IU) of vitamin D per day. ?If you are older than age 52, get at least 1,200 mg of calcium and at least 800 international units (IU) of vitamin D per day. ?Smoking and drinking excessive alcohol increase the risk of osteoporosis. Eat foods that are rich in calcium and vitamin D, and do weight-bearing exercises several times each week as directed by your health care provider. ?How does menopause affect my mental health? ?Depression may occur at any age, but it is more common as you become older. Common symptoms of depression include: ?Feeling depressed. ?Changes in sleep patterns. ?Changes in appetite or eating patterns. ?Feeling an overall lack of motivation or enjoyment of activities that you previously enjoyed. ?Frequent crying spells. ?Talk with your health care provider if you think that you are experiencing any of these symptoms. ?General instructions ?See your health care provider for regular wellness exams and vaccines. This may include: ?Scheduling regular health, dental, and eye exams. ?Getting and maintaining your vaccines. These include: ?Influenza vaccine. Get this vaccine each year before the flu season  begins. ?Pneumonia vaccine. ?Shingles vaccine. ?Tetanus, diphtheria, and pertussis (Tdap) booster vaccine. ?Your health care provider may also recommend other immunizations. ?Tell your health care provider if you have ever been abused or do not feel safe at home. ?Summary ?Menopause is a normal process in which your ability to get pregnant comes to an end. ?This condition causes hot flashes, night sweats, decreased interest in sex, mood swings, headaches, or lack of sleep. ?Treatment for this condition may include hormone replacement therapy. ?Take actions to keep yourself healthy, including exercising regularly, eating a healthy diet, watching your weight, and checking your blood pressure and blood sugar levels. ?Get screened for cancer and depression. Make sure that you are up to date with all your vaccines. ?This information is not intended to replace advice given to you by your health care provider. Make sure you discuss any questions you have with your health care provider. ?Document Revised: 04/06/2021 Document Reviewed: 04/06/2021 ?Elsevier Patient Education ? 2023 Elsevier Inc. ? ?

## 2022-03-26 NOTE — Assessment & Plan Note (Signed)
GAD 7, Mini cog, vision screen.  ?

## 2022-03-26 NOTE — Progress Notes (Addendum)
? ?Welcome to medicare initial visit ? ?  ? ?Patient: Danielle Dickerson, Female    DOB: Nov 29, 1956, 66 y.o.   MRN: [id number] ? ?Subjective  ?Chief Complaint  ?Patient presents with  ? Medicare Wellness  ? ? ?Danielle Dickerson is a 66 y.o. female who presents today for her Annual Wellness Visit. ? ?Feels well today ?No new concerns ?Walking daily for 45 minutes. Doing peloton bike 3-4 times per week.  She follows a healthy diet.  No cp, sob. ?She has no functional or cognitive impairments. She feels safe at home.  ?No personal of family of ASCVD.  ? ?Primary care team:  ?Mable Paris, PCP ?Dr Leafy Ro, OB GYN ?Dr Marius Ditch, GI ?Dr Erlene Quan , GU ? ? ? ? ? ? ? ? ?HTN- compliant with lisinopril 53m.  ?HLD- compliant with lipitor 446m?DM- compliant with ozempic 35m88mmetformin 1000m735md ? ?She has living will and she thinks she has HCPOA at home.  ? ?She will schedule  DM eye exam.  ? ?She has noticed both ears harder to hear. She would like formal hearing test.  ? ? ? ?Colorectal Cancer Screening: UTD , Dr VangMarius Ditcheast Cancer Screening: Mammogram UTD ? ?Cervical Cancer Screening: LSIL 2021. 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 Due for follow up 03/2022. She has an appointment with Dr BeasLeafy Ro0/23 ? ?Bone Health screening/DEXA for 65+:due ? ?Lung Cancer Screening: Doesn't have 20 year pack year history and age > 50 y4rs yo 80 y42rs ? ? ?      Tetanus - UTD ?       Pneumococcal - Candidate for PCV20 but declines PCV due to previous reaction. ? ?Labs: Screening labs ; she declines labs today and prefers to do at follow up appointment.  ?  ?No history of substance use disorder.  Never smoker.  No alcohol use ? ?Medications: ?Outpatient Medications Prior to Visit  ?Medication Sig  ? Accu-Chek FastClix Lancets MISC Used to check blood sugars twice a day.  ? aspirin EC 81 MG tablet Take 81 mg by mouth daily.  ? atorvastatin (LIPITOR) 40 MG tablet TAKE 1 TABLET BY MOUTH EVERY DAY  ? Blood Glucose Monitoring  Suppl (ACCU-CHEK GUIDE ME) w/Device KIT USE AS DIRECTED  ? Calcium-Vitamin D-Vitamin K (VIACTIV CALCIUM PLUS D PO) Take 1 tablet by mouth daily.  ? citalopram (CELEXA) 20 MG tablet TAKE 1 TABLET BY MOUTH EVERY DAY  ? ferrous sulfate 325 (65 FE) MG EC tablet Take 325 mg by mouth 3 (three) times daily with meals.  ? glucose blood (ACCU-CHEK AVIVA PLUS) test strip Used to check blood sugars twice a day.  ? Insulin Pen Needle (PEN NEEDLES) 32G X 4 MM MISC Used to give Victoza injections.  ? Lancets (ONETOUCH ULTRASOFT) lancets Used to check blood sugars once a day.  ? lisinopril (ZESTRIL) 5 MG tablet Take 1 tablet (5 mg total) by mouth daily.  ? metFORMIN (GLUCOPHAGE) 1000 MG tablet TAKE 1 TABLET (1,000 MG TOTAL) BY MOUTH 2 (TWO) TIMES DAILY WITH A MEAL.  ? Multiple Vitamins-Minerals (CENTRUM SILVER ADULT 50+ PO) Take 1 tablet by mouth daily.  ? ondansetron (ZOFRAN ODT) 4 MG disintegrating tablet Take 1 tablet (4 mg total) by mouth every 8 (eight) hours as needed. (Patient taking differently: Take 4 mg by mouth daily.)  ? pantoprazole (PROTONIX) 20 MG tablet TAKE 1 TABLET (20 MG TOTAL) BY MOUTH 2 (TWO) TIMES DAILY BEFORE A MEAL. (Patient taking differently: Take 20 mg  by mouth daily.)  ? Semaglutide, 1 MG/DOSE, (OZEMPIC, 1 MG/DOSE,) 4 MG/3ML SOPN Inject 1 mg into the skin once a week.  ? famotidine (PEPCID) 20 MG tablet Take 20 mg by mouth at bedtime. (Patient not taking: Reported on 01/21/2022)  ? ?No facility-administered medications prior to visit.  ?  ?Allergies  ?Allergen Reactions  ? Pneumovax 23 [Pneumococcal Vac Polyvalent] Swelling  ?  Causes arm swelling, redness.  ? Prevnar 13 [Pneumococcal 13-Val Conj Vacc] Swelling  ? ? ?Patient Care Team: ?Burnard Hawthorne, FNP as PCP - General (Family Medicine) ? ?Review of Systems  ?Constitutional:  Negative for fever.  ?HENT:  Negative for congestion.   ?Respiratory:  Negative for cough.   ?Cardiovascular:  Negative for chest pain.  ?Gastrointestinal:  Negative for  constipation and diarrhea.  ?Genitourinary:  Negative for dysuria.  ?Skin:  Negative for rash.  ?Neurological:  Negative for headaches.  ?Psychiatric/Behavioral:  Negative for depression.   ? ? ?  ? ?Objective  ?BP 130/78 (BP Location: Left Arm, Patient Position: Sitting, Cuff Size: Normal)   Pulse 76   Temp (!) 97.5 ?F (36.4 ?C) (Oral)   Ht 6' (1.829 m)   Wt 182 lb 3.2 oz (82.6 kg)   SpO2 98%   BMI 24.71 kg/m?  ?BP Readings from Last 3 Encounters:  ?03/26/22 130/78  ?01/22/22 140/78  ?01/06/22 122/78  ? ?Wt Readings from Last 3 Encounters:  ?03/26/22 182 lb 3.2 oz (82.6 kg)  ?01/22/22 187 lb 6.4 oz (85 kg)  ?01/06/22 189 lb (85.7 kg)  ? ?  ? ?Physical Exam ?Vitals reviewed.  ?Constitutional:   ?   Appearance: Normal appearance. She is well-developed.  ?Eyes:  ?   Conjunctiva/sclera: Conjunctivae normal.  ?Neck:  ?   Thyroid: No thyroid mass or thyromegaly.  ?Cardiovascular:  ?   Rate and Rhythm: Normal rate and regular rhythm.  ?   Pulses: Normal pulses.  ?   Heart sounds: Normal heart sounds.  ?Pulmonary:  ?   Effort: Pulmonary effort is normal.  ?   Breath sounds: Normal breath sounds. No wheezing, rhonchi or rales.  ?Chest:  ?Breasts: ?   Breasts are symmetrical.  ?   Right: No inverted nipple, mass, nipple discharge, skin change or tenderness.  ?   Left: No inverted nipple, mass, nipple discharge, skin change or tenderness.  ?Abdominal:  ?   General: Bowel sounds are normal. There is no distension.  ?   Palpations: Abdomen is soft. Abdomen is not rigid. There is no fluid wave or mass.  ?   Tenderness: There is no abdominal tenderness. There is no guarding or rebound.  ?Lymphadenopathy:  ?   Head:  ?   Right side of head: No submental, submandibular, tonsillar, preauricular, posterior auricular or occipital adenopathy.  ?   Left side of head: No submental, submandibular, tonsillar, preauricular, posterior auricular or occipital adenopathy.  ?   Cervical: No cervical adenopathy.  ?   Right cervical: No  superficial, deep or posterior cervical adenopathy. ?   Left cervical: No superficial, deep or posterior cervical adenopathy.  ?Skin: ?   General: Skin is warm and dry.  ?Neurological:  ?   Mental Status: She is alert.  ?Psychiatric:     ?   Speech: Speech normal.     ?   Behavior: Behavior normal.     ?   Thought Content: Thought content normal.  ? ? ? ? ?Most recent functional status assessment: ? ?  03/26/2022  ?  8:57 AM  ?In your present state of health, do you have any difficulty performing the following activities:  ?Hearing? 0  ?Vision? 0  ?Difficulty concentrating or making decisions? 0  ?Walking or climbing stairs? 0  ?Dressing or bathing? 0  ?Doing errands, shopping? 0  ? ?Most recent fall risk assessment: ? ?  03/26/2022  ?  8:53 AM  ?Fall Risk   ?Falls in the past year? 0  ?Injury with Fall? 0  ?Risk for fall due to : No Fall Risks  ? ? Most recent depression screenings: ? ?  03/26/2022  ?  8:53 AM 01/22/2022  ? 10:48 AM  ?PHQ 2/9 Scores  ?PHQ - 2 Score 0 0  ?PHQ- 9 Score 0 0  ? ? ? ?Vision/Hearing Screen: ?Vision Screening  ? Right eye Left eye Both eyes  ?Without correction 20/30 20/30 20/15   ?With correction 20/20 20/20 20/20   ? ? ? ?No results found for any visits on 03/26/22. ?  ? ?Assessment & Plan  ? ?Annual wellness visit done today including the all of the following: ?Reviewed patient's Family Medical History ?Reviewed and updated list of patient's medical providers ?Assessment of cognitive impairment was done ?Assessed patient's functional ability ?Established a written schedule for health screening services ?Health Risk Assessent Completed and Reviewed ? ?Exercise Activities and Dietary recommendations ?Patient is very diligent to regular exercise.  Advised to continue exercise program ? ? ?  03/26/2022  ?  9:00 AM  ?MMSE - Mini Mental State Exam  ?Orientation to time 5  ?Orientation to Place 5  ?Registration 3  ?Attention/ Calculation 5  ?Recall 3  ?Language- name 2 objects 2  ?Language- repeat 1   ?Language- follow 3 step command 3  ?Language- read & follow direction 1  ?Write a sentence 1  ?Copy design 1  ?Total score 30  ? ? ? ?Immunization History  ?Administered Date(s) Administered  ? Influenza,inj,Quad P

## 2022-03-31 ENCOUNTER — Ambulatory Visit (INDEPENDENT_AMBULATORY_CARE_PROVIDER_SITE_OTHER): Payer: Medicare HMO | Admitting: *Deleted

## 2022-03-31 ENCOUNTER — Encounter: Payer: Self-pay | Admitting: Family

## 2022-03-31 DIAGNOSIS — E538 Deficiency of other specified B group vitamins: Secondary | ICD-10-CM

## 2022-03-31 MED ORDER — CYANOCOBALAMIN 1000 MCG/ML IJ SOLN
1000.0000 ug | Freq: Once | INTRAMUSCULAR | Status: AC
  Administered 2022-03-31: 1000 ug via INTRAMUSCULAR

## 2022-03-31 NOTE — Progress Notes (Signed)
Pt arrived for b12 injection.  ?Tolerated well, with no signs of reaction or distress. ?

## 2022-04-04 DIAGNOSIS — Z0000 Encounter for general adult medical examination without abnormal findings: Secondary | ICD-10-CM | POA: Insufficient documentation

## 2022-04-04 NOTE — Assessment & Plan Note (Signed)
Lab Results  ?Component Value Date  ? HGBA1C 7.0 (A) 01/22/2022  ? ?Patient would like to obtain A1c at follow-up.  Continue Ozempic 1 mg, metformin 1000 mg twice daily ?

## 2022-04-04 NOTE — Assessment & Plan Note (Signed)
Previously controlled .pending lipid panel at follow-up.  Continue Lipitor 40 mg ?Lipid Panel  ?   ?Component Value Date/Time  ? CHOL 107 06/16/2021 0800  ? TRIG 63.0 06/16/2021 0800  ? HDL 44.00 06/16/2021 0800  ? CHOLHDL 2 06/16/2021 0800  ? VLDL 12.6 06/16/2021 0800  ? LDLCALC 50 06/16/2021 0800  ? ? ?

## 2022-04-04 NOTE — Assessment & Plan Note (Signed)
Chronic, stable.  Continue lisinopril 5 mg 

## 2022-04-04 NOTE — Assessment & Plan Note (Addendum)
Medications reviewed, patient not on opioids.  No cognitive impairment assessed by observation. Assessed memory, risk of fall, depression. Counseled on importance of end of life planning by discussion of   healthcare power of attorney ;  she will bring this document to our office so we may add to her record.  Encouraged continued exercise.  She is due for bone density, mammogram and this is now scheduled.   Referral made to ENT for formal audiology testing.  She is due for dilated diabetic eye exam and she will call to make this appointment.  We will obtain diabetic foot exam at follow-up visit.  EKG shows sinus rhythm.  No significant changes compared to prior September 21, 2021 ?Discussed restratification for history of hyperlipidemia and I placed a referral to cardiology to discuss stress test, CT calcium scoring.  She is also due for repeat Pap smear.  Follow-up as scheduled with GYN Dr Dalbert Garnet.  ?

## 2022-04-06 ENCOUNTER — Telehealth: Payer: Self-pay

## 2022-04-06 NOTE — Telephone Encounter (Signed)
Spoke to patient and notified her of her appointment at [facility] ENT on 05/20/22 ?

## 2022-04-08 NOTE — Telephone Encounter (Signed)
Spoke to patient and informed her of appointment on 6/6 with Cardiology. Patient verbalized understanding  ?

## 2022-04-23 ENCOUNTER — Encounter: Payer: Self-pay | Admitting: Family

## 2022-04-23 ENCOUNTER — Ambulatory Visit (INDEPENDENT_AMBULATORY_CARE_PROVIDER_SITE_OTHER): Payer: Medicare HMO | Admitting: Family

## 2022-04-23 VITALS — BP 130/80 | HR 74 | Temp 98.2°F | Ht 72.0 in | Wt 179.4 lb

## 2022-04-23 DIAGNOSIS — Z0000 Encounter for general adult medical examination without abnormal findings: Secondary | ICD-10-CM | POA: Diagnosis not present

## 2022-04-23 DIAGNOSIS — D649 Anemia, unspecified: Secondary | ICD-10-CM

## 2022-04-23 DIAGNOSIS — E785 Hyperlipidemia, unspecified: Secondary | ICD-10-CM

## 2022-04-23 DIAGNOSIS — I10 Essential (primary) hypertension: Secondary | ICD-10-CM

## 2022-04-23 DIAGNOSIS — R110 Nausea: Secondary | ICD-10-CM | POA: Diagnosis not present

## 2022-04-23 DIAGNOSIS — E119 Type 2 diabetes mellitus without complications: Secondary | ICD-10-CM | POA: Diagnosis not present

## 2022-04-23 LAB — CBC WITH DIFFERENTIAL/PLATELET
Basophils Absolute: 0 10*3/uL (ref 0.0–0.1)
Basophils Relative: 0.4 % (ref 0.0–3.0)
Eosinophils Absolute: 0.1 10*3/uL (ref 0.0–0.7)
Eosinophils Relative: 1.8 % (ref 0.0–5.0)
HCT: 32 % — ABNORMAL LOW (ref 36.0–46.0)
Hemoglobin: 11.1 g/dL — ABNORMAL LOW (ref 12.0–15.0)
Lymphocytes Relative: 29.4 % (ref 12.0–46.0)
Lymphs Abs: 1.7 10*3/uL (ref 0.7–4.0)
MCHC: 34.7 g/dL (ref 30.0–36.0)
MCV: 84.7 fl (ref 78.0–100.0)
Monocytes Absolute: 0.3 10*3/uL (ref 0.1–1.0)
Monocytes Relative: 6 % (ref 3.0–12.0)
Neutro Abs: 3.5 10*3/uL (ref 1.4–7.7)
Neutrophils Relative %: 62.4 % (ref 43.0–77.0)
Platelets: 171 10*3/uL (ref 150.0–400.0)
RBC: 3.78 Mil/uL — ABNORMAL LOW (ref 3.87–5.11)
RDW: 13.6 % (ref 11.5–15.5)
WBC: 5.6 10*3/uL (ref 4.0–10.5)

## 2022-04-23 LAB — LIPID PANEL
Cholesterol: 114 mg/dL (ref 0–200)
HDL: 51.5 mg/dL (ref >39.00)
LDL Cholesterol: 51 mg/dL (ref 0–99)
NonHDL: 62.87
Total CHOL/HDL Ratio: 2
Triglycerides: 58 mg/dL (ref 0.0–149.0)
VLDL: 11.6 mg/dL (ref 0.0–40.0)

## 2022-04-23 LAB — COMPREHENSIVE METABOLIC PANEL
ALT: 13 U/L (ref 0–35)
AST: 14 U/L (ref 0–37)
Albumin: 4.3 g/dL (ref 3.5–5.2)
Alkaline Phosphatase: 63 U/L (ref 39–117)
BUN: 19 mg/dL (ref 6–23)
CO2: 29 mEq/L (ref 19–32)
Calcium: 9.7 mg/dL (ref 8.4–10.5)
Chloride: 103 mEq/L (ref 96–112)
Creatinine, Ser: 0.83 mg/dL (ref 0.40–1.20)
GFR: 73.67 mL/min (ref >60.00)
Glucose, Bld: 151 mg/dL — ABNORMAL HIGH (ref 70–99)
Potassium: 4.1 mEq/L (ref 3.5–5.1)
Sodium: 140 mEq/L (ref 135–145)
Total Bilirubin: 0.5 mg/dL (ref 0.2–1.2)
Total Protein: 6.3 g/dL (ref 6.0–8.3)

## 2022-04-23 LAB — POCT GLYCOSYLATED HEMOGLOBIN (HGB A1C): Hemoglobin A1C: 6.7 % — AB (ref 4.0–5.6)

## 2022-04-23 LAB — VITAMIN D 25 HYDROXY (VIT D DEFICIENCY, FRACTURES): VITD: 44.92 ng/mL (ref 30.00–100.00)

## 2022-04-23 LAB — TSH: TSH: 1.47 u[IU]/mL (ref 0.35–5.50)

## 2022-04-23 LAB — MICROALBUMIN / CREATININE URINE RATIO
Creatinine,U: 178.7 mg/dL
Microalb Creat Ratio: 1 mg/g (ref 0.0–30.0)
Microalb, Ur: 1.7 mg/dL (ref 0.0–1.9)

## 2022-04-23 MED ORDER — ONDANSETRON 4 MG PO TBDP: 4.0000 mg | ORAL_TABLET | Freq: Three times a day (TID) | ORAL | 1 refills | Status: DC | PRN

## 2022-04-23 NOTE — Assessment & Plan Note (Signed)
Lab Results  Component Value Date   HGBA1C 6.7 (A) 04/23/2022   Continue Ozempic 1 mg, metformin 1000 mg twice daily

## 2022-04-23 NOTE — Addendum Note (Signed)
Addended by: Marijo Sanes on: 04/23/2022 08:37 AM   Modules accepted: Orders

## 2022-04-23 NOTE — Assessment & Plan Note (Addendum)
Pending lipid panel. Continue lipitor 40mg .

## 2022-04-23 NOTE — Assessment & Plan Note (Signed)
Chronic, stable.  Continue lisinopril 5 mg 

## 2022-04-23 NOTE — Progress Notes (Signed)
Subjective:    Patient ID: Danielle Dickerson, female    DOB: 28-Mar-1956, 66 y.o.   MRN: [id number]  CC: Danielle Dickerson is a 66 y.o. female who presents today for follow up.   HPI: Feels well today  No new complaints  She remains very physically active riding stationary bike every other day, walking multiple times each day  Patient-compliant with lisinopril 5 mg. BP at home well controlled.  Diabetes-compliant with Ozempic 1 mg, metformin 1000 mg twice daily Hyperlipidemia-compliant with Lipitor 40 mg  HISTORY:  Past Medical History:  Diagnosis Date   COVID-19    05/27/21   Depression    Diabetes mellitus without complication (HCC)    Type II   Eosinophilic esophagitis    Esophageal stricture 2019   GERD (gastroesophageal reflux disease)    Hyperlipidemia    Pernicious anemia    Past Surgical History:  Procedure Laterality Date   BREAST EXCISIONAL BIOPSY Right 2009   phyllodes removed    BREAST SURGERY     CESAREAN SECTION     CHOLECYSTECTOMY OPEN     COLONOSCOPY WITH PROPOFOL N/A 02/02/2021   Procedure: COLONOSCOPY WITH PROPOFOL;  Surgeon: Lin Landsman, MD;  Location: [place];  Service: Gastroenterology;  Laterality: N/A;   CYSTOSCOPY WITH STENT PLACEMENT Right 09/07/2021   Procedure: CYSTOSCOPY WITH STENT PLACEMENT; WITH RETROGRADE PYELOGRAM;  Surgeon: Hollice Espy, MD;  Location: [facility];  Service: Urology;  Laterality: Right;   CYSTOSCOPY/RETROGRADE/URETEROSCOPY/STONE EXTRACTION WITH BASKET  09/21/2021   Procedure: CYSTOSCOPY/RETROGRADE/URETEROSCOPY/STONE EXTRACTION WITH BASKET;  Surgeon: Hollice Espy, MD;  Location: [facility];  Service: Urology;;   ESOPHAGOGASTRODUODENOSCOPY (EGD) WITH PROPOFOL N/A 10/07/2021   Procedure: ESOPHAGOGASTRODUODENOSCOPY (EGD) WITH PROPOFOL;  Surgeon: Lin Landsman, MD;  Location: [facility];  Service: Gastroenterology;  Laterality: N/A;   SUPRACERVICAL ABDOMINAL HYSTERECTOMY  2006   has ovaries. Had heavy  periods and was anemic. HAS cervix   TONSILLECTOMY  1966   Family History  Problem Relation Age of Onset   Congestive Heart Failure Mother    CVA Mother 81   Colon cancer Father    Esophageal cancer Maternal Uncle    Thyroid cancer Neg Hx     Allergies: Pneumovax 23 [pneumococcal vac polyvalent] and Prevnar 13 [pneumococcal 13-val conj vacc] Current Outpatient Medications on File Prior to Visit  Medication Sig Dispense Refill   Accu-Chek FastClix Lancets MISC Used to check blood sugars twice a day. 100 each 3   aspirin EC 81 MG tablet Take 81 mg by mouth daily.     atorvastatin (LIPITOR) 40 MG tablet TAKE 1 TABLET BY MOUTH EVERY DAY 90 tablet 3   Blood Glucose Monitoring Suppl (ACCU-CHEK GUIDE ME) w/Device KIT USE AS DIRECTED 1 kit 0   Calcium-Vitamin D-Vitamin K (VIACTIV CALCIUM PLUS D PO) Take 1 tablet by mouth daily.     citalopram (CELEXA) 20 MG tablet TAKE 1 TABLET BY MOUTH EVERY DAY 90 tablet 2   famotidine (PEPCID) 20 MG tablet Take 20 mg by mouth at bedtime.     ferrous sulfate 325 (65 FE) MG EC tablet Take 325 mg by mouth 3 (three) times daily with meals.     glucose blood (ACCU-CHEK AVIVA PLUS) test strip Used to check blood sugars twice a day. 100 each 5   Insulin Pen Needle (PEN NEEDLES) 32G X 4 MM MISC Used to give Victoza injections. 100 each 1   Lancets (ONETOUCH ULTRASOFT) lancets Used to check blood sugars once a day. 100  each 4   lisinopril (ZESTRIL) 5 MG tablet Take 1 tablet (5 mg total) by mouth daily. 90 tablet 1   metFORMIN (GLUCOPHAGE) 1000 MG tablet TAKE 1 TABLET (1,000 MG TOTAL) BY MOUTH 2 (TWO) TIMES DAILY WITH A MEAL. 180 tablet 1   Multiple Vitamins-Minerals (CENTRUM SILVER ADULT 50+ PO) Take 1 tablet by mouth daily.     pantoprazole (PROTONIX) 20 MG tablet TAKE 1 TABLET (20 MG TOTAL) BY MOUTH 2 (TWO) TIMES DAILY BEFORE A MEAL. (Patient taking differently: Take 20 mg by mouth daily.) 60 tablet 3   Semaglutide, 1 MG/DOSE, (OZEMPIC, 1 MG/DOSE,) 4 MG/3ML SOPN  Inject 1 mg into the skin once a week. 9 mL 1   No current facility-administered medications on file prior to visit.    Social History   Tobacco Use   Smoking status: Never   Smokeless tobacco: Never  Vaping Use   Vaping Use: Never used  Substance Use Topics   Alcohol use: Not Currently   Drug use: Never    Review of Systems  Constitutional:  Negative for chills and fever.  Respiratory:  Negative for cough.   Cardiovascular:  Negative for chest pain and palpitations.  Gastrointestinal:  Negative for nausea and vomiting.     Objective:    BP 130/80   Pulse 74   Temp 98.2 F (36.8 C) (Oral)   Ht 6' (1.829 m)   Wt 179 lb 6.4 oz (81.4 kg)   SpO2 99%   BMI 24.33 kg/m  BP Readings from Last 3 Encounters:  04/23/22 130/80  03/26/22 130/78  01/22/22 140/78   Wt Readings from Last 3 Encounters:  04/23/22 179 lb 6.4 oz (81.4 kg)  03/26/22 182 lb 3.2 oz (82.6 kg)  01/22/22 187 lb 6.4 oz (85 kg)    Physical Exam Vitals reviewed.  Constitutional:      Appearance: She is well-developed.  Eyes:     Conjunctiva/sclera: Conjunctivae normal.  Cardiovascular:     Rate and Rhythm: Normal rate and regular rhythm.     Pulses: Normal pulses.     Heart sounds: Normal heart sounds.  Pulmonary:     Effort: Pulmonary effort is normal.     Breath sounds: Normal breath sounds. No wheezing, rhonchi or rales.  Skin:    General: Skin is warm and dry.  Neurological:     Mental Status: She is alert.  Psychiatric:        Speech: Speech normal.        Behavior: Behavior normal.        Thought Content: Thought content normal.       Assessment & Plan:   Problem List Items Addressed This Visit       Cardiovascular and Mediastinum   HTN (hypertension)    Chronic, stable.  Continue lisinopril 5 mg         Endocrine   DM (diabetes mellitus) ([place]) - Primary    Lab Results  Component Value Date   HGBA1C 6.7 (A) 04/23/2022  Continue Ozempic 1 mg, metformin 1000 mg twice  daily      Relevant Orders   POCT HgB A1C (Completed)     Other   HLD (hyperlipidemia)    Pending lipid panel. Continue lipitor 54m.        Initial Medicare annual wellness visit   Other Visit Diagnoses     Nausea       Relevant Medications   ondansetron (ZOFRAN ODT) 4 MG disintegrating tablet  I am having Kynlie Jane. Punt maintain her Multiple Vitamins-Minerals (CENTRUM SILVER ADULT 50+ PO), aspirin EC, onetouch ultrasoft, Pen Needles, Calcium-Vitamin D-Vitamin K (VIACTIV CALCIUM PLUS D PO), Accu-Chek Aviva Plus, Accu-Chek FastClix Lancets, Accu-Chek Guide Me, famotidine, pantoprazole, atorvastatin, Ozempic (1 MG/DOSE), citalopram, ferrous sulfate, lisinopril, metFORMIN, and ondansetron.   Meds ordered this encounter  Medications   ondansetron (ZOFRAN ODT) 4 MG disintegrating tablet    Sig: Take 1 tablet (4 mg total) by mouth every 8 (eight) hours as needed.    Dispense:  30 tablet    Refill:  1    Order Specific Question:   Supervising Provider    Answer:   Crecencio Mc [[id number]]    Return precautions given.   Risks, benefits, and alternatives of the medications and treatment plan prescribed today were discussed, and patient expressed understanding.   Education regarding symptom management and diagnosis given to patient on AVS.  Continue to follow with Burnard Hawthorne, FNP for routine health maintenance.   [name] and I agreed with plan.   Mable Paris, FNP

## 2022-04-27 ENCOUNTER — Other Ambulatory Visit: Payer: Self-pay | Admitting: Family

## 2022-04-27 DIAGNOSIS — D649 Anemia, unspecified: Secondary | ICD-10-CM

## 2022-04-28 ENCOUNTER — Telehealth: Payer: Self-pay

## 2022-04-28 ENCOUNTER — Telehealth: Payer: Self-pay | Admitting: Family

## 2022-04-28 NOTE — Addendum Note (Signed)
Addended by: Warden Fillers on: 04/28/2022 12:52 PM   Modules accepted: Orders

## 2022-04-28 NOTE — Telephone Encounter (Signed)
LMTCB to office to get results 

## 2022-04-28 NOTE — Telephone Encounter (Signed)
Patient returned office phone call. Patient stated she read her lab results on MyChart and will make lab appointment when she comes in next week for her b12.

## 2022-05-03 ENCOUNTER — Ambulatory Visit (INDEPENDENT_AMBULATORY_CARE_PROVIDER_SITE_OTHER): Payer: Medicare HMO

## 2022-05-03 ENCOUNTER — Encounter: Payer: Self-pay | Admitting: Family

## 2022-05-03 DIAGNOSIS — E538 Deficiency of other specified B group vitamins: Secondary | ICD-10-CM

## 2022-05-03 DIAGNOSIS — D649 Anemia, unspecified: Secondary | ICD-10-CM

## 2022-05-03 MED ORDER — CYANOCOBALAMIN 1000 MCG/ML IJ SOLN
1000.0000 ug | Freq: Once | INTRAMUSCULAR | Status: AC
  Administered 2022-05-03: 1000 ug via INTRAMUSCULAR

## 2022-05-03 NOTE — Progress Notes (Signed)
Patient presented for B 12 injection to left deltoid, patient voiced no concerns nor showed any signs of distress during injection.  Patient also had her non-fasting labs drawn along with her picking up stool cards.

## 2022-05-04 ENCOUNTER — Ambulatory Visit
Admission: RE | Admit: 2022-05-04 | Discharge: 2022-05-04 | Disposition: A | Discharge status: Home / Self Care | Payer: Medicare HMO | Source: Ambulatory Visit | Attending: Family | Admitting: Family

## 2022-05-04 ENCOUNTER — Encounter: Payer: Self-pay | Admitting: Family

## 2022-05-04 DIAGNOSIS — Z13820 Encounter for screening for osteoporosis: Secondary | ICD-10-CM | POA: Diagnosis present

## 2022-05-04 DIAGNOSIS — Z0000 Encounter for general adult medical examination without abnormal findings: Secondary | ICD-10-CM | POA: Insufficient documentation

## 2022-05-04 DIAGNOSIS — Z1231 Encounter for screening mammogram for malignant neoplasm of breast: Secondary | ICD-10-CM | POA: Diagnosis not present

## 2022-05-04 DIAGNOSIS — Z780 Asymptomatic menopausal state: Secondary | ICD-10-CM | POA: Diagnosis not present

## 2022-05-04 DIAGNOSIS — E119 Type 2 diabetes mellitus without complications: Secondary | ICD-10-CM | POA: Insufficient documentation

## 2022-05-04 LAB — IBC + FERRITIN
Ferritin: 13.7 ng/mL (ref 10.0–291.0)
Iron: 55 ug/dL (ref 42–145)
Saturation Ratios: 17.7 % — ABNORMAL LOW (ref 20.0–50.0)
TIBC: 310.8 ug/dL (ref 250.0–450.0)
Transferrin: 222 mg/dL (ref 212.0–360.0)

## 2022-05-04 LAB — B12 AND FOLATE PANEL
Folate: >24.2 ng/mL (ref >5.9)
Vitamin B-12: >1504 pg/mL — ABNORMAL HIGH (ref 211–911)

## 2022-05-06 ENCOUNTER — Telehealth: Payer: Self-pay | Admitting: Cardiology

## 2022-05-06 ENCOUNTER — Encounter: Payer: Self-pay | Admitting: Cardiology

## 2022-05-06 ENCOUNTER — Ambulatory Visit (INDEPENDENT_AMBULATORY_CARE_PROVIDER_SITE_OTHER): Payer: Medicare HMO | Admitting: Cardiology

## 2022-05-06 ENCOUNTER — Ambulatory Visit
Admission: RE | Admit: 2022-05-06 | Discharge: 2022-05-06 | Disposition: A | Discharge status: Home / Self Care | Payer: Medicare HMO | Source: Ambulatory Visit | Attending: Cardiology | Admitting: Cardiology

## 2022-05-06 VITALS — BP 142/76 | HR 72 | Ht 72.0 in | Wt 182.0 lb

## 2022-05-06 DIAGNOSIS — Z9189 Other specified personal risk factors, not elsewhere classified: Secondary | ICD-10-CM | POA: Diagnosis not present

## 2022-05-06 DIAGNOSIS — E7800 Pure hypercholesterolemia, unspecified: Secondary | ICD-10-CM | POA: Diagnosis not present

## 2022-05-06 DIAGNOSIS — I10 Essential (primary) hypertension: Secondary | ICD-10-CM

## 2022-05-06 NOTE — Telephone Encounter (Signed)
New Message:     She wants to make sure you received the results from his CT of the heart. Please call and let her know please.kj

## 2022-05-06 NOTE — Telephone Encounter (Signed)
[facility] Radiology and they wanted to alert [place] to the following over read:  IMPRESSION: 1. Focal confluent ground-glass densities in the posterior left lower lobe, concerning for pneumonia or aspiration. Given patient's age, follow-up noncontrast chest CT in 2-3 months is recommended to evaluate for resolution.  Will route to Dr. Azucena Cecil for review.

## 2022-05-06 NOTE — Patient Instructions (Signed)
Medication Instructions:   Your physician recommends that you continue on your current medications as directed. Please refer to the Current Medication list given to you today.  *If you need a refill on your cardiac medications before your next appointment, please call your pharmacy*   Lab Work: None ordered If you have labs (blood work) drawn today and your tests are completely normal, you will receive your results only by: MyChart Message (if you have MyChart) OR A paper copy in the mail If you have any lab test that is abnormal or we need to change your treatment, we will call you to review the results.   Testing/Procedures:  We will order CT coronary calcium score  $99 at our [facility] in [place]  Please call Judeth Cornfield at [phone] to schedule   [facility] [address] [place], [place] [address]    Follow-Up: At [facility], you and your health needs are our priority.  As part of our continuing mission to provide you with exceptional heart care, we have created designated Provider Care Teams.  These Care Teams include your primary Cardiologist (physician) and Advanced Practice Providers (APPs -  Physician Assistants and Nurse Practitioners) who all work together to provide you with the care you need, when you need it.  We recommend signing up for the patient portal called "MyChart".  Sign up information is provided on this After Visit Summary.  MyChart is used to connect with patients for Virtual Visits (Telemedicine).  Patients are able to view lab/test results, encounter notes, upcoming appointments, etc.  Non-urgent messages can be sent to your provider as well.   To learn more about what you can do with MyChart, go to [email or web address].    Your next appointment:   Follow up as needed   The format for your next appointment:   In Person  Provider:   You may see Debbe Odea, MD or one of the following  Advanced Practice Providers on your designated Care Team:   Nicolasa Ducking, NP Eula Listen, PA-C Cadence Fransico Michael, [place]    Other Instructions   Important Information About Sugar

## 2022-05-06 NOTE — Progress Notes (Signed)
Cardiology Office Note:    Date:  05/06/2022   ID:  Danielle Dickerson, DOB 01/25/1956, MRN [id number]  PCP:  Burnard Hawthorne, FNP   [facility] Providers Cardiologist:  Kate Sable, MD     Referring MD: Burnard Hawthorne, FNP   No chief complaint on file.   History of Present Illness:    Danielle Dickerson is a 66 y.o. female with a hx of hypertension, hyperlipidemia, diabetes who presents to establish care and also cardiac risk stratification.  She states being very active.  Denies chest pain or shortness of breath.  Exercises on her stationary bike every other day for about 40 minutes, also walks a couple of miles almost daily.  She feels well, recently establish care with her primary care provider who recommended she get cardiovascular risk stratification due to her mother having history of CHF at age 65.  Patient denies edema, denies smoking.    Past Medical History:  Diagnosis Date   COVID-19    05/27/21   Depression    Diabetes mellitus without complication ([place])    Type II   Eosinophilic esophagitis    Esophageal stricture 2019   GERD (gastroesophageal reflux disease)    Hyperlipidemia    Pernicious anemia     Past Surgical History:  Procedure Laterality Date   BREAST EXCISIONAL BIOPSY Right 2009   phyllodes removed    BREAST SURGERY     CESAREAN SECTION     CHOLECYSTECTOMY OPEN     COLONOSCOPY WITH PROPOFOL N/A 02/02/2021   Procedure: COLONOSCOPY WITH PROPOFOL;  Surgeon: Lin Landsman, MD;  Location: [place];  Service: Gastroenterology;  Laterality: N/A;   CYSTOSCOPY WITH STENT PLACEMENT Right 09/07/2021   Procedure: CYSTOSCOPY WITH STENT PLACEMENT; WITH RETROGRADE PYELOGRAM;  Surgeon: Hollice Espy, MD;  Location: [facility];  Service: Urology;  Laterality: Right;   CYSTOSCOPY/RETROGRADE/URETEROSCOPY/STONE EXTRACTION WITH BASKET  09/21/2021   Procedure: CYSTOSCOPY/RETROGRADE/URETEROSCOPY/STONE EXTRACTION WITH BASKET;  Surgeon: Hollice Espy, MD;  Location: [facility];  Service: Urology;;   ESOPHAGOGASTRODUODENOSCOPY (EGD) WITH PROPOFOL N/A 10/07/2021   Procedure: ESOPHAGOGASTRODUODENOSCOPY (EGD) WITH PROPOFOL;  Surgeon: Lin Landsman, MD;  Location: [facility];  Service: Gastroenterology;  Laterality: N/A;   SUPRACERVICAL ABDOMINAL HYSTERECTOMY  2006   has ovaries. Had heavy periods and was anemic. HAS cervix   TONSILLECTOMY  1966    Current Medications: Current Meds  Medication Sig   Accu-Chek FastClix Lancets MISC Used to check blood sugars twice a day.   aspirin EC 81 MG tablet Take 81 mg by mouth daily.   atorvastatin (LIPITOR) 40 MG tablet TAKE 1 TABLET BY MOUTH EVERY DAY   Blood Glucose Monitoring Suppl (ACCU-CHEK GUIDE ME) w/Device KIT USE AS DIRECTED   Calcium-Vitamin D-Vitamin K (VIACTIV CALCIUM PLUS D PO) Take 1 tablet by mouth daily.   citalopram (CELEXA) 20 MG tablet TAKE 1 TABLET BY MOUTH EVERY DAY   famotidine (PEPCID) 20 MG tablet Take 20 mg by mouth at bedtime.   ferrous sulfate 325 (65 FE) MG EC tablet Take 325 mg by mouth 3 (three) times daily with meals.   glucose blood (ACCU-CHEK AVIVA PLUS) test strip Used to check blood sugars twice a day.   Insulin Pen Needle (PEN NEEDLES) 32G X 4 MM MISC Used to give Victoza injections.   Lancets (ONETOUCH ULTRASOFT) lancets Used to check blood sugars once a day.   lisinopril (ZESTRIL) 5 MG tablet Take 1 tablet (5 mg total) by mouth daily.   metFORMIN (GLUCOPHAGE)  1000 MG tablet TAKE 1 TABLET (1,000 MG TOTAL) BY MOUTH 2 (TWO) TIMES DAILY WITH A MEAL.   Multiple Vitamins-Minerals (CENTRUM SILVER ADULT 50+ PO) Take 1 tablet by mouth daily.   ondansetron (ZOFRAN ODT) 4 MG disintegrating tablet Take 1 tablet (4 mg total) by mouth every 8 (eight) hours as needed.   pantoprazole (PROTONIX) 20 MG tablet TAKE 1 TABLET (20 MG TOTAL) BY MOUTH 2 (TWO) TIMES DAILY BEFORE A MEAL. (Patient taking differently: Take 20 mg by mouth daily.)   Semaglutide, 1 MG/DOSE,  (OZEMPIC, 1 MG/DOSE,) 4 MG/3ML SOPN Inject 1 mg into the skin once a week.     Allergies:   Pneumovax 23 [pneumococcal vac polyvalent] and Prevnar 13 [pneumococcal 13-val conj vacc]   Social History   Socioeconomic History   Marital status: Married    Spouse name: Not on file   Number of children: Not on file   Years of education: Not on file   Highest education level: Not on file  Occupational History   Not on file  Tobacco Use   Smoking status: Never   Smokeless tobacco: Never  Vaping Use   Vaping Use: Never used  Substance and Sexual Activity   Alcohol use: Not Currently   Drug use: Never   Sexual activity: Yes    Partners: Male  Other Topics Concern   Not on file  Social History Narrative   Moved from [place] 07/2019      Married   Husband retired   2 daughters that live in [place]      Homeschooling 85 yo grandson   Social Determinants of Health   Financial Resource Strain: Not on file  Food Insecurity: Not on file  Transportation Needs: Not on file  Physical Activity: Not on file  Stress: Not on file  Social Connections: Not on file     Family History: The patient's family history includes CVA (age of onset: 47) in her mother; Colon cancer in her father; Congestive Heart Failure in her mother; Esophageal cancer in her maternal uncle. There is no history of Thyroid cancer.  ROS:   Please see the history of present illness.     All other systems reviewed and are negative.  EKGs/Labs/Other Studies Reviewed:    The following studies were reviewed today:   EKG:  EKG is  ordered today.  The ekg ordered today demonstrates normal sinus rhythm, normal ECG  Recent Labs: 04/23/2022: ALT 13; BUN 19; Creatinine, Ser 0.83; Hemoglobin 11.1; Platelets 171.0; Potassium 4.1; Sodium 140; TSH 1.47  Recent Lipid Panel    Component Value Date/Time   CHOL 114 04/23/2022 0849   TRIG 58.0 04/23/2022 0849   HDL 51.50 04/23/2022 0849   CHOLHDL 2 04/23/2022 0849    VLDL 11.6 04/23/2022 0849   LDLCALC 51 04/23/2022 0849     Risk Assessment/Calculations:         Physical Exam:    VS:  BP (!) 142/76   Pulse 72   Ht 6' (1.829 m)   Wt 182 lb (82.6 kg)   SpO2 98%   BMI 24.68 kg/m     Wt Readings from Last 3 Encounters:  05/06/22 182 lb (82.6 kg)  04/23/22 179 lb 6.4 oz (81.4 kg)  03/26/22 182 lb 3.2 oz (82.6 kg)     GEN:  Well nourished, well developed in no acute distress HEENT: Normal NECK: No JVD; No carotid bruits CARDIAC: RRR, no murmurs, rubs, gallops RESPIRATORY:  Clear to auscultation without rales,  wheezing or rhonchi  ABDOMEN: Soft, non-tender, non-distended MUSCULOSKELETAL:  No edema; No deformity  SKIN: Warm and dry NEUROLOGIC:  Alert and oriented x 3 PSYCHIATRIC:  Normal affect   ASSESSMENT:    1. Primary hypertension   2. Pure hypercholesterolemia   3. Cardiovascular event risk    PLAN:    In order of problems listed above:  Hypertension, BP elevated today, usually controlled.  Continue lisinopril 5 mg daily. Hyperlipidemia, total and LDL cholesterol well controlled, LDL 51.  Continue Lipitor 40. Cardiac restratification, patient asymptomatic, will obtain coronary calcium scan.  Currently takes aspirin and Lipitor.  Follow-up as needed based on calcium score results.  Patient is otherwise asymptomatic, risk factors adequately controlled.      Medication Adjustments/Labs and Tests Ordered: Current medicines are reviewed at length with the patient today.  Concerns regarding medicines are outlined above.  Orders Placed This Encounter  Procedures   CT CARDIAC SCORING   EKG 12-Lead   No orders of the defined types were placed in this encounter.   Patient Instructions  Medication Instructions:   Your physician recommends that you continue on your current medications as directed. Please refer to the Current Medication list given to you today.  *If you need a refill on your cardiac medications before your  next appointment, please call your pharmacy*   Lab Work: None ordered If you have labs (blood work) drawn today and your tests are completely normal, you will receive your results only by: [place] (if you have MyChart) OR A paper copy in the mail If you have any lab test that is abnormal or we need to change your treatment, we will call you to review the results.   Testing/Procedures:  We will order CT coronary calcium score  $99 at our [facility] in [place]  Please call Colletta [place] at [phone] to schedule   [place], [place] [address]    Follow-Up: At [facility], you and your health needs are our priority.  As part of our continuing mission to provide you with exceptional heart care, we have created designated Provider Care Teams.  These Care Teams include your primary Cardiologist (physician) and Advanced Practice Providers (APPs -  Physician Assistants and Nurse Practitioners) who all work together to provide you with the care you need, when you need it.  We recommend signing up for the patient portal called "MyChart".  Sign up information is provided on this After Visit Summary.  MyChart is used to connect with patients for Virtual Visits (Telemedicine).  Patients are able to view lab/test results, encounter notes, upcoming appointments, etc.  Non-urgent messages can be sent to your provider as well.   To learn more about what you can do with MyChart, go to [email or web address].    Your next appointment:   Follow up as needed   The format for your next appointment:   In Person  Provider:   You may see Kate Sable, MD or one of the following Advanced Practice Providers on your designated Care Team:   Murray Hodgkins, NP Christell Faith, PA-C Cadence Kathlen Mody, [place]    Other Instructions   Important Information About Sugar         Signed, Kate Sable, MD  05/06/2022  9:07 AM    [place]

## 2022-05-07 ENCOUNTER — Inpatient Hospital Stay: Admission: RE | Admit: 2022-05-07 | Payer: Medicare HMO | Source: Ambulatory Visit

## 2022-05-07 ENCOUNTER — Telehealth: Payer: Medicare HMO | Admitting: Physician Assistant

## 2022-05-07 DIAGNOSIS — J189 Pneumonia, unspecified organism: Secondary | ICD-10-CM | POA: Diagnosis not present

## 2022-05-07 DIAGNOSIS — L0100 Impetigo, unspecified: Secondary | ICD-10-CM

## 2022-05-07 MED ORDER — AMOXICILLIN-POT CLAVULANATE 875-125 MG PO TABS: 1.0000 | ORAL_TABLET | Freq: Two times a day (BID) | ORAL | 0 refills | Status: DC

## 2022-05-07 NOTE — Telephone Encounter (Signed)
Called patient and informed her of Dr. Merita Norton recommendation as noted below:  Have patient follow up with primary care provider for abnormality. Likely no clinical significance but its good to have follow up with pcp   Patient stated that she woke up not feeling well in her chest and already has a virtual appointment with her PCP. She stated that she will let her know of these findings. She was appreciative for the follow up.

## 2022-05-07 NOTE — Progress Notes (Signed)
Virtual Visit Consent   Danielle Dickerson, you are scheduled for a virtual visit with a [name] provider today. Just as with appointments in the office, your consent must be obtained to participate. Your consent will be active for this visit and any virtual visit you may have with one of our providers in the next 365 days. If you have a MyChart account, a copy of this consent can be sent to you electronically.  As this is a virtual visit, video technology does not allow for your provider to perform a traditional examination. This may limit your provider's ability to fully assess your condition. If your provider identifies any concerns that need to be evaluated in person or the need to arrange testing (such as labs, EKG, etc.), we will make arrangements to do so. Although advances in technology are sophisticated, we cannot ensure that it will always work on either your end or our end. If the connection with a video visit is poor, the visit may have to be switched to a telephone visit. With either a video or telephone visit, we are not always able to ensure that we have a secure connection.  By engaging in this virtual visit, you consent to the provision of healthcare and authorize for your insurance to be billed (if applicable) for the services provided during this visit. Depending on your insurance coverage, you may receive a charge related to this service.  I need to obtain your verbal consent now. Are you willing to proceed with your visit today? [name] has provided verbal consent on 05/07/2022 for a virtual visit (video or telephone). Mar Daring, PA-C  Date: 05/07/2022 10:22 AM  Virtual Visit via Video Note   I, Mar Daring, connected with  Danielle Dickerson  ([id number], 10/01/1956) on 05/07/22 at 10:15 AM EDT by a video-enabled telemedicine application and verified that I am speaking with the correct person using two identifiers.  Location: Patient: Virtual Visit Location  Patient: Home Provider: Virtual Visit Location Provider: Office/Clinic   I discussed the limitations of evaluation and management by telemedicine and the availability of in person appointments. The patient expressed understanding and agreed to proceed.    History of Present Illness: Danielle Dickerson is a 66 y.o. who identifies as a female who was assigned female at birth, and is being seen today for URI symptoms.  HPI: URI  This is a new problem. The current episode started yesterday. The problem has been gradually worsening. Associated symptoms include congestion, coughing, headaches, nausea, rhinorrhea and a sore throat. Pertinent negatives include no sinus pain. Associated symptoms comments: myalgias. She has tried acetaminophen for the symptoms. The treatment provided no relief.   At home covid testing is negative  Did have a CT Cardiac scoring yesterday that was positive for LLL pneumonia. See image result below:  ADDENDUM REPORT: 05/06/2022 16:36   EXAM: OVER-READ INTERPRETATION  CT CHEST   The following report is an over-read performed by radiologist Dr. Nettie Elm [facility] on 05/06/2022. This over-read does not include interpretation of cardiac or coronary anatomy or pathology. The coronary calcium score interpretation by the cardiologist is attached.   COMPARISON:  None.   FINDINGS: Vascular: There are no significant vascular findings.   Mediastinum/Nodes: There are no enlarged lymph nodes.The visualized esophagus demonstrates no significant findings.   Lungs/Pleura: Focal confluent ground-glass densities in the posterior left lower lobe. No pneumothorax or pleural effusion.   Upper abdomen: No acute abnormality.  Musculoskeletal/Chest wall: No chest wall abnormality. No acute or significant osseous findings.   IMPRESSION: 1. Focal confluent ground-glass densities in the posterior left lower lobe, concerning for pneumonia or aspiration. Given  patient's age, follow-up noncontrast chest CT in 2-3 months is recommended to evaluate for resolution.   These results will be called to the ordering clinician or representative by the Radiologist Assistant, and communication documented in the PACS or [employer].     Electronically Signed   By: [name] M.D.   On: 05/06/2022 16:36    Addended by [name], MD on 05/06/2022  4:38 PM   Also, was exposed to Impetigo from grandson and feels irritation starting on her scalp (they slept on the same pillow in bed beside each other).  Problems:  Patient Active Problem List   Diagnosis Date Noted   Medicare annual wellness visit, initial 04/04/2022   HTN (hypertension) 01/22/2022   Fatigue 09/30/2021   Right ureteral stone 09/07/2021   Bilious emesis 08/18/2021   Renal stone 08/18/2021   Pernicious anemia 07/03/2021   Initial Medicare annual wellness visit    Atypical squamous cell changes of undetermined significance (ASCUS) on cervical cytology with negative high risk human papilloma virus (HPV) test result 05/08/2020   DM (diabetes mellitus) ([place]) 01/30/2020   HLD (hyperlipidemia) 01/30/2020   GERD (gastroesophageal reflux disease) 01/30/2020    Allergies:  Allergies  Allergen Reactions   Pneumovax 23 [Pneumococcal Vac Polyvalent] Swelling    Causes arm swelling, redness.   Prevnar 13 [Pneumococcal 13-Val Conj Vacc] Swelling   Medications:  Current Outpatient Medications:    amoxicillin-clavulanate (AUGMENTIN) 875-125 MG tablet, Take 1 tablet by mouth 2 (two) times daily., Disp: 20 tablet, Rfl: 0   Accu-Chek FastClix Lancets MISC, Used to check blood sugars twice a day., Disp: 100 each, Rfl: 3   aspirin EC 81 MG tablet, Take 81 mg by mouth daily., Disp: , Rfl:    atorvastatin (LIPITOR) 40 MG tablet, TAKE 1 TABLET BY MOUTH EVERY DAY, Disp: 90 tablet, Rfl: 3   Blood Glucose Monitoring Suppl (ACCU-CHEK GUIDE ME) w/Device KIT, USE AS DIRECTED, Disp: 1 kit, Rfl:  0   Calcium-Vitamin D-Vitamin K (VIACTIV CALCIUM PLUS D PO), Take 1 tablet by mouth daily., Disp: , Rfl:    citalopram (CELEXA) 20 MG tablet, TAKE 1 TABLET BY MOUTH EVERY DAY, Disp: 90 tablet, Rfl: 2   famotidine (PEPCID) 20 MG tablet, Take 20 mg by mouth at bedtime., Disp: , Rfl:    ferrous sulfate 325 (65 FE) MG EC tablet, Take 325 mg by mouth 3 (three) times daily with meals., Disp: , Rfl:    glucose blood (ACCU-CHEK AVIVA PLUS) test strip, Used to check blood sugars twice a day., Disp: 100 each, Rfl: 5   Insulin Pen Needle (PEN NEEDLES) 32G X 4 MM MISC, Used to give Victoza injections., Disp: 100 each, Rfl: 1   Lancets (ONETOUCH ULTRASOFT) lancets, Used to check blood sugars once a day., Disp: 100 each, Rfl: 4   lisinopril (ZESTRIL) 5 MG tablet, Take 1 tablet (5 mg total) by mouth daily., Disp: 90 tablet, Rfl: 1   metFORMIN (GLUCOPHAGE) 1000 MG tablet, TAKE 1 TABLET (1,000 MG TOTAL) BY MOUTH 2 (TWO) TIMES DAILY WITH A MEAL., Disp: 180 tablet, Rfl: 1   Multiple Vitamins-Minerals (CENTRUM SILVER ADULT 50+ PO), Take 1 tablet by mouth daily., Disp: , Rfl:    ondansetron (ZOFRAN ODT) 4 MG disintegrating tablet, Take 1 tablet (4 mg total) by mouth  every 8 (eight) hours as needed., Disp: 30 tablet, Rfl: 1   pantoprazole (PROTONIX) 20 MG tablet, TAKE 1 TABLET (20 MG TOTAL) BY MOUTH 2 (TWO) TIMES DAILY BEFORE A MEAL. (Patient taking differently: Take 20 mg by mouth daily.), Disp: 60 tablet, Rfl: 3   Semaglutide, 1 MG/DOSE, (OZEMPIC, 1 MG/DOSE,) 4 MG/3ML SOPN, Inject 1 mg into the skin once a week., Disp: 9 mL, Rfl: 1  Observations/Objective: Patient is well-developed, well-nourished in no acute distress.  Resting comfortably at home.  Head is normocephalic, atraumatic.  No labored breathing.  Speech is clear and coherent with logical content.  Patient is alert and oriented at baseline.    Assessment and Plan: 1. Pneumonia of left lower lobe due to infectious organism - amoxicillin-clavulanate  (AUGMENTIN) 875-125 MG tablet; Take 1 tablet by mouth 2 (two) times daily.  Dispense: 20 tablet; Refill: 0  2. Impetigo  - Augmentin given to cover both LLL pneumonia and possible Impetigo - Push fluids - Rest - Strict ER precautions discussed - Advised to schedule f/u with PCP in next 2 weeks for follow up on pneumonia  Follow Up Instructions: I discussed the assessment and treatment plan with the patient. The patient was provided an opportunity to ask questions and all were answered. The patient agreed with the plan and demonstrated an understanding of the instructions.  A copy of instructions were sent to the patient via MyChart unless otherwise noted below.   Patient has requested to receive PHI (AVS, Work Notes, etc) pertaining to this video visit through e-mail as they are currently without active [place]. They have voiced understand that email is not considered secure and their health information could be viewed by someone other than the patient.   The patient was advised to call back or seek an in-person evaluation if the symptoms worsen or if the condition fails to improve as anticipated.  Time:  I spent 12 minutes with the patient via telehealth technology discussing the above problems/concerns.    Mar Daring, PA-C

## 2022-05-07 NOTE — Patient Instructions (Signed)
Danielle Dickerson, thank you for joining Mar Daring, PA-C for today's virtual visit.  While this provider is not your primary care provider (PCP), if your PCP is located in our provider database this encounter information will be shared with them immediately following your visit.  Consent: (Patient) Danielle Dickerson provided verbal consent for this virtual visit at the beginning of the encounter.  Current Medications:  Current Outpatient Medications:    amoxicillin-clavulanate (AUGMENTIN) 875-125 MG tablet, Take 1 tablet by mouth 2 (two) times daily., Disp: 20 tablet, Rfl: 0   Accu-Chek FastClix Lancets MISC, Used to check blood sugars twice a day., Disp: 100 each, Rfl: 3   aspirin EC 81 MG tablet, Take 81 mg by mouth daily., Disp: , Rfl:    atorvastatin (LIPITOR) 40 MG tablet, TAKE 1 TABLET BY MOUTH EVERY DAY, Disp: 90 tablet, Rfl: 3   Blood Glucose Monitoring Suppl (ACCU-CHEK GUIDE ME) w/Device KIT, USE AS DIRECTED, Disp: 1 kit, Rfl: 0   Calcium-Vitamin D-Vitamin K (VIACTIV CALCIUM PLUS D PO), Take 1 tablet by mouth daily., Disp: , Rfl:    citalopram (CELEXA) 20 MG tablet, TAKE 1 TABLET BY MOUTH EVERY DAY, Disp: 90 tablet, Rfl: 2   famotidine (PEPCID) 20 MG tablet, Take 20 mg by mouth at bedtime., Disp: , Rfl:    ferrous sulfate 325 (65 FE) MG EC tablet, Take 325 mg by mouth 3 (three) times daily with meals., Disp: , Rfl:    glucose blood (ACCU-CHEK AVIVA PLUS) test strip, Used to check blood sugars twice a day., Disp: 100 each, Rfl: 5   Insulin Pen Needle (PEN NEEDLES) 32G X 4 MM MISC, Used to give Victoza injections., Disp: 100 each, Rfl: 1   Lancets (ONETOUCH ULTRASOFT) lancets, Used to check blood sugars once a day., Disp: 100 each, Rfl: 4   lisinopril (ZESTRIL) 5 MG tablet, Take 1 tablet (5 mg total) by mouth daily., Disp: 90 tablet, Rfl: 1   metFORMIN (GLUCOPHAGE) 1000 MG tablet, TAKE 1 TABLET (1,000 MG TOTAL) BY MOUTH 2 (TWO) TIMES DAILY WITH A MEAL., Disp: 180 tablet, Rfl: 1    Multiple Vitamins-Minerals (CENTRUM SILVER ADULT 50+ PO), Take 1 tablet by mouth daily., Disp: , Rfl:    ondansetron (ZOFRAN ODT) 4 MG disintegrating tablet, Take 1 tablet (4 mg total) by mouth every 8 (eight) hours as needed., Disp: 30 tablet, Rfl: 1   pantoprazole (PROTONIX) 20 MG tablet, TAKE 1 TABLET (20 MG TOTAL) BY MOUTH 2 (TWO) TIMES DAILY BEFORE A MEAL. (Patient taking differently: Take 20 mg by mouth daily.), Disp: 60 tablet, Rfl: 3   Semaglutide, 1 MG/DOSE, (OZEMPIC, 1 MG/DOSE,) 4 MG/3ML SOPN, Inject 1 mg into the skin once a week., Disp: 9 mL, Rfl: 1   Medications ordered in this encounter:  Meds ordered this encounter  Medications   amoxicillin-clavulanate (AUGMENTIN) 875-125 MG tablet    Sig: Take 1 tablet by mouth 2 (two) times daily.    Dispense:  20 tablet    Refill:  0    Order Specific Question:   Supervising Provider    Answer:   Sabra Heck, BRIAN [[id number]]     *If you need refills on other medications prior to your next appointment, please contact your pharmacy*  Follow-Up: Call back or seek an in-person evaluation if the symptoms worsen or if the condition fails to improve as anticipated.  Other Instructions Community-Acquired Pneumonia, Adult Pneumonia is an infection of the lungs. It causes irritation and swelling in the airways  of the lungs. Mucus and fluid may also build up inside the airways. This may cause coughing and trouble breathing. One type of pneumonia can happen while you are in a hospital. A different type can happen when you are not in a hospital (community-acquired pneumonia). What are the causes?  This condition is caused by germs (viruses, bacteria, or fungi). Some types of germs can spread from person to person. Pneumonia is not thought to spread from person to person. What increases the risk? You are more likely to develop this condition if: You have a long-term (chronic) disease, such as: Disease of the lungs. This may be chronic obstructive  pulmonary disease (COPD) or asthma. Heart failure. Cystic fibrosis. Diabetes. Kidney disease. Sickle cell disease. HIV. You have other health problems, such as: Your body's defense system (immune system) is weak. A condition that may cause you to breathe in fluids from your mouth and nose. You had your spleen taken out. You do not take good care of your teeth and mouth (poor dental hygiene). You use or have used tobacco products. You travel where the germs that cause this illness are common. You are near certain animals or the places they live. You are older than 66 years of age. What are the signs or symptoms? Symptoms of this condition include: A cough. A fever. Sweating or chills. Chest pain, often when you breathe deeply or cough. Breathing problems, such as: Fast breathing. Trouble breathing. Shortness of breath. Feeling tired (fatigued). Muscle aches. How is this treated? Treatment for this condition depends on many things, such as: The cause of your illness. Your medicines. Your other health problems. Most adults can be treated at home. Sometimes, treatment must happen in a hospital. Treatment may include medicines to kill germs. Medicines may depend on which germ caused your illness. Very bad pneumonia is rare. If you get it, you may: Have a machine to help you breathe. Have fluid taken away from around your lungs. Follow these instructions at home: Medicines Take over-the-counter and prescription medicines only as told by your doctor. Take cough medicine only if you are losing sleep. Cough medicine can keep your body from taking mucus away from your lungs. If you were prescribed an antibiotic medicine, take it as told by your doctor. Do not stop taking the antibiotic even if you start to feel better. Lifestyle     Do not drink alcohol. Do not use any products that contain nicotine or tobacco, such as cigarettes, e-cigarettes, and chewing tobacco. If you need  help quitting, ask your doctor. Eat a healthy diet. This includes a lot of vegetables, fruits, whole grains, low-fat dairy products, and low-fat (lean) protein. General instructions  Rest a lot. Sleep for at least 8 hours each night. Sleep with your head and neck raised. Put a few pillows under your head or sleep in a reclining chair. Return to your normal activities as told by your doctor. Ask your doctor what activities are safe for you. Drink enough fluid to keep your pee (urine) pale yellow. If your throat is sore, rinse your mouth often with salt water. To make salt water, dissolve -1 tsp (3-6 g) of salt in 1 cup (237 mL) of warm water. Keep all follow-up visits as told by your doctor. This is important. How is this prevented? You can lower your risk of pneumonia by: Getting the pneumonia shot (vaccine). These shots have different types and schedules. Ask your doctor what works best for you. Think about getting  this shot if: You are older than 66 years of age. You are 77-65 years of age and: You are being treated for cancer. You have long-term lung disease. You have other problems that affect your body's defense system. Ask your doctor if you have one of these. Getting your flu shot every year. Ask your doctor which type of shot is best for you. Going to the dentist as often as told. Washing your hands often with soap and water for at least 20 seconds. If you cannot use soap and water, use hand sanitizer. Contact a doctor if: You have a fever. You lose sleep because your cough medicine does not help. Get help right away if: You are short of breath and this gets worse. You have more chest pain. Your sickness gets worse. This is very serious if: You are an older adult. Your body's defense system is weak. You cough up blood. These symptoms may be an emergency. Do not wait to see if the symptoms will go away. Get medical help right away. Call your local emergency services (911 in the  [place] Do not drive yourself to the hospital. Summary Pneumonia is an infection of the lungs. Community-acquired pneumonia affects people who have not been in the hospital. Certain germs can cause this infection. This condition may be treated with medicines that kill germs. For very bad pneumonia, you may need a hospital stay and treatment to help with breathing. This information is not intended to replace advice given to you by your health care provider. Make sure you discuss any questions you have with your health care provider. Document Revised: 08/28/2019 Document Reviewed: 08/28/2019 Elsevier Patient Education  [employer].    If you have been instructed to have an in-person evaluation today at a local Urgent Care facility, please use the link below. It will take you to a list of all of our available [facility], including address, phone number and hours of operation. Please do not delay care.  [facility]  If you or a family member do not have a primary care provider, use the link below to schedule a visit and establish care. When you choose a [facility] primary care physician or advanced practice provider, you gain a long-term partner in health. Find a Primary Care Provider  Learn more about [facility] in-office and virtual care options: [facility]

## 2022-05-10 ENCOUNTER — Encounter: Payer: Self-pay | Admitting: Internal Medicine

## 2022-05-10 ENCOUNTER — Telehealth (INDEPENDENT_AMBULATORY_CARE_PROVIDER_SITE_OTHER): Payer: Medicare HMO | Admitting: Internal Medicine

## 2022-05-10 DIAGNOSIS — J189 Pneumonia, unspecified organism: Secondary | ICD-10-CM | POA: Diagnosis not present

## 2022-05-10 MED ORDER — AZITHROMYCIN 500 MG PO TABS: 500.0000 mg | ORAL_TABLET | Freq: Every day | ORAL | 0 refills | Status: DC

## 2022-05-10 MED ORDER — DOXYCYCLINE HYCLATE 100 MG PO TABS: 100.0000 mg | ORAL_TABLET | Freq: Two times a day (BID) | ORAL | 0 refills | Status: DC

## 2022-05-10 MED ORDER — PREDNISONE 10 MG PO TABS: ORAL_TABLET | ORAL | 0 refills | Status: DC

## 2022-05-10 MED ORDER — HYDROCOD POLI-CHLORPHE POLI ER 10-8 MG/5ML PO SUER: 5.0000 mL | Freq: Two times a day (BID) | ORAL | 0 refills | Status: DC | PRN

## 2022-05-10 MED ORDER — HYDROCOD POLI-CHLORPHE POLI ER 10-8 MG/5ML PO SUER
5.0000 mL | Freq: Two times a day (BID) | ORAL | 0 refills | Status: DC | PRN
Start: 2022-05-10 — End: 2022-05-28

## 2022-05-10 NOTE — Assessment & Plan Note (Addendum)
TREATED by PA on June 9 for LLL PNA seen on coronary CT done June 8  .  Treated with augmentin only x 10 days.  Azithromycin and doxycycline , prednisone taper , cough suppressant.  Needs repeat chest x ray in 8 weeks .  Daily use of a probiotic advised for 3 weeks.

## 2022-05-10 NOTE — Patient Instructions (Addendum)
Take your last dose of augmentin tonight and your first dose of doxycycline  WITH FOOD   Tomorrow  :  CONTINUE DOXYCYCLINE TWICE DAILY WITH FOOD , START AND AZITHROMYCIN ONCE  DAILY  WITH THE MORNING DOSE OF DOXYCYCLINE   START THE PREDNISONE TAPER TOMORROW MORNING:   6 tablets all at once on Day 1,  Then taper by 1 tablet daily until gone   Onycha HYDROCODONE TO SUPPRESS COUGH .  MAY USE EVERY 12 HOURS.  IF IT IS TOO STRONG FOR DAYTIME USE,  USE DELSYM INSTEAD    Please take a probiotic ( Align, Floraque or Culturelle) , OR the generic version of one of these  For a minimum of 3 weeks to prevent a serious antibiotic associated diarrhea  Called clostridium dificile colitis    YOU WILL NEED A REPEAT IMAGING STUDY (CHEST  X RAY ) IN 8 WEEKS TO ENSURE RESOLUTION

## 2022-05-10 NOTE — Progress Notes (Signed)
Virtual Visit via [facility] Note  This visit type was conducted due to national recommendations for restrictions regarding the COVID-19 pandemic (e.g. social distancing).  This format is felt to be most appropriate for this patient at this time.  All issues noted in this document were discussed and addressed.  No physical exam was performed (except for noted visual exam findings with Video Visits).   I connected withNAME@ on 05/10/22 at  4:30 PM EDT by a video enabled telemedicine application  and verified that I am speaking with the correct person using two identifiers. Location patient: home Location provider: work or home office Persons participating in the virtual visit: patient, provider  I discussed the limitations, risks, security and privacy concerns of performing an evaluation and management service by telephone and the availability of in person appointments. I also discussed with the patient that there may be a patient responsible charge related to this service. The patient expressed understanding and agreed to proceed.  Reason for visit: persistent cough, malaise , pneumonia   HPI:  66 yr old female diagnosed with LLL pneumonia found on June 8 CT chest ,  treated with augmentin on June 9 .  Has failed to improve,  reports persistent cough, malaise chest pain /back pain .  Occasional wheezing without shortness of breath and orthopnea.    ROS: See pertinent positives and negatives per HPI.  Past Medical History:  Diagnosis Date   COVID-19    05/27/21   Depression    Diabetes mellitus without complication ([place])    Type II   Eosinophilic esophagitis    Esophageal stricture 2019   GERD (gastroesophageal reflux disease)    Hyperlipidemia    Pernicious anemia     Past Surgical History:  Procedure Laterality Date   BREAST EXCISIONAL BIOPSY Right 2009   phyllodes removed    BREAST SURGERY     CESAREAN SECTION     CHOLECYSTECTOMY OPEN     COLONOSCOPY WITH PROPOFOL N/A  02/02/2021   Procedure: COLONOSCOPY WITH PROPOFOL;  Surgeon: Lin Landsman, MD;  Location: [place];  Service: Gastroenterology;  Laterality: N/A;   CYSTOSCOPY WITH STENT PLACEMENT Right 09/07/2021   Procedure: CYSTOSCOPY WITH STENT PLACEMENT; WITH RETROGRADE PYELOGRAM;  Surgeon: Hollice Espy, MD;  Location: [facility];  Service: Urology;  Laterality: Right;   CYSTOSCOPY/RETROGRADE/URETEROSCOPY/STONE EXTRACTION WITH BASKET  09/21/2021   Procedure: CYSTOSCOPY/RETROGRADE/URETEROSCOPY/STONE EXTRACTION WITH BASKET;  Surgeon: Hollice Espy, MD;  Location: [facility];  Service: Urology;;   ESOPHAGOGASTRODUODENOSCOPY (EGD) WITH PROPOFOL N/A 10/07/2021   Procedure: ESOPHAGOGASTRODUODENOSCOPY (EGD) WITH PROPOFOL;  Surgeon: Lin Landsman, MD;  Location: [facility];  Service: Gastroenterology;  Laterality: N/A;   SUPRACERVICAL ABDOMINAL HYSTERECTOMY  2006   has ovaries. Had heavy periods and was anemic. HAS cervix   TONSILLECTOMY  1966    Family History  Problem Relation Age of Onset   Congestive Heart Failure Mother    CVA Mother 52   Colon cancer Father    Esophageal cancer Maternal Uncle    Thyroid cancer Neg Hx     SOCIAL HX:  reports that she has never smoked. She has never used smokeless tobacco. She reports that she does not currently use alcohol. She reports that she does not use drugs.    Current Outpatient Medications:    Accu-Chek FastClix Lancets MISC, Used to check blood sugars twice a day., Disp: 100 each, Rfl: 3   aspirin EC 81 MG tablet, Take 81 mg by mouth daily., Disp: , Rfl:  atorvastatin (LIPITOR) 40 MG tablet, TAKE 1 TABLET BY MOUTH EVERY DAY, Disp: 90 tablet, Rfl: 3   azithromycin (ZITHROMAX) 500 MG tablet, Take 1 tablet (500 mg total) by mouth daily., Disp: 7 tablet, Rfl: 0   Blood Glucose Monitoring Suppl (ACCU-CHEK GUIDE ME) w/Device KIT, USE AS DIRECTED, Disp: 1 kit, Rfl: 0   Calcium-Vitamin D-Vitamin K (VIACTIV CALCIUM PLUS D PO), Take 1 tablet  by mouth daily., Disp: , Rfl:    Cholecalciferol (VITAMIN D3 PO), Take 400 mg by mouth daily., Disp: , Rfl:    citalopram (CELEXA) 20 MG tablet, TAKE 1 TABLET BY MOUTH EVERY DAY, Disp: 90 tablet, Rfl: 2   doxycycline (VIBRA-TABS) 100 MG tablet, Take 1 tablet (100 mg total) by mouth 2 (two) times daily. WITH A FULL MEAL, Disp: 14 tablet, Rfl: 0   famotidine (PEPCID) 20 MG tablet, Take 20 mg by mouth at bedtime., Disp: , Rfl:    ferrous sulfate 325 (65 FE) MG EC tablet, Take 325 mg by mouth 3 (three) times daily with meals., Disp: , Rfl:    glucose blood (ACCU-CHEK AVIVA PLUS) test strip, Used to check blood sugars twice a day., Disp: 100 each, Rfl: 5   Insulin Pen Needle (PEN NEEDLES) 32G X 4 MM MISC, Used to give Victoza injections., Disp: 100 each, Rfl: 1   Lancets (ONETOUCH ULTRASOFT) lancets, Used to check blood sugars once a day., Disp: 100 each, Rfl: 4   lisinopril (ZESTRIL) 5 MG tablet, Take 1 tablet (5 mg total) by mouth daily., Disp: 90 tablet, Rfl: 1   metFORMIN (GLUCOPHAGE) 1000 MG tablet, TAKE 1 TABLET (1,000 MG TOTAL) BY MOUTH 2 (TWO) TIMES DAILY WITH A MEAL., Disp: 180 tablet, Rfl: 1   Multiple Vitamins-Minerals (CENTRUM SILVER ADULT 50+ PO), Take 1 tablet by mouth daily., Disp: , Rfl:    ondansetron (ZOFRAN ODT) 4 MG disintegrating tablet, Take 1 tablet (4 mg total) by mouth every 8 (eight) hours as needed., Disp: 30 tablet, Rfl: 1   pantoprazole (PROTONIX) 20 MG tablet, TAKE 1 TABLET (20 MG TOTAL) BY MOUTH 2 (TWO) TIMES DAILY BEFORE A MEAL. (Patient taking differently: Take 20 mg by mouth daily.), Disp: 60 tablet, Rfl: 3   predniSONE (DELTASONE) 10 MG tablet, 6 tablets all at once on Day 1 , then reduce by 1 tablet daily until gone, Disp: 21 tablet, Rfl: 0   Semaglutide, 1 MG/DOSE, (OZEMPIC, 1 MG/DOSE,) 4 MG/3ML SOPN, Inject 1 mg into the skin once a week., Disp: 9 mL, Rfl: 1   chlorpheniramine-HYDROcodone (TUSSIONEX PENNKINETIC ER) 10-8 MG/5ML, Take 5 mLs by mouth every 12 (twelve)  hours as needed., Disp: 180 mL, Rfl: 0  EXAM:  VITALS per patient if applicable:  GENERAL: alert, oriented, appears well and in no acute distress  HEENT: atraumatic, conjunttiva clear, no obvious abnormalities on inspection of external nose and ears  NECK: normal movements of the head and neck  LUNGS: on inspection no signs of respiratory distress, breathing rate appears normal, no obvious gross SOB, gasping or wheezing  CV: no obvious cyanosis  MS: moves all visible extremities without noticeable abnormality  PSYCH/NEURO: pleasant and cooperative, no obvious depression or anxiety, speech and thought processing grossly intact  ASSESSMENT AND PLAN:  Discussed the following assessment and plan:  Pneumonia of left lower lobe due to infectious organism  LLL pneumonia TREATED by PA on June 9 for LLL PNA seen on coronary CT done June 8  .  Treated with augmentin only x 10 days.  Azithromycin and doxycycline , prednisone taper , cough suppressant.  Needs repeat chest x ray in 8 weeks .  Daily use of a probiotic advised for 3 weeks.     I discussed the assessment and treatment plan with the patient. The patient was provided an opportunity to ask questions and all were answered. The patient agreed with the plan and demonstrated an understanding of the instructions.   The patient was advised to call back or seek an in-person evaluation if the symptoms worsen or if the condition fails to improve as anticipated.   I spent 20 minutes dedicated to the care of this patient on the date of this encounter to include pre-visit review of her medical history,  Face-to-face time with the patient , recent CT chest , and post visit ordering of testing and therapeutics.    Crecencio Mc, MD

## 2022-05-19 LAB — HM DIABETES EYE EXAM

## 2022-05-25 LAB — FECAL OCCULT BLOOD, IMMUNOCHEMICAL: Fecal Occult Bld: NEGATIVE

## 2022-05-28 ENCOUNTER — Encounter: Payer: Self-pay | Admitting: Family

## 2022-05-28 ENCOUNTER — Ambulatory Visit (INDEPENDENT_AMBULATORY_CARE_PROVIDER_SITE_OTHER): Payer: Medicare HMO | Admitting: Family

## 2022-05-28 VITALS — BP 138/82 | HR 73 | Temp 97.9°F | Ht 72.0 in | Wt 177.8 lb

## 2022-05-28 DIAGNOSIS — J189 Pneumonia, unspecified organism: Secondary | ICD-10-CM

## 2022-05-28 DIAGNOSIS — E119 Type 2 diabetes mellitus without complications: Secondary | ICD-10-CM

## 2022-05-28 DIAGNOSIS — D649 Anemia, unspecified: Secondary | ICD-10-CM

## 2022-05-28 NOTE — Progress Notes (Signed)
Had bad case of   Pneumonia just wants to make sure she is all clear

## 2022-05-28 NOTE — Assessment & Plan Note (Signed)
Clinical resolution. I have ordered CT chest without contrast due to to be scheduled in 2 to 3 months.  Patient will let me know how she is doing.  We did discuss if symptoms were to recur, we could consider Symbicort inhaler or prednisone for any residual inflammation

## 2022-05-28 NOTE — Patient Instructions (Signed)
I am glad you are feeling better.  Certainly if you think symptoms were to recur please let me know right away

## 2022-05-28 NOTE — Progress Notes (Signed)
Subjective:    Patient ID: Danielle Dickerson, female    DOB: 1956-05-22, 66 y.o.   MRN: [id number]  CC: Danielle Dickerson is a 66 y.o. female who presents today for follow up.   HPI: Feels well today.  Cough and congestion have resolved.  No fever.   Follow-up pneumonia, diagnosed left lower lobe pneumonia found June 8 on CT chest.  She was treated with Augmentin x 10 days on June 9.  She has since completed azithromycin, doxycycline and prednisone taper.   Seen virtually Dr Derrel Nip 05/10/2022 HISTORY:  Past Medical History:  Diagnosis Date   COVID-19    05/27/21   Depression    Diabetes mellitus without complication ([place])    Type II   Eosinophilic esophagitis    Esophageal stricture 2019   GERD (gastroesophageal reflux disease)    Hyperlipidemia    Pernicious anemia    Past Surgical History:  Procedure Laterality Date   BREAST EXCISIONAL BIOPSY Right 2009   phyllodes removed    BREAST SURGERY     CESAREAN SECTION     CHOLECYSTECTOMY OPEN     COLONOSCOPY WITH PROPOFOL N/A 02/02/2021   Procedure: COLONOSCOPY WITH PROPOFOL;  Surgeon: Lin Landsman, MD;  Location: [place];  Service: Gastroenterology;  Laterality: N/A;   CYSTOSCOPY WITH STENT PLACEMENT Right 09/07/2021   Procedure: CYSTOSCOPY WITH STENT PLACEMENT; WITH RETROGRADE PYELOGRAM;  Surgeon: Hollice Espy, MD;  Location: [facility];  Service: Urology;  Laterality: Right;   CYSTOSCOPY/RETROGRADE/URETEROSCOPY/STONE EXTRACTION WITH BASKET  09/21/2021   Procedure: CYSTOSCOPY/RETROGRADE/URETEROSCOPY/STONE EXTRACTION WITH BASKET;  Surgeon: Hollice Espy, MD;  Location: [facility];  Service: Urology;;   ESOPHAGOGASTRODUODENOSCOPY (EGD) WITH PROPOFOL N/A 10/07/2021   Procedure: ESOPHAGOGASTRODUODENOSCOPY (EGD) WITH PROPOFOL;  Surgeon: Lin Landsman, MD;  Location: [facility];  Service: Gastroenterology;  Laterality: N/A;   SUPRACERVICAL ABDOMINAL HYSTERECTOMY  2006   has ovaries. Had heavy periods and was  anemic. HAS cervix   TONSILLECTOMY  1966   Family History  Problem Relation Age of Onset   Congestive Heart Failure Mother    CVA Mother 12   Colon cancer Father    Esophageal cancer Maternal Uncle    Thyroid cancer Neg Hx     Allergies: Pneumovax 23 [pneumococcal vac polyvalent] and Prevnar 13 [pneumococcal 13-val conj vacc] Current Outpatient Medications on File Prior to Visit  Medication Sig Dispense Refill   Accu-Chek FastClix Lancets MISC Used to check blood sugars twice a day. 100 each 3   aspirin EC 81 MG tablet Take 81 mg by mouth daily.     atorvastatin (LIPITOR) 40 MG tablet TAKE 1 TABLET BY MOUTH EVERY DAY 90 tablet 3   Blood Glucose Monitoring Suppl (ACCU-CHEK GUIDE ME) w/Device KIT USE AS DIRECTED 1 kit 0   Calcium-Vitamin D-Vitamin K (VIACTIV CALCIUM PLUS D PO) Take 1 tablet by mouth daily.     Cholecalciferol (VITAMIN D3 PO) Take 400 mg by mouth daily.     citalopram (CELEXA) 20 MG tablet TAKE 1 TABLET BY MOUTH EVERY DAY 90 tablet 2   famotidine (PEPCID) 20 MG tablet Take 20 mg by mouth at bedtime.     ferrous sulfate 325 (65 FE) MG EC tablet Take 325 mg by mouth 3 (three) times daily with meals.     glucose blood (ACCU-CHEK AVIVA PLUS) test strip Used to check blood sugars twice a day. 100 each 5   Insulin Pen Needle (PEN NEEDLES) 32G X 4 MM MISC Used to give Victoza injections.  100 each 1   Lancets (ONETOUCH ULTRASOFT) lancets Used to check blood sugars once a day. 100 each 4   lisinopril (ZESTRIL) 5 MG tablet Take 1 tablet (5 mg total) by mouth daily. 90 tablet 1   metFORMIN (GLUCOPHAGE) 1000 MG tablet TAKE 1 TABLET (1,000 MG TOTAL) BY MOUTH 2 (TWO) TIMES DAILY WITH A MEAL. 180 tablet 1   Multiple Vitamins-Minerals (CENTRUM SILVER ADULT 50+ PO) Take 1 tablet by mouth daily.     ondansetron (ZOFRAN ODT) 4 MG disintegrating tablet Take 1 tablet (4 mg total) by mouth every 8 (eight) hours as needed. 30 tablet 1   pantoprazole (PROTONIX) 20 MG tablet TAKE 1 TABLET (20 MG  TOTAL) BY MOUTH 2 (TWO) TIMES DAILY BEFORE A MEAL. (Patient taking differently: Take 20 mg by mouth daily.) 60 tablet 3   Semaglutide, 1 MG/DOSE, (OZEMPIC, 1 MG/DOSE,) 4 MG/3ML SOPN Inject 1 mg into the skin once a week. 9 mL 1   No current facility-administered medications on file prior to visit.    Social History   Tobacco Use   Smoking status: Never   Smokeless tobacco: Never  Vaping Use   Vaping Use: Never used  Substance Use Topics   Alcohol use: Not Currently   Drug use: Never    Review of Systems  Constitutional:  Negative for chills and fever.  Respiratory:  Negative for cough.   Cardiovascular:  Negative for chest pain and palpitations.  Gastrointestinal:  Negative for nausea and vomiting.      Objective:    BP 138/82 (BP Location: Left Arm, Patient Position: Sitting, Cuff Size: Normal)   Pulse 73   Temp 97.9 F (36.6 C) (Oral)   Ht 6' (1.829 m)   Wt 177 lb 12.8 oz (80.6 kg)   SpO2 99%   BMI 24.11 kg/m  BP Readings from Last 3 Encounters:  05/28/22 138/82  05/10/22 138/78  05/06/22 (!) 142/76   Wt Readings from Last 3 Encounters:  05/28/22 177 lb 12.8 oz (80.6 kg)  05/10/22 182 lb (82.6 kg)  05/06/22 182 lb (82.6 kg)    Physical Exam Vitals reviewed.  Constitutional:      Appearance: She is well-developed.  Eyes:     Conjunctiva/sclera: Conjunctivae normal.  Cardiovascular:     Rate and Rhythm: Normal rate and regular rhythm.     Pulses: Normal pulses.     Heart sounds: Normal heart sounds.  Pulmonary:     Effort: Pulmonary effort is normal.     Breath sounds: Normal breath sounds. No wheezing, rhonchi or rales.  Skin:    General: Skin is warm and dry.  Neurological:     Mental Status: She is alert.  Psychiatric:        Speech: Speech normal.        Behavior: Behavior normal.        Thought Content: Thought content normal.        Assessment & Plan:   Problem List Items Addressed This Visit       Respiratory   LLL pneumonia -  Primary    Clinical resolution. I have ordered CT chest without contrast due to to be scheduled in 2 to 3 months.  Patient will let me know how she is doing.  We did discuss if symptoms were to recur, we could consider Symbicort inhaler or prednisone for any residual inflammation      Relevant Orders   CT Chest Wo Contrast     Endocrine  DM (diabetes mellitus) ([place])   Relevant Orders   Hemoglobin A1c   Other Visit Diagnoses     Anemia, unspecified type       Relevant Orders   CBC with Differential/Platelet   IBC + Ferritin        I have discontinued Lydie Panda. [place]'s doxycycline, azithromycin, predniSONE, and chlorpheniramine-HYDROcodone. I am also having her maintain her Multiple Vitamins-Minerals (CENTRUM SILVER ADULT 50+ PO), aspirin EC, onetouch ultrasoft, Pen Needles, Calcium-Vitamin D-Vitamin K (VIACTIV CALCIUM PLUS D PO), Accu-Chek Aviva Plus, Accu-Chek FastClix Lancets, Accu-Chek Guide Me, famotidine, pantoprazole, atorvastatin, Ozempic (1 MG/DOSE), citalopram, ferrous sulfate, lisinopril, metFORMIN, ondansetron, and Cholecalciferol (VITAMIN D3 PO).   No orders of the defined types were placed in this encounter.   Return precautions given.   Risks, benefits, and alternatives of the medications and treatment plan prescribed today were discussed, and patient expressed understanding.   Education regarding symptom management and diagnosis given to patient on AVS.  Continue to follow with Burnard Hawthorne, FNP for routine health maintenance.   [name] and I agreed with plan.   Mable Paris, FNP

## 2022-06-03 ENCOUNTER — Ambulatory Visit (INDEPENDENT_AMBULATORY_CARE_PROVIDER_SITE_OTHER): Payer: Medicare HMO

## 2022-06-03 DIAGNOSIS — E538 Deficiency of other specified B group vitamins: Secondary | ICD-10-CM | POA: Diagnosis not present

## 2022-06-03 MED ORDER — CYANOCOBALAMIN 1000 MCG/ML IJ SOLN
1000.0000 ug | Freq: Once | INTRAMUSCULAR | Status: AC
  Administered 2022-06-03: 1000 ug via INTRAMUSCULAR

## 2022-06-03 NOTE — Progress Notes (Signed)
Patient presented for B 12 injection to left deltoid, patient voiced no concerns nor showed any signs of distress during injection. 

## 2022-06-04 ENCOUNTER — Ambulatory Visit: Payer: Medicare HMO

## 2022-06-07 ENCOUNTER — Encounter: Payer: Self-pay | Admitting: Family

## 2022-06-07 ENCOUNTER — Other Ambulatory Visit: Payer: Self-pay | Admitting: Family

## 2022-06-07 DIAGNOSIS — J189 Pneumonia, unspecified organism: Secondary | ICD-10-CM

## 2022-06-07 MED ORDER — BUDESONIDE-FORMOTEROL FUMARATE 80-4.5 MCG/ACT IN AERO: 2.0000 | INHALATION_SPRAY | Freq: Two times a day (BID) | RESPIRATORY_TRACT | 3 refills | Status: DC

## 2022-06-07 MED ORDER — PREDNISONE 10 MG PO TABS: ORAL_TABLET | ORAL | 0 refills | Status: DC

## 2022-06-25 NOTE — Progress Notes (Signed)
abstraction

## 2022-06-28 ENCOUNTER — Other Ambulatory Visit: Payer: Self-pay | Admitting: Family

## 2022-06-28 NOTE — Progress Notes (Unsigned)
abstract

## 2022-07-01 ENCOUNTER — Telehealth: Payer: Self-pay

## 2022-07-01 ENCOUNTER — Other Ambulatory Visit: Payer: Self-pay

## 2022-07-01 MED ORDER — OZEMPIC (1 MG/DOSE) 4 MG/3ML ~~LOC~~ SOPN: PEN_INJECTOR | SUBCUTANEOUS | 1 refills | Status: DC

## 2022-07-01 NOTE — Telephone Encounter (Signed)
Refill sent to pharmacy. Patient was notified.  

## 2022-07-01 NOTE — Telephone Encounter (Signed)
Patient states she needs a refill for her OZEMPIC, 1 MG/DOSE, 4 MG/3ML SOPN.  *Patient states her preferred pharmacy is [facility] [address] in [place].

## 2022-07-06 ENCOUNTER — Ambulatory Visit (INDEPENDENT_AMBULATORY_CARE_PROVIDER_SITE_OTHER): Payer: Medicare HMO

## 2022-07-06 DIAGNOSIS — E538 Deficiency of other specified B group vitamins: Secondary | ICD-10-CM

## 2022-07-06 MED ORDER — CYANOCOBALAMIN 1000 MCG/ML IJ SOLN
1000.0000 ug | Freq: Once | INTRAMUSCULAR | Status: AC
  Administered 2022-07-06: 1000 ug via INTRAMUSCULAR

## 2022-07-06 NOTE — Progress Notes (Signed)
Pt arrived for B12 injection, given in R deltoid. Pt tolerated injection well, showed no signs of distress nor voiced any concerns.  

## 2022-07-18 ENCOUNTER — Other Ambulatory Visit: Payer: Self-pay | Admitting: Family

## 2022-07-18 DIAGNOSIS — I10 Essential (primary) hypertension: Secondary | ICD-10-CM

## 2022-07-18 DIAGNOSIS — E119 Type 2 diabetes mellitus without complications: Secondary | ICD-10-CM

## 2022-07-26 ENCOUNTER — Other Ambulatory Visit (INDEPENDENT_AMBULATORY_CARE_PROVIDER_SITE_OTHER): Payer: Medicare HMO

## 2022-07-26 DIAGNOSIS — D649 Anemia, unspecified: Secondary | ICD-10-CM | POA: Diagnosis not present

## 2022-07-26 DIAGNOSIS — E119 Type 2 diabetes mellitus without complications: Secondary | ICD-10-CM | POA: Diagnosis not present

## 2022-07-26 LAB — CBC WITH DIFFERENTIAL/PLATELET
Basophils Absolute: 0 10*3/uL (ref 0.0–0.1)
Basophils Relative: 0.6 % (ref 0.0–3.0)
Eosinophils Absolute: 0.3 10*3/uL (ref 0.0–0.7)
Eosinophils Relative: 5.2 % — ABNORMAL HIGH (ref 0.0–5.0)
HCT: 31.7 % — ABNORMAL LOW (ref 36.0–46.0)
Hemoglobin: 11 g/dL — ABNORMAL LOW (ref 12.0–15.0)
Lymphocytes Relative: 26.6 % (ref 12.0–46.0)
Lymphs Abs: 1.5 10*3/uL (ref 0.7–4.0)
MCHC: 34.9 g/dL (ref 30.0–36.0)
MCV: 83.8 fl (ref 78.0–100.0)
Monocytes Absolute: 0.3 10*3/uL (ref 0.1–1.0)
Monocytes Relative: 5.3 % (ref 3.0–12.0)
Neutro Abs: 3.5 10*3/uL (ref 1.4–7.7)
Neutrophils Relative %: 62.3 % (ref 43.0–77.0)
Platelets: 171 10*3/uL (ref 150.0–400.0)
RBC: 3.78 Mil/uL — ABNORMAL LOW (ref 3.87–5.11)
RDW: 14 % (ref 11.5–15.5)
WBC: 5.6 10*3/uL (ref 4.0–10.5)

## 2022-07-26 LAB — IBC + FERRITIN
Ferritin: 21.3 ng/mL (ref 10.0–291.0)
Iron: 54 ug/dL (ref 42–145)
Saturation Ratios: 16.1 % — ABNORMAL LOW (ref 20.0–50.0)
TIBC: 336 ug/dL (ref 250.0–450.0)
Transferrin: 240 mg/dL (ref 212.0–360.0)

## 2022-07-26 LAB — HEMOGLOBIN A1C: Hgb A1c MFr Bld: 7.3 % — ABNORMAL HIGH (ref 4.6–6.5)

## 2022-07-28 ENCOUNTER — Encounter: Payer: Self-pay | Admitting: Family

## 2022-07-28 ENCOUNTER — Ambulatory Visit (INDEPENDENT_AMBULATORY_CARE_PROVIDER_SITE_OTHER): Payer: Medicare HMO | Admitting: Family

## 2022-07-28 VITALS — BP 126/76 | HR 66 | Temp 98.1°F | Ht 70.0 in | Wt 174.0 lb

## 2022-07-28 DIAGNOSIS — L989 Disorder of the skin and subcutaneous tissue, unspecified: Secondary | ICD-10-CM

## 2022-07-28 DIAGNOSIS — E119 Type 2 diabetes mellitus without complications: Secondary | ICD-10-CM | POA: Diagnosis not present

## 2022-07-28 DIAGNOSIS — M79601 Pain in right arm: Secondary | ICD-10-CM

## 2022-07-28 DIAGNOSIS — M79603 Pain in arm, unspecified: Secondary | ICD-10-CM | POA: Insufficient documentation

## 2022-07-28 MED ORDER — MELOXICAM 7.5 MG PO TABS: 7.5000 mg | ORAL_TABLET | Freq: Every day | ORAL | 1 refills | Status: DC | PRN

## 2022-07-28 NOTE — Assessment & Plan Note (Signed)
New lesion, easy to bleed. Concern for BCC based on pink appearance. May be excoriated nevus. Referral to dermatology.

## 2022-07-28 NOTE — Progress Notes (Signed)
Subjective:    Patient ID: Danielle Dickerson, female    DOB: Sep 06, 1956, 66 y.o.   MRN: [id number]  CC: Danielle Dickerson is a 66 y.o. female who presents today for follow up.   HPI: Right posterior bicep pain after doing arm pushup x 4 weeks  She also noted after walking dog and he pullled her arm x 4 weeks ago.  No swelling, bruising. Pain with internal rotation. Able to play pickleball without pain.   No swelling,audible sound, shoulder, numbness.     Hasnt tried ice, nsaids.   New left cheek lesion She will pick at it and it will bleed.  No h/o skin cancer  She been using powder to stop bleeding which is made for a dog.   Area is non tender.      Anemia- compliant with ferrous sulfate 3243m QD; she has not been taking BID.   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   Eosinophilic esophagitis    Esophageal stricture 2019   GERD (gastroesophageal reflux disease)    Hyperlipidemia    Pernicious anemia    Past Surgical History:  Procedure Laterality Date   BREAST EXCISIONAL BIOPSY Right 2009   phyllodes removed    BREAST SURGERY     CESAREAN SECTION     CHOLECYSTECTOMY OPEN     COLONOSCOPY WITH PROPOFOL N/A 02/02/2021   Procedure: COLONOSCOPY WITH PROPOFOL;  Surgeon: VanLin LandsmanD;  [facility];  Laterality: N/A;   CYSTOSCOPY WITH STENT PLACEMENT Right 09/07/2021   Procedure: CYSTOSCOPY WITH STENT PLACEMENT; WITH RETROGRADE PYELOGRAM;  Surgeon: BraHollice EspyD;  Location: [facility];  Service: Urology;  Laterality: Right;   CYSTOSCOPY/RETROGRADE/URETEROSCOPY/STONE EXTRACTION WITH BASKET  09/21/2021   Procedure: CYSTOSCOPY/RETROGRADE/URETEROSCOPY/STONE EXTRACTION WITH BASKET;  Surgeon: BraHollice EspyD;  Location: [facility];  Service: Urology;;    ESOPHAGOGASTRODUODENOSCOPY (EGD) WITH PROPOFOL N/A 10/07/2021   Procedure: ESOPHAGOGASTRODUODENOSCOPY (EGD) WITH PROPOFOL;  Surgeon: VanLin LandsmanD;  [facility];  Service: Gastroenterology;  Laterality: N/A;   SUPRACERVICAL ABDOMINAL HYSTERECTOMY  2006   has ovaries. Had heavy periods and was anemic. HAS cervix   TONSILLECTOMY  1966   Family History  Problem Relation Age of Onset   Congestive Heart Failure Mother    CVA Mother 88 75Colon cancer Father    Esophageal cancer Maternal Uncle    Thyroid cancer Neg Hx     Allergies: Pneumovax 23 [pneumococcal vac polyvalent] and Prevnar 13 [pneumococcal 13-val conj vacc] Current Outpatient Medications on File Prior to Visit  Medication Sig Dispense Refill   Accu-Chek FastClix Lancets MISC Used to check blood sugars twice a day. 100 each 3   aspirin EC 81 MG tablet Take 81 mg by mouth daily.     atorvastatin (LIPITOR) 40 MG tablet TAKE 1 TABLET BY MOUTH EVERY DAY 90 tablet 3   Blood Glucose Monitoring Suppl (ACCU-CHEK GUIDE ME) w/Device KIT USE AS DIRECTED 1 kit 0   budesonide-formoterol (SYMBICORT) 80-4.5 MCG/ACT inhaler Inhale 2 puffs into the lungs 2 (two) times daily. 1 each 3   Calcium-Vitamin D-Vitamin K (VIACTIV CALCIUM PLUS D PO) Take 1 tablet by mouth daily.     Cholecalciferol (VITAMIN D3 PO) Take 400 mg by mouth daily.     citalopram (CELEXA)  20 MG tablet TAKE 1 TABLET BY MOUTH EVERY DAY 90 tablet 2   famotidine (PEPCID) 20 MG tablet Take 20 mg by mouth at bedtime.     ferrous sulfate 325 (65 FE) MG EC tablet Take 325 mg by mouth 3 (three) times daily with meals.     glucose blood (ACCU-CHEK AVIVA PLUS) test strip Used to check blood sugars twice a day. 100 each 5   Insulin Pen Needle (PEN NEEDLES) 32G X 4 MM MISC Used to give Victoza injections. 100 each 1   Lancets (ONETOUCH ULTRASOFT) lancets Used to check blood sugars once a day. 100 each 4   lisinopril (ZESTRIL) 5 MG tablet TAKE 1 TABLET (5 MG TOTAL) BY  MOUTH DAILY. 90 tablet 1   metFORMIN (GLUCOPHAGE) 1000 MG tablet TAKE 1 TABLET (1,000 MG TOTAL) BY MOUTH 2 (TWO) TIMES DAILY WITH A MEAL. 180 tablet 1   Multiple Vitamins-Minerals (CENTRUM SILVER ADULT 50+ PO) Take 1 tablet by mouth daily.     ondansetron (ZOFRAN ODT) 4 MG disintegrating tablet Take 1 tablet (4 mg total) by mouth every 8 (eight) hours as needed. 30 tablet 1   pantoprazole (PROTONIX) 20 MG tablet TAKE 1 TABLET (20 MG TOTAL) BY MOUTH 2 (TWO) TIMES DAILY BEFORE A MEAL. (Patient taking differently: Take 20 mg by mouth daily.) 60 tablet 3   predniSONE (DELTASONE) 10 MG tablet Take 40 mg by mouth on day 1, then taper 10 mg daily until gone 10 tablet 0   Semaglutide, 1 MG/DOSE, (OZEMPIC, 1 MG/DOSE,) 4 MG/3ML SOPN INJECT 1MG INTO THE SKIN ONCE A WEEK 9 mL 1   No current facility-administered medications on file prior to visit.    Social History   Tobacco Use   Smoking status: Never   Smokeless tobacco: Never  Vaping Use   Vaping Use: Never used  Substance Use Topics   Alcohol use: Not Currently   Drug use: Never    Review of Systems  Constitutional:  Negative for chills and fever.  Respiratory:  Negative for cough.   Cardiovascular:  Negative for chest pain and palpitations.  Gastrointestinal:  Negative for nausea and vomiting.  Musculoskeletal:  Positive for arthralgias. Negative for joint swelling.      Objective:    BP 126/76 (BP Location: Left Arm, Patient Position: Sitting, Cuff Size: Normal)   Pulse 66   Temp 98.1 F (36.7 C) (Oral)   Ht _0  (1.778 m)   Wt 174 lb (78.9 kg)   SpO2 99%   BMI 24.97 kg/m  BP Readings from Last 3 Encounters:  07/28/22 126/76  05/28/22 138/82  05/10/22 138/78   Wt Readings from Last 3 Encounters:  07/28/22 174 lb (78.9 kg)  05/28/22 177 lb 12.8 oz (80.6 kg)  05/10/22 182 lb (82.6 kg)    Physical Exam Vitals reviewed.  Constitutional:      Appearance: She is well-developed.  Eyes:     Conjunctiva/sclera:  Conjunctivae normal.  Cardiovascular:     Rate and Rhythm: Normal rate and regular rhythm.     Pulses: Normal pulses.     Heart sounds: Normal heart sounds.  Pulmonary:     Effort: Pulmonary effort is normal.     Breath sounds: Normal breath sounds. No wheezing, rhonchi or rales.  Musculoskeletal:     Right shoulder: No swelling or deformity. Normal range of motion.     Left shoulder: No swelling or deformity. Normal range of motion.     Comments: Right Shoulder:  No asymmetry of shoulders when comparing right and left.No pain with palpation over glenohumeral joint lines, [place] joint, AC joint.   Pain over proximal bicipital groove. No pain with internal and external rotation. No pain with resisted lateral extension .   Negative active painful arc sign. Negative passive arc ( Neer's). Negative drop arm.   No pain, swelling, or ecchymosis noted over long head of biceps.  No bicep pain with resisted flexion and extension.  Negative speeds test. Negative Popeye's sign. No sagging biceps muscle .  Strength and sensation normal BUE's.   Skin:    General: Skin is warm and dry.          Comments: 2cm pink papule, bleeding  Neurological:     Mental Status: She is alert.  Psychiatric:        Speech: Speech normal.        Behavior: Behavior normal.        Thought Content: Thought content normal.        Assessment & Plan:   Problem List Items Addressed This Visit       Endocrine   DM (diabetes mellitus) ([place])    Lab Results  Component Value Date   HGBA1C 7.3 (H) 07/26/2022  Suboptimal control.  We jointly agreed to defer medication changes at this time as she is tolerating regimen and I anticipate will improve.  We will continue to monitor A1c.  Continue metformin 1031m BID, ozempic 11m       Musculoskeletal and Integument   Skin lesion    New lesion, easy to bleed. Concern for BCC based on pink appearance. May be excoriated nevus. Referral to dermatology.        Relevant Orders   Ambulatory referral to Dermatology     Other   Arm pain - Primary    Presentation consistent with biceps tendinopathy with pain over bicipital groove, pain with internal rotation.  We agreed to start with conservative measures at this time including icing regimen, meloxicam.  If no improvement patient would likely benefit from sports medicine consult with ultrasound.  She will let me know how she is doing.      Relevant Medications   meloxicam (MOBIC) 7.5 MG tablet     I am having LaLaurie PandaArnold start on meloxicam. I am also having her maintain her Multiple Vitamins-Minerals (CENTRUM SILVER ADULT 50+ PO), aspirin EC, onetouch ultrasoft, Pen Needles, Calcium-Vitamin D-Vitamin K (VIACTIV CALCIUM PLUS D PO), Accu-Chek Aviva Plus, Accu-Chek FastClix Lancets, Accu-Chek Guide Me, famotidine, pantoprazole, atorvastatin, citalopram, ferrous sulfate, metFORMIN, ondansetron, Cholecalciferol (VITAMIN D3 PO), budesonide-formoterol, predniSONE, Ozempic (1 MG/DOSE), and lisinopril.   Meds ordered this encounter  Medications   meloxicam (MOBIC) 7.5 MG tablet    Sig: Take 1 tablet (7.5 mg total) by mouth daily as needed for pain.    Dispense:  30 tablet    Refill:  1    Order Specific Question:   Supervising Provider    Answer:   TUCrecencio [id number]]    Return precautions given.   Risks, benefits, and alternatives of the medications and treatment plan prescribed today were discussed, and patient expressed understanding.   Education regarding symptom management and diagnosis given to patient on AVS.  Continue to follow with ArBurnard HawthorneFNP for routine health maintenance.   LaRoosvelt Harpsnd I agreed with plan.   MaMable ParisFNP

## 2022-07-28 NOTE — Assessment & Plan Note (Addendum)
Presentation consistent with biceps tendinopathy with pain over bicipital groove, pain with internal rotation.  We agreed to start with conservative measures at this time including icing regimen, meloxicam.  If no improvement patient would likely benefit from sports medicine consult with ultrasound.  She will let me know how she is doing.

## 2022-08-03 NOTE — Patient Instructions (Addendum)
For right arm pain, as discussed ice for 20 minutes 3 times a day.  May use as needed anti-inflammatory, mobic, as prescribed.   A couple of points in regards to meloxicam ( Mobic) -  This medication is not intended for daily , long term use. It is a potent anti inflammatory ( NSAID), and my intention is for you take as needed for moderate to severe pain. If you find yourself using daily, please let me know.   Please takes Mobic ( meloxicam) with FOOD since it is an anti-inflammatory as it can cause a GI bleed or ulcer. If you have a history of GI bleed or ulcer, please do NOT take.  Do no take over the counter aleve, motrin, advil, goody's powder for pain as they are also NSAIDs, and they are  in the same class as Mobic  Lastly, we will need to monitor kidney function while on Mobic, and if we were to see any decline in kidney function in the future, we would have to discontinue this medication.    Please let me know arm pain does not improve.  Proximal Biceps Tendinitis and Tenosynovitis Rehab  Ask your health care provider which exercises are safe for you. Do exercises exactly as told by your health care provider and adjust them as directed. It is normal to feel mild stretching, pulling, tightness, or discomfort as you do these exercises. Stop right away if you feel sudden pain or your pain gets worse. Do not begin these exercises until told by your health care provider. Stretching and range-of-motion exercises These exercises warm up your muscles and joints and improve the movement and flexibility of your arm and shoulder. The exercises also help to relieve pain and stiffness. Forearm rotation Stand or sit with your left / right elbow bent in a 90-degree angle (right angle). Position your forearm so that the thumb is facing the ceiling (neutral position). Rotate your palm up until it cannot go any farther. Hold this position for __________ seconds. Rotate your palm down until it cannot go  any farther. Hold this position for __________ seconds. Repeat __________ times. Complete this exercise __________ times a day. Elbow range of motion Stand or sit with your left / right elbow bent in a 90-degree angle (right angle). Position your forearm so that the thumb is facing the ceiling (neutral position). Slowly bend your elbow as far as you can until you feel a stretch or cannot go any farther. Hold this position for __________ seconds. Slowly straighten your elbow as far as you can until you feel a stretch or cannot go any farther. Hold this position for __________ seconds. Repeat __________ times. Complete this exercise __________ times a day. Biceps stretch Stand facing a wall, or stand by a door frame. Raise your left / right arm out to your side, to your shoulder height. Place the thumb side of your hand against the wall. Your palm should be facing the floor (palm down). Keeping your arm straight, rotate your body in the opposite direction of the raised arm until you feel a gentle stretch in your biceps. Hold this position for __________ seconds. Slowly return to the starting position. Repeat __________ times. Complete this exercise __________ times a day. Shoulder pendulum  Stand near a table or counter that you can hold onto for balance. Bend forward at the waist and let your left / right arm hang straight down. Use your other arm to support you and help you stay balanced. Relax  your left / right arm and shoulder muscles, and move your hips and your trunk so your left / right arm swings freely. Your arm should swing because of the motion of your body, not because you are using your arm or shoulder muscles. Keep moving your hips and trunk so your arm swings in the following directions, as told by your health care provider: Side to side. Forward and backward. In clockwise and counterclockwise circles. Repeat __________ times. Complete this exercise __________ times a  day. Shoulder flexion, assisted  Stand facing a wall. Put your left / right palm on the wall. Slowly move your left / right hand up the wall (flexion). Stop when you feel a stretch in your shoulder, or when you reach the angle that is recommended by your health care provider. Use your other hand to help raise your arm, if needed (assisted). As your hand gets higher, you may need to step closer to the wall. Avoid shrugging or lifting your shoulder up as you raise your arm. To do this, keep your shoulder blade tucked down toward your spine. Hold this position for __________ seconds. Slowly return to the starting position. Use your other arm to help, if needed. Repeat __________ times. Complete this exercise __________ times a day. Shoulder flexion Stand with your left / right arm hanging down at your side. Keep your arm straight as you lift your arm forward and toward the ceiling (flexion). Hold this position for __________ seconds. Slowly return to the starting position. Repeat __________ times. Complete this exercise __________ times a day. Sleeper stretch, assisted Lie on your left / right side (injured side) with your hips and knees bent and your left / right arm straight in front of you. Bend your elbow to a 90-degree angle (right angle), so your fingers are pointing to the ceiling. Use your other hand to gently push your arm toward the floor (assisted), stopping when you feel a gentle stretch. Keep your shoulder blades lightly squeezed together during the exercise. Hold this position for __________ seconds. Slowly return to the starting position. Repeat __________ times. Complete this exercise __________ times a day. Strengthening exercises These exercises build strength and endurance in your arm and shoulder. Endurance is the ability to use your muscles for a long time, even after they get tired. Biceps curls You can use a weight or an exercise band for this exercise. Sit on a  stable chair without armrests, or stand up. Hold a __________ lb / kg weight in your left / right hand, or hold an exercise band with both hands. Your palms should face up toward the ceiling at the starting position. Bend your left / right elbow and move your hand up toward your shoulder. Keep your other arm straight down, in the starting position. Hold this position for __________ seconds. Slowly return to the starting position. Repeat __________ times. Complete this exercise __________ times a day. Internal shoulder rotation You will use an exercise band secured to a stable object at waist height for this exercise. A door and doorframe work well. Stand sideways next to a door with your left / right arm closest to the door, holding the exercise band in your hand. With your elbow bent in a 90-degree angle (right angle) and keeping your elbow at your side, bring your hand toward your belly (internal rotation). Make sure your wrist is staying straight as you do this exercise. Hold this position for __________ seconds. Slowly return to the starting position. Repeat  __________ times. Complete this exercise __________ times a day. External shoulder rotation You will use an exercise band secured to a stable object at waist height for this exercise. A door and doorframe work well. Stand sideways next to a door with your left / right arm away from the door, holding the exercise band in your hand. With your elbow bent in a 90-degree angle (right angle) and keeping your elbow at your side, swing your arm away from your body (external rotation). Make sure your wrist is staying straight as you do this exercise. Hold this position for __________ seconds. Slowly return to the starting position. Repeat __________ times. Complete this exercise __________ times a day. External shoulder rotation, side-lying You will use a weight to do this exercise. Lie on your uninjured side with your left / right arm at your  side. Bend your elbow to a 90-degree angle (right angle). Hold a __________ lb / kg weight in your left / right hand. Keeping your elbow at your side, raise your arm toward the ceiling (external rotation). Make sure your wrist is staying straight as you do this exercise. Hold this position for __________ seconds. Slowly return to the starting position. Repeat __________ times. Complete this exercise __________ times a day. Scapular retraction Scapular retraction is the process of pulling the shoulder blades (scapulae) toward each other, and toward the spine. You will need an exercise band to do this exercise. Sit in a stable chair without armrests, or stand up. Secure an exercise band to a stable object in front of you so the band is at shoulder height. Hold one end of the exercise band in each hand. Squeeze your shoulder blades together and move your elbows slightly behind you (retraction). Do not shrug your shoulders upward while you do this. Hold this position for __________ seconds. Slowly return to the starting position. Repeat __________ times. Complete this exercise __________ times a day. Scapular protraction, supine Scapular protraction is the process of moving your shoulder blades away from each other, and away from the spine, while you lie on your back (supine position). Lie on your back on a firm surface. Hold a __________ lb / kg weight in your left / right hand. Raise your left / right arm straight into the air so your hand is directly above your shoulder joint. Push the weight into the air so your shoulder (scapula) lifts off the surface that you are lying on. Think of trying to punch the ceiling by only moving your scapula forward (protraction). Do not move your head, neck, or back. Hold this position for __________ seconds. Slowly return to the starting position. Repeat __________ times. Complete this exercise __________ times a day. This information is not intended to replace  advice given to you by your health care provider. Make sure you discuss any questions you have with your health care provider. Document Revised: 01/07/2021 Document Reviewed: 01/07/2021 Elsevier Patient Education  2023 [employer].

## 2022-08-03 NOTE — Assessment & Plan Note (Signed)
Lab Results  Component Value Date   HGBA1C 7.3 (H) 07/26/2022  Suboptimal control.  We jointly agreed to defer medication changes at this time as she is tolerating regimen and I anticipate will improve.  We will continue to monitor A1c.  Continue metformin 1000mg  BID, ozempic 1mg 

## 2022-08-04 ENCOUNTER — Ambulatory Visit
Admission: RE | Admit: 2022-08-04 | Discharge: 2022-08-04 | Disposition: A | Discharge status: Home / Self Care | Payer: Medicare HMO | Source: Ambulatory Visit | Attending: Family | Admitting: Family

## 2022-08-04 DIAGNOSIS — J189 Pneumonia, unspecified organism: Secondary | ICD-10-CM | POA: Insufficient documentation

## 2022-08-05 ENCOUNTER — Encounter: Payer: Self-pay | Admitting: Family

## 2022-08-06 ENCOUNTER — Ambulatory Visit (INDEPENDENT_AMBULATORY_CARE_PROVIDER_SITE_OTHER): Payer: Medicare HMO | Admitting: *Deleted

## 2022-08-06 DIAGNOSIS — E538 Deficiency of other specified B group vitamins: Secondary | ICD-10-CM | POA: Diagnosis not present

## 2022-08-06 MED ORDER — CYANOCOBALAMIN 1000 MCG/ML IJ SOLN
1000.0000 ug | Freq: Once | INTRAMUSCULAR | Status: AC
  Administered 2022-08-06: 1000 ug via INTRAMUSCULAR

## 2022-08-06 NOTE — Progress Notes (Signed)
Pt received B12 injection in left deltoid & tolerated it well with no concerns or complaints.  

## 2022-08-09 ENCOUNTER — Other Ambulatory Visit: Payer: Self-pay | Admitting: Family

## 2022-08-09 ENCOUNTER — Telehealth: Payer: Self-pay

## 2022-08-09 DIAGNOSIS — L989 Disorder of the skin and subcutaneous tissue, unspecified: Secondary | ICD-10-CM

## 2022-08-09 NOTE — Telephone Encounter (Signed)
Patient called back and got an appointment with Dermatology. Please cancel note below.

## 2022-08-09 NOTE — Telephone Encounter (Signed)
Spoke to patient she stated that she has appt with [facility]

## 2022-08-09 NOTE — Telephone Encounter (Signed)
Call pt  Please ask which dermatologist she is seeing and when  I want to follow along

## 2022-08-09 NOTE — Telephone Encounter (Signed)
LVM to call back.

## 2022-08-09 NOTE — Telephone Encounter (Signed)
NOTED

## 2022-08-09 NOTE — Telephone Encounter (Signed)
Patient states we referred her to [facility] for the place on her face, but they cannot see her until November.  Patient states the place on her face broke open last night, it's red, hurts, bleeding, and has gotten bigger.  Patient states she would like for [place] to refer her to [facility] and write "Urgent Referral" on it so they can review and see if they can get her in sooner.

## 2022-08-17 ENCOUNTER — Telehealth: Payer: Self-pay | Admitting: Family

## 2022-08-17 NOTE — Telephone Encounter (Signed)
-----   Message from Tyler Pita, MD sent at 08/09/2022 10:24 AM EDT ----- Danielle Dickerson,  The previously noted opacity on the left lobe has totally cleared on the follow-up CT.  This and the likely represented a patch of pneumonia.  The intra pulmonary lymph node is a benign finding.  Including a reference paper that may be of help:  "Typical CT Features of Intrapulmonary Lymph Nodes: A Review Waldon Merl, MD,corresponding Ferd Glassing, PhD, Erasmo Downer, PhD, Bram Zigmund Daniel, PhD, Cornelia Ezequiel Essex, PhD, and Marcelo Baldy, PhD Radiol Cardiothorac Imaging. 2020 Aug; 2(4): [id number]. Published online 2020 Aug 27. doi: [email or web address]/[email or web address].[id number]"  Hope this helps.  This is a benign finding.  Dr. Darnell Level. ----- Message ----- From: Burnard Hawthorne, FNP Sent: 08/09/2022   6:16 AM EDT To: Tyler Pita, MD  Dr Patsey Berthold,   [facility] you are well. Do you mind looking at patient's follow up CT chest?   I wanted to be sure that 'stable subpleural right middle lobe triangular nodule consistent with an intrapulmonary lymph node ' was not anything that I need to follow at this point.    She had had CT calcium score 05/06/2022 which showed incidental opacity in the left lobe, concerning for pneumonia.  Otherwise no history of lung disease, cancer,  or smoking history.

## 2022-08-17 NOTE — Telephone Encounter (Signed)
noted 

## 2022-08-18 ENCOUNTER — Other Ambulatory Visit: Payer: Self-pay | Admitting: Family

## 2022-08-18 DIAGNOSIS — K219 Gastro-esophageal reflux disease without esophagitis: Secondary | ICD-10-CM

## 2022-08-21 ENCOUNTER — Telehealth: Payer: Medicare HMO | Admitting: Nurse Practitioner

## 2022-08-21 DIAGNOSIS — T50Z95A Adverse effect of other vaccines and biological substances, initial encounter: Secondary | ICD-10-CM

## 2022-08-21 DIAGNOSIS — R208 Other disturbances of skin sensation: Secondary | ICD-10-CM | POA: Diagnosis not present

## 2022-08-21 NOTE — Progress Notes (Signed)
Virtual Visit Consent   Danielle Dickerson, you are scheduled for a virtual visit with Mary-Margaret Hassell Done, [place], a [facility] provider, today.     Just as with appointments in the office, your consent must be obtained to participate.  Your consent will be active for this visit and any virtual visit you may have with one of our providers in the next 365 days.     If you have a MyChart account, a copy of this consent can be sent to you electronically.  All virtual visits are billed to your insurance company just like a traditional visit in the office.    As this is a virtual visit, video technology does not allow for your provider to perform a traditional examination.  This may limit your provider's ability to fully assess your condition.  If your provider identifies any concerns that need to be evaluated in person or the need to arrange testing (such as labs, EKG, etc.), we will make arrangements to do so.     Although advances in technology are sophisticated, we cannot ensure that it will always work on either your end or our end.  If the connection with a video visit is poor, the visit may have to be switched to a telephone visit.  With either a video or telephone visit, we are not always able to ensure that we have a secure connection.     I need to obtain your verbal consent now.   Are you willing to proceed with your visit today? YES   Danielle Dickerson has provided verbal consent on 08/21/2022 for a virtual visit (video or telephone).   Mary-Margaret Hassell Done, FNP   Date: 08/21/2022 4:25 PM   Virtual Visit via Video Note   I, Mary-Margaret Hassell Done, connected with Danielle Dickerson ([id number], 66/27/1957) on 08/21/22 at  4:30 PM EDT by a video-enabled telemedicine application and verified that I am speaking with the correct person using two identifiers.  Location: Patient: Virtual Visit Location Patient: Home Provider: Virtual Visit Location Provider: Mobile   I discussed the limitations  of evaluation and management by telemedicine and the availability of in person appointments. The patient expressed understanding and agreed to proceed.    History of Present Illness: Danielle Dickerson is a 66 y.o. who identifies as a female who was assigned female at birth, and is being seen today for immunization reaction.  HPI: Patient had flu shot and covid vaccine yesterday. She had them both in the same arm. She has a pulled muscle in right upper arm and she did not want to b=get vaccine in that arm. Her arm today is red  hard and hot to touch.     Review of Systems  Constitutional:  Negative for diaphoresis and weight loss.  Eyes:  Negative for blurred vision, double vision and pain.  Respiratory:  Negative for shortness of breath.   Cardiovascular:  Negative for chest pain, palpitations, orthopnea and leg swelling.  Gastrointestinal:  Negative for abdominal pain.  Skin:  Negative for rash.  Neurological:  Negative for dizziness, sensory change, loss of consciousness, weakness and headaches.  Endo/Heme/Allergies:  Negative for polydipsia. Does not bruise/bleed easily.  Psychiatric/Behavioral:  Negative for memory loss. The patient does not have insomnia.   All other systems reviewed and are negative.   Problems:  Patient Active Problem List   Diagnosis Date Noted   Skin lesion 07/28/2022   Arm pain 07/28/2022   LLL pneumonia 05/10/2022   Medicare  annual wellness visit, initial 04/04/2022   HTN (hypertension) 01/22/2022   Fatigue 09/30/2021   Right ureteral stone 09/07/2021   Bilious emesis 08/18/2021   Renal stone 08/18/2021   Pernicious anemia 07/03/2021   Initial Medicare annual wellness visit    Atypical squamous cell changes of undetermined significance (ASCUS) on cervical cytology with negative high risk human papilloma virus (HPV) test result 05/08/2020   DM (diabetes mellitus) ([place]) 01/30/2020   HLD (hyperlipidemia) 01/30/2020   GERD (gastroesophageal reflux disease)  01/30/2020    Allergies:  Allergies  Allergen Reactions   Pneumovax 23 [Pneumococcal Vac Polyvalent] Swelling    Causes arm swelling, redness.   Prevnar 13 [Pneumococcal 13-Val Conj Vacc] Swelling   Medications:  Current Outpatient Medications:    Accu-Chek FastClix Lancets MISC, Used to check blood sugars twice a day., Disp: 100 each, Rfl: 3   aspirin EC 81 MG tablet, Take 81 mg by mouth daily., Disp: , Rfl:    atorvastatin (LIPITOR) 40 MG tablet, TAKE 1 TABLET BY MOUTH EVERY DAY, Disp: 90 tablet, Rfl: 3   Blood Glucose Monitoring Suppl (ACCU-CHEK GUIDE ME) w/Device KIT, USE AS DIRECTED, Disp: 1 kit, Rfl: 0   budesonide-formoterol (SYMBICORT) 80-4.5 MCG/ACT inhaler, Inhale 2 puffs into the lungs 2 (two) times daily., Disp: 1 each, Rfl: 3   Calcium-Vitamin D-Vitamin K (VIACTIV CALCIUM PLUS D PO), Take 1 tablet by mouth daily., Disp: , Rfl:    Cholecalciferol (VITAMIN D3 PO), Take 400 mg by mouth daily., Disp: , Rfl:    citalopram (CELEXA) 20 MG tablet, TAKE 1 TABLET BY MOUTH EVERY DAY, Disp: 90 tablet, Rfl: 2   famotidine (PEPCID) 20 MG tablet, Take 20 mg by mouth at bedtime., Disp: , Rfl:    ferrous sulfate 325 (65 FE) MG EC tablet, Take 325 mg by mouth 3 (three) times daily with meals., Disp: , Rfl:    glucose blood (ACCU-CHEK AVIVA PLUS) test strip, Used to check blood sugars twice a day., Disp: 100 each, Rfl: 5   Insulin Pen Needle (PEN NEEDLES) 32G X 4 MM MISC, Used to give Victoza injections., Disp: 100 each, Rfl: 1   Lancets (ONETOUCH ULTRASOFT) lancets, Used to check blood sugars once a day., Disp: 100 each, Rfl: 4   lisinopril (ZESTRIL) 5 MG tablet, TAKE 1 TABLET (5 MG TOTAL) BY MOUTH DAILY., Disp: 90 tablet, Rfl: 1   meloxicam (MOBIC) 7.5 MG tablet, Take 1 tablet (7.5 mg total) by mouth daily as needed for pain., Disp: 30 tablet, Rfl: 1   metFORMIN (GLUCOPHAGE) 1000 MG tablet, TAKE 1 TABLET (1,000 MG TOTAL) BY MOUTH 2 (TWO) TIMES DAILY WITH A MEAL., Disp: 180 tablet, Rfl: 1    Multiple Vitamins-Minerals (CENTRUM SILVER ADULT 50+ PO), Take 1 tablet by mouth daily., Disp: , Rfl:    ondansetron (ZOFRAN ODT) 4 MG disintegrating tablet, Take 1 tablet (4 mg total) by mouth every 8 (eight) hours as needed., Disp: 30 tablet, Rfl: 1   pantoprazole (PROTONIX) 20 MG tablet, TAKE 1 TABLET (20 MG TOTAL) BY MOUTH 2 (TWO) TIMES DAILY BEFORE A MEAL., Disp: 180 tablet, Rfl: 1   predniSONE (DELTASONE) 10 MG tablet, Take 40 mg by mouth on day 1, then taper 10 mg daily until gone, Disp: 10 tablet, Rfl: 0   Semaglutide, 1 MG/DOSE, (OZEMPIC, 1 MG/DOSE,) 4 MG/3ML SOPN, INJECT 1MG INTO THE SKIN ONCE A WEEK, Disp: 9 mL, Rfl: 1  Observations/Objective: Patient is well-developed, well-nourished in no acute distress.  Resting comfortably  at home.  Head is normocephalic, atraumatic.  No labored breathing.  Speech is clear and coherent with logical content.  Patient is alert and oriented at baseline.  Left arm has orange size erythematous area that is hot o touch and sore.  Assessment and Plan:  Danielle Dickerson in today with chief complaint of immunization reaction   1. Adverse effect of vaccine, initial encounter Ice Benadryl OTC Draw circle around area and if redness worsens let us know.    Follow Up Instructions: I discussed the assessment and treatment plan with the patient. The patient was provided an opportunity to ask questions and all were answered. The patient agreed with the plan and demonstrated an understanding of the instructions.  A copy of instructions were sent to the patient via MyChart.  The patient was advised to call back or seek an in-person evaluation if the symptoms worsen or if the condition fails to improve as anticipated.  Time:  I spent 7 minutes with the patient via telehealth technology discussing the above problems/concerns.    Mary-Margaret Hassell Done, FNP

## 2022-09-07 ENCOUNTER — Ambulatory Visit (INDEPENDENT_AMBULATORY_CARE_PROVIDER_SITE_OTHER): Payer: Medicare HMO

## 2022-09-07 DIAGNOSIS — E538 Deficiency of other specified B group vitamins: Secondary | ICD-10-CM

## 2022-09-07 MED ORDER — CYANOCOBALAMIN 1000 MCG/ML IJ SOLN
1000.0000 ug | Freq: Once | INTRAMUSCULAR | Status: AC
  Administered 2022-09-07: 1000 ug via INTRAMUSCULAR

## 2022-09-07 NOTE — Progress Notes (Signed)
Pt arrived for B12 injection, given in L deltoid. Pt tolerated injection well, showed no signs of distress nor voiced any concerns.  ?

## 2022-09-22 ENCOUNTER — Other Ambulatory Visit: Payer: Self-pay | Admitting: Family

## 2022-09-22 NOTE — Telephone Encounter (Signed)
Refills sent and pt notified.  

## 2022-10-11 ENCOUNTER — Ambulatory Visit (INDEPENDENT_AMBULATORY_CARE_PROVIDER_SITE_OTHER): Payer: Medicare HMO

## 2022-10-11 DIAGNOSIS — E538 Deficiency of other specified B group vitamins: Secondary | ICD-10-CM

## 2022-10-11 MED ORDER — CYANOCOBALAMIN 1000 MCG/ML IJ SOLN
1000.0000 ug | Freq: Once | INTRAMUSCULAR | Status: AC
  Administered 2022-10-11: 1000 ug via INTRAMUSCULAR

## 2022-10-11 NOTE — Progress Notes (Signed)
Pt arrived for B12 injection, given in L deltoid. Pt tolerated injection well, showed no signs of distress nor voiced any concerns.  ?

## 2022-10-29 ENCOUNTER — Ambulatory Visit (INDEPENDENT_AMBULATORY_CARE_PROVIDER_SITE_OTHER): Payer: Medicare HMO | Admitting: Family

## 2022-10-29 ENCOUNTER — Ambulatory Visit (INDEPENDENT_AMBULATORY_CARE_PROVIDER_SITE_OTHER): Payer: Medicare HMO

## 2022-10-29 ENCOUNTER — Encounter: Payer: Self-pay | Admitting: Family

## 2022-10-29 VITALS — BP 128/76 | HR 80 | Temp 97.7°F | Wt 173.0 lb

## 2022-10-29 DIAGNOSIS — I10 Essential (primary) hypertension: Secondary | ICD-10-CM

## 2022-10-29 DIAGNOSIS — E119 Type 2 diabetes mellitus without complications: Secondary | ICD-10-CM

## 2022-10-29 DIAGNOSIS — M79601 Pain in right arm: Secondary | ICD-10-CM

## 2022-10-29 DIAGNOSIS — R7309 Other abnormal glucose: Secondary | ICD-10-CM

## 2022-10-29 LAB — POCT GLYCOSYLATED HEMOGLOBIN (HGB A1C): Hemoglobin A1C: 6.1 % — AB (ref 4.0–5.6)

## 2022-10-29 NOTE — Assessment & Plan Note (Signed)
Chronic, stable.  Continue lisinopril 5 mg QD

## 2022-10-29 NOTE — Assessment & Plan Note (Addendum)
Lab Results  Component Value Date   HGBA1C 6.1 (A) 10/29/2022   Congratulated patient on improvement.  Continue metformin 1000 mg twice daily, Ozempic 1 mg

## 2022-10-29 NOTE — Assessment & Plan Note (Signed)
Duration 8 months without improvement with conservative measures.  Jointly agreed referral to sports medicine.  Pending baseline cervical x-ray, right shoulder x-ray.

## 2022-10-29 NOTE — Progress Notes (Signed)
Subjective:    Patient ID: Danielle Dickerson, female    DOB: [birth year], 66 y.o.   MRN: [id number]  CC: Danielle Dickerson is a 66 y.o. female who presents today for follow up.   HPI: Continues to have right arm pain over right posterior bicep pain x 8 months.   Pain with internal rotation.   Initially thought from arm challenge work out with reverse type plank.       Pain will radiate from right posterior biceps to right hand. She cannot sleep with arm above head.  No numbness, weakness in bilateral arms.  No relief with mobic, ibuprofen.   Arm is not red, swollen.   HTN-compliant with lisinopril 5 mg   DM -compliant with metformin 1000 mg twice daily, Ozempic 1 mg. Doing well regimen.  HISTORY:  Past Medical History:  Diagnosis Date   COVID-19    05/27/21   Depression    Diabetes mellitus without complication ([place])    Type II   Eosinophilic esophagitis    Esophageal stricture 2019   GERD (gastroesophageal reflux disease)    Hyperlipidemia    Pernicious anemia    Past Surgical History:  Procedure Laterality Date   BREAST EXCISIONAL BIOPSY Right 2009   phyllodes removed    BREAST SURGERY     CESAREAN SECTION     CHOLECYSTECTOMY OPEN     COLONOSCOPY WITH PROPOFOL N/A 02/02/2021   Procedure: COLONOSCOPY WITH PROPOFOL;  Surgeon: Lin Landsman, MD;  Location: [place];  Service: Gastroenterology;  Laterality: N/A;   CYSTOSCOPY WITH STENT PLACEMENT Right 09/07/2021   Procedure: CYSTOSCOPY WITH STENT PLACEMENT; WITH RETROGRADE PYELOGRAM;  Surgeon: Hollice Espy, MD;  Location: [facility];  Service: Urology;  Laterality: Right;   CYSTOSCOPY/RETROGRADE/URETEROSCOPY/STONE EXTRACTION WITH BASKET  09/21/2021   Procedure: CYSTOSCOPY/RETROGRADE/URETEROSCOPY/STONE EXTRACTION WITH BASKET;  Surgeon: Hollice Espy, MD;  Location: [facility];  Service: Urology;;   ESOPHAGOGASTRODUODENOSCOPY (EGD) WITH PROPOFOL N/A 10/07/2021   Procedure: ESOPHAGOGASTRODUODENOSCOPY (EGD) WITH  PROPOFOL;  Surgeon: Lin Landsman, MD;  Location: [facility];  Service: Gastroenterology;  Laterality: N/A;   SUPRACERVICAL ABDOMINAL HYSTERECTOMY  2006   has ovaries. Had heavy periods and was anemic. HAS cervix   TONSILLECTOMY  1966   Family History  Problem Relation Age of Onset   Congestive Heart Failure Mother    CVA Mother 74   Colon cancer Father    Esophageal cancer Maternal Uncle    Thyroid cancer Neg Hx     Allergies: Pneumovax 23 [pneumococcal vac polyvalent] and Prevnar 13 [pneumococcal 13-val conj vacc] Current Outpatient Medications on File Prior to Visit  Medication Sig Dispense Refill   Accu-Chek FastClix Lancets MISC Used to check blood sugars twice a day. 100 each 3   aspirin EC 81 MG tablet Take 81 mg by mouth daily.     atorvastatin (LIPITOR) 40 MG tablet TAKE 1 TABLET BY MOUTH EVERY DAY 90 tablet 3   Blood Glucose Monitoring Suppl (ACCU-CHEK GUIDE ME) w/Device KIT USE AS DIRECTED 1 kit 0   Calcium-Vitamin D-Vitamin K (VIACTIV CALCIUM PLUS D PO) Take 1 tablet by mouth daily.     Cholecalciferol (VITAMIN D3 PO) Take 400 mg by mouth daily.     citalopram (CELEXA) 20 MG tablet TAKE 1 TABLET BY MOUTH EVERY DAY 90 tablet 2   famotidine (PEPCID) 20 MG tablet Take 20 mg by mouth at bedtime.     ferrous sulfate 325 (65 FE) MG EC tablet Take 325 mg by mouth 3 (  three) times daily with meals.     glucose blood (ACCU-CHEK AVIVA PLUS) test strip Used to check blood sugars twice a day. 100 each 5   Insulin Pen Needle (PEN NEEDLES) 32G X 4 MM MISC Used to give Victoza injections. 100 each 1   Lancets (ONETOUCH ULTRASOFT) lancets Used to check blood sugars once a day. 100 each 4   lisinopril (ZESTRIL) 5 MG tablet TAKE 1 TABLET (5 MG TOTAL) BY MOUTH DAILY. 90 tablet 1   meloxicam (MOBIC) 7.5 MG tablet Take 1 tablet (7.5 mg total) by mouth daily as needed for pain. 30 tablet 1   metFORMIN (GLUCOPHAGE) 1000 MG tablet TAKE 1 TABLET (1,000 MG TOTAL) BY MOUTH TWICE A DAY WITH  FOOD 180 tablet 1   Multiple Vitamins-Minerals (CENTRUM SILVER ADULT 50+ PO) Take 1 tablet by mouth daily.     ondansetron (ZOFRAN ODT) 4 MG disintegrating tablet Take 1 tablet (4 mg total) by mouth every 8 (eight) hours as needed. 30 tablet 1   pantoprazole (PROTONIX) 20 MG tablet TAKE 1 TABLET (20 MG TOTAL) BY MOUTH 2 (TWO) TIMES DAILY BEFORE A MEAL. 180 tablet 1   Semaglutide, 1 MG/DOSE, (OZEMPIC, 1 MG/DOSE,) 4 MG/3ML SOPN INJECT 1MG INTO THE SKIN ONCE A WEEK 9 mL 1   No current facility-administered medications on file prior to visit.    Social History   Tobacco Use   Smoking status: Never   Smokeless tobacco: Never  Vaping Use   Vaping Use: Never used  Substance Use Topics   Alcohol use: Not Currently   Drug use: Never    Review of Systems  Constitutional:  Negative for chills and fever.  Respiratory:  Negative for cough.   Cardiovascular:  Negative for chest pain and palpitations.  Gastrointestinal:  Negative for nausea and vomiting.  Musculoskeletal:  Positive for arthralgias. Negative for back pain and joint swelling.  Neurological:  Negative for numbness.      Objective:    BP 128/76   Pulse 80   Temp 97.7 F (36.5 C) (Oral)   Wt 173 lb (78.5 kg)   SpO2 98%   BMI 24.82 kg/m  BP Readings from Last 3 Encounters:  10/29/22 128/76  07/28/22 126/76  05/28/22 138/82   Wt Readings from Last 3 Encounters:  10/29/22 173 lb (78.5 kg)  07/28/22 174 lb (78.9 kg)  05/28/22 177 lb 12.8 oz (80.6 kg)    Physical Exam Vitals reviewed.  Constitutional:      Appearance: She is well-developed.  Eyes:     Conjunctiva/sclera: Conjunctivae normal.  Cardiovascular:     Rate and Rhythm: Normal rate and regular rhythm.     Pulses: Normal pulses.     Heart sounds: Normal heart sounds.  Pulmonary:     Effort: Pulmonary effort is normal.     Breath sounds: Normal breath sounds. No wheezing, rhonchi or rales.  Musculoskeletal:     Right shoulder: No swelling, tenderness or  bony tenderness. Decreased range of motion.     Right upper arm: No swelling, edema, tenderness or bony tenderness.     Cervical back: Normal. No bony tenderness. No pain with movement.     Comments: Right Shoulder:   No asymmetry of shoulders when comparing right and left.No pain with palpation over glenohumeral joint lines, [place] joint, AC joint, or bicipital groove. Pain with internal and external rotation. Pain with resisted lateral extension .   Negative active painful arc sign. Negative passive arc ( Neer's). Negative  drop arm.   No pain, swelling, or ecchymosis noted over long head of biceps.  No bicep pain with resisted flexion and extension.  Negative speeds test. Negative Popeye's sign.   Strength and sensation normal BUE's.   Skin:    General: Skin is warm and dry.  Neurological:     Mental Status: She is alert.  Psychiatric:        Speech: Speech normal.        Behavior: Behavior normal.        Thought Content: Thought content normal.        Assessment & Plan:   Problem List Items Addressed This Visit       Cardiovascular and Mediastinum   HTN (hypertension)    Chronic, stable.  Continue lisinopril 5 mg QD        Endocrine   DM (diabetes mellitus) ([place])    Lab Results  Component Value Date   HGBA1C 6.1 (A) 10/29/2022  Congratulated patient on improvement.  Continue metformin 1000 mg twice daily, Ozempic 1 mg        Other   Arm pain - Primary    Duration 8 months without improvement with conservative measures.  Jointly agreed referral to sports medicine.  Pending baseline cervical x-ray, right shoulder x-ray.      Relevant Orders   DG Cervical Spine Complete   DG Shoulder Right   Ambulatory referral to Sports Medicine   Other Visit Diagnoses     Elevated glucose       Relevant Orders   POCT HgB A1C (Completed)        I have discontinued Lakaisha Panda. Sekula's budesonide-formoterol and predniSONE. I am also having her maintain her Multiple  Vitamins-Minerals (CENTRUM SILVER ADULT 50+ PO), aspirin EC, onetouch ultrasoft, Pen Needles, Calcium-Vitamin D-Vitamin K (VIACTIV CALCIUM PLUS D PO), Accu-Chek Aviva Plus, Accu-Chek FastClix Lancets, Accu-Chek Guide Me, famotidine, atorvastatin, citalopram, ferrous sulfate, ondansetron, Cholecalciferol (VITAMIN D3 PO), Ozempic (1 MG/DOSE), lisinopril, meloxicam, pantoprazole, and metFORMIN.   No orders of the defined types were placed in this encounter.   Return precautions given.   Risks, benefits, and alternatives of the medications and treatment plan prescribed today were discussed, and patient expressed understanding.   Education regarding symptom management and diagnosis given to patient on AVS.  Continue to follow with Burnard Hawthorne, FNP for routine health maintenance.   [name] and I agreed with plan.   Mable Paris, FNP

## 2022-11-02 NOTE — Progress Notes (Addendum)
Benito Mccreedy D.[facility] [place] Phone: [phone]   Assessment and Plan:     1. Arm pain, right 2. Chronic right shoulder pain 3. Tendinitis of right rotator cuff  -Chronic with exacerbation, initial sports medicine visit - Consistent with rotator cuff tendinopathy versus subacromial bursitis, likely from strain during an upper extremity workout 8 months ago that has not fully improved - Patient elected for subacromial CSI.  Tolerated well per note below - Use Tylenol as needed for pain relief - Start HEP and physical therapy for rotator cuff and shoulder - Reviewed patient's C-spine and right shoulder x-ray with her in office.  My interpretation: No acute fracture, dislocations, or vertebral collapse.  AC joint arthritis.  Anterior vertebral spurring at C4, 5, 6, 7  Procedure: Subacromial Injection Side: right  Risks explained and consent was given verbally. The site was cleaned with alcohol prep. A steroid injection was performed from posterior approach using 40m of 1% lidocaine without epinephrine and 159mof kenalog 4073ml. This was well tolerated and resulted in symptomatic relief.  Needle was removed, hemostasis achieved, and post injection instructions were explained.   Pt was advised to call or return to clinic if these symptoms worsen or fail to improve as anticipated.   Pertinent previous records reviewed include  Right shoulder x-ray 10/29/2022, C-spine x-ray 10/29/2022, family medicine note 10/29/2022   Follow Up: 4 weeks for reevaluation.  Could consider ultrasound versus advanced imaging if no improvement or worsening of symptoms   Subjective:   I, Moenique Parris, am serving as a scrEducation administratorr Doctor BenGlennon Machief Complaint: triceps pain   HPI:   11/03/2022 Patient is a 66 24ar old female complaining of triceps  pain. Patient states back in aril she did an arm challenge , sleeping his hard to  do , pain with all ROM , played pickle ball this summer, pain in the morning and at night , naproxen and that helped a little, has meloxicam , tried heat and ice , radiates down to the hand    Relevant Historical Information: Hypertension, GERD, DM type II  Additional pertinent review of systems negative.   Current Outpatient Medications:    Accu-Chek FastClix Lancets MISC, Used to check blood sugars twice a day., Disp: 100 each, Rfl: 3   aspirin EC 81 MG tablet, Take 81 mg by mouth daily., Disp: , Rfl:    atorvastatin (LIPITOR) 40 MG tablet, TAKE 1 TABLET BY MOUTH EVERY DAY, Disp: 90 tablet, Rfl: 3   Blood Glucose Monitoring Suppl (ACCU-CHEK GUIDE ME) w/Device KIT, USE AS DIRECTED, Disp: 1 kit, Rfl: 0   Calcium-Vitamin D-Vitamin K (VIACTIV CALCIUM PLUS D PO), Take 1 tablet by mouth daily., Disp: , Rfl:    Cholecalciferol (VITAMIN D3 PO), Take 400 mg by mouth daily., Disp: , Rfl:    citalopram (CELEXA) 20 MG tablet, TAKE 1 TABLET BY MOUTH EVERY DAY, Disp: 90 tablet, Rfl: 2   famotidine (PEPCID) 20 MG tablet, Take 20 mg by mouth at bedtime., Disp: , Rfl:    ferrous sulfate 325 (65 FE) MG EC tablet, Take 325 mg by mouth 3 (three) times daily with meals., Disp: , Rfl:    glucose blood (ACCU-CHEK AVIVA PLUS) test strip, Used to check blood sugars twice a day., Disp: 100 each, Rfl: 5   Insulin Pen Needle (PEN NEEDLES) 32G X 4 MM MISC, Used to give Victoza injections., Disp: 100 each, Rfl: 1  Lancets (ONETOUCH ULTRASOFT) lancets, Used to check blood sugars once a day., Disp: 100 each, Rfl: 4   lisinopril (ZESTRIL) 5 MG tablet, TAKE 1 TABLET (5 MG TOTAL) BY MOUTH DAILY., Disp: 90 tablet, Rfl: 1   meloxicam (MOBIC) 7.5 MG tablet, Take 1 tablet (7.5 mg total) by mouth daily as needed for pain., Disp: 30 tablet, Rfl: 1   metFORMIN (GLUCOPHAGE) 1000 MG tablet, TAKE 1 TABLET (1,000 MG TOTAL) BY MOUTH TWICE A DAY WITH FOOD, Disp: 180 tablet, Rfl: 1   Multiple Vitamins-Minerals (CENTRUM SILVER ADULT 50+  PO), Take 1 tablet by mouth daily., Disp: , Rfl:    ondansetron (ZOFRAN ODT) 4 MG disintegrating tablet, Take 1 tablet (4 mg total) by mouth every 8 (eight) hours as needed., Disp: 30 tablet, Rfl: 1   pantoprazole (PROTONIX) 20 MG tablet, TAKE 1 TABLET (20 MG TOTAL) BY MOUTH 2 (TWO) TIMES DAILY BEFORE A MEAL., Disp: 180 tablet, Rfl: 1   Semaglutide, 1 MG/DOSE, (OZEMPIC, 1 MG/DOSE,) 4 MG/3ML SOPN, INJECT 1MG INTO THE SKIN ONCE A WEEK, Disp: 9 mL, Rfl: 1   Objective:     Vitals:   11/03/22 1248  BP: 132/80  Pulse: 66  SpO2: 99%  Weight: 176 lb (79.8 kg)  Height: _0  (1.778 m)      Body mass index is 25.25 kg/m.    Physical Exam:    Gen: Appears well, nad, nontoxic and pleasant Neuro:sensation intact, strength is 5/5 with df/pf/inv/ev, muscle tone wnl Skin: no suspicious lesion or defmority Psych: A&O, appropriate mood and affect  Right shoulder: no deformity, swelling or muscle wasting No scapular winging FF 160 with painful arc, abd 160 with painful arc, int 20 with painful arc, ext 80 TTP deltoid NTTP over the [place], clavicle, ac, coracoid, biceps groove, humerus,  , trapezius, cervical spine Positive neer, hawkings, empty can, obriens, crossarm, Negative subscap liftoff, speeds Neg ant drawer, sulcus sign, apprehension No pain with resisted elbow flexion or extension Negative Spurling's test bilat FROM of neck    Electronically signed by:  Benito Mccreedy D.[facility] 1:13 PM 11/03/22

## 2022-11-03 ENCOUNTER — Ambulatory Visit (INDEPENDENT_AMBULATORY_CARE_PROVIDER_SITE_OTHER): Payer: Medicare HMO | Admitting: Sports Medicine

## 2022-11-03 VITALS — BP 132/80 | HR 66 | Ht 70.0 in | Wt 176.0 lb

## 2022-11-03 DIAGNOSIS — G8929 Other chronic pain: Secondary | ICD-10-CM | POA: Diagnosis not present

## 2022-11-03 DIAGNOSIS — M25511 Pain in right shoulder: Secondary | ICD-10-CM | POA: Diagnosis not present

## 2022-11-03 DIAGNOSIS — M79601 Pain in right arm: Secondary | ICD-10-CM

## 2022-11-03 DIAGNOSIS — M7581 Other shoulder lesions, right shoulder: Secondary | ICD-10-CM | POA: Diagnosis not present

## 2022-11-03 NOTE — Patient Instructions (Addendum)
Good to see you  Referral to PT  Shoulder HEP  4 week follow up

## 2022-11-04 ENCOUNTER — Other Ambulatory Visit: Payer: Self-pay | Admitting: Family

## 2022-11-10 ENCOUNTER — Ambulatory Visit (INDEPENDENT_AMBULATORY_CARE_PROVIDER_SITE_OTHER): Payer: Medicare HMO

## 2022-11-10 DIAGNOSIS — E538 Deficiency of other specified B group vitamins: Secondary | ICD-10-CM | POA: Diagnosis not present

## 2022-11-10 MED ORDER — CYANOCOBALAMIN 1000 MCG/ML IJ SOLN
1000.0000 ug | Freq: Once | INTRAMUSCULAR | Status: AC
  Administered 2022-11-10: 1000 ug via INTRAMUSCULAR

## 2022-11-10 NOTE — Progress Notes (Signed)
Pt presented for their vitamin B12 injection. Pt was identified through two identifiers. Pt tolerated shot well in their left  deltoid.  

## 2022-11-12 ENCOUNTER — Other Ambulatory Visit: Payer: Self-pay | Admitting: Family

## 2022-11-12 DIAGNOSIS — Z0000 Encounter for general adult medical examination without abnormal findings: Secondary | ICD-10-CM

## 2022-11-12 NOTE — Telephone Encounter (Signed)
Patient called and has been waiting for her citalopram (CELEXA) 20 MG tablet to be refilled. She is out of her medication.

## 2022-11-25 ENCOUNTER — Ambulatory Visit
Admission: EM | Admit: 2022-11-25 | Discharge: 2022-11-25 | Disposition: A | Discharge status: Other Acute Inpatient Hospital | Payer: Medicare HMO

## 2022-11-25 ENCOUNTER — Emergency Department: Payer: Medicare HMO

## 2022-11-25 ENCOUNTER — Other Ambulatory Visit: Payer: Self-pay

## 2022-11-25 ENCOUNTER — Emergency Department
Admission: EM | Admit: 2022-11-25 | Discharge: 2022-11-25 | Disposition: A | Discharge status: Home / Self Care | Payer: Medicare HMO | Attending: Emergency Medicine | Admitting: Emergency Medicine

## 2022-11-25 ENCOUNTER — Telehealth: Payer: Self-pay | Admitting: Family

## 2022-11-25 DIAGNOSIS — M79604 Pain in right leg: Secondary | ICD-10-CM | POA: Diagnosis present

## 2022-11-25 DIAGNOSIS — X58XXXA Exposure to other specified factors, initial encounter: Secondary | ICD-10-CM | POA: Insufficient documentation

## 2022-11-25 DIAGNOSIS — T148XXA Other injury of unspecified body region, initial encounter: Secondary | ICD-10-CM

## 2022-11-25 DIAGNOSIS — S86911A Strain of unspecified muscle(s) and tendon(s) at lower leg level, right leg, initial encounter: Secondary | ICD-10-CM | POA: Insufficient documentation

## 2022-11-25 NOTE — Telephone Encounter (Signed)
Spoke to pt and she stated that they did [place] at ED  and she does not have a blood clot. They stated it may be a strain of some sort. Pt scheduled appt for F/UP in office on 12/06/22

## 2022-11-25 NOTE — Telephone Encounter (Signed)
Spoke to pt and she is in the ED right now. I will check back on the patient later today to see what the outcome is.

## 2022-11-25 NOTE — ED Notes (Signed)
D/C and reasons to return discussed with pt, pt verbalized understanding. NAD noted. Pt ambulatory with steady gait on D/C.

## 2022-11-25 NOTE — ED Provider Notes (Signed)
   [facility] Provider Note    Event Date/Time   First MD Initiated Contact with Patient 11/25/22 1103     (approximate)   History   Leg Pain   HPI  Danielle Dickerson is a 66 y.o. female who presents with complaints of calf pain, sent from urgent care for evaluation of possible DVT.  Patient reports symptoms for about a week, no significant swelling.     Physical Exam   Triage Vital Signs: ED Triage Vitals  Enc Vitals Group     BP 11/25/22 0946 136/82     Pulse Rate 11/25/22 0946 71     Resp 11/25/22 0946 20     Temp 11/25/22 0946 98.6 F (37 C)     Temp Source 11/25/22 0946 Oral     SpO2 11/25/22 0946 99 %     Weight 11/25/22 0945 78.9 kg (174 lb)     Height 11/25/22 0945 1.829 m (6')     Head Circumference --      Peak Flow --      Pain Score 11/25/22 0945 6     Pain Loc --      Pain Edu? --      Excl. in GC? --     Most recent vital signs: Vitals:   11/25/22 0946  BP: 136/82  Pulse: 71  Resp: 20  Temp: 98.6 F (37 C)  SpO2: 99%     General: Awake, no distress.  CV:  Good peripheral perfusion.  Resp:  Normal effort.  Abd:  No distention.  Other:  Mild tenderness palpation along the superior border of the right calf, no significant swelling, warm and well-perfused, no rash   ED Results / Procedures / Treatments   Labs (all labs ordered are listed, but only abnormal results are displayed) Labs Reviewed - No data to display   EKG     RADIOLOGY Ultrasound viewed interpreted by me, no clot, confirm a radiology    PROCEDURES:  Critical Care performed:   Procedures   MEDICATIONS ORDERED IN ED: Medications - No data to display   IMPRESSION / MDM / ASSESSMENT AND PLAN / ED COURSE  I reviewed the triage vital signs and the nursing notes. Patient's presentation is most consistent with acute presentation with potential threat to life or bodily function.  Patient presents with right calf pain as above, differential  includes DVT, musculoskeletal pain, Baker's cyst, cellulitis  Exam is overall quite reassuring, not consistent with cellulitis, ultrasound negative for DVT, likely musculoskeletal injury, recommend supportive care, outpatient follow-up with Ortho as needed        FINAL CLINICAL IMPRESSION(S) / ED DIAGNOSES   Final diagnoses:  Right leg pain  Muscle strain     Rx / DC Orders   ED Discharge Orders     None        Note:  This document was prepared using Dragon voice recognition software and may include unintentional dictation errors.   Jene Every, MD 11/25/22 1550

## 2022-11-25 NOTE — ED Triage Notes (Signed)
Pt reports pain to right leg for the past week. Pt reports pain is from her knee down. Denies injuries. Pt reports went to [facility] and they brought her over here to rule out a DVT

## 2022-11-25 NOTE — Telephone Encounter (Signed)
Pt called stating her right leg hurts and she can hardly walk on it. Pt is worried about blood clots. Sent to access nurse

## 2022-11-25 NOTE — ED Notes (Signed)
Pt in with co right leg pains states pain to posterior lower leg. Denies any injury, no hx of the same. Pt concerned about DVT's no hx of clots.

## 2022-11-30 ENCOUNTER — Telehealth: Payer: Medicare HMO | Admitting: Nurse Practitioner

## 2022-11-30 DIAGNOSIS — J22 Unspecified acute lower respiratory infection: Secondary | ICD-10-CM

## 2022-11-30 MED ORDER — AZITHROMYCIN 250 MG PO TABS: ORAL_TABLET | ORAL | 0 refills | Status: AC

## 2022-11-30 MED ORDER — BENZONATATE 100 MG PO CAPS: 100.0000 mg | ORAL_CAPSULE | Freq: Three times a day (TID) | ORAL | 0 refills | Status: DC | PRN

## 2022-11-30 NOTE — Progress Notes (Signed)
Virtual Visit Consent   [name], you are scheduled for a virtual visit with a [facility] provider today. Just as with appointments in the office, your consent must be obtained to participate. Your consent will be active for this visit and any virtual visit you may have with one of our providers in the next 365 days. If you have a MyChart account, a copy of this consent can be sent to you electronically.  As this is a virtual visit, video technology does not allow for your provider to perform a traditional examination. This may limit your provider's ability to fully assess your condition. If your provider identifies any concerns that need to be evaluated in person or the need to arrange testing (such as labs, EKG, etc.), we will make arrangements to do so. Although advances in technology are sophisticated, we cannot ensure that it will always work on either your end or our end. If the connection with a video visit is poor, the visit may have to be switched to a telephone visit. With either a video or telephone visit, we are not always able to ensure that we have a secure connection.  By engaging in this virtual visit, you consent to the provision of healthcare and authorize for your insurance to be billed (if applicable) for the services provided during this visit. Depending on your insurance coverage, you may receive a charge related to this service.  I need to obtain your verbal consent now. Are you willing to proceed with your visit today? Danielle Dickerson has provided verbal consent on 11/30/2022 for a virtual visit (video or telephone). Apolonio Schneiders, FNP  Date: 11/30/2022 5:43 PM  Virtual Visit via Video Note   I, Apolonio Schneiders, connected with  Danielle Dickerson  ([id number], [birth year]) on 11/30/22 at  5:45 PM EST by a video-enabled telemedicine application and verified that I am speaking with the correct person using two identifiers.  Location: Patient: Virtual Visit Location Patient:  Home Provider: Virtual Visit Location Provider: Home Office   I discussed the limitations of evaluation and management by telemedicine and the availability of in person appointments. The patient expressed understanding and agreed to proceed.    History of Present Illness: Danielle Dickerson is a 67 y.o. who identifies as a female who was assigned female at birth, and is being seen today for cough. Her cough has been dry, she has nasal congestion, fatigue.   She did take a home COVID test that was negative.   She was exposed to some children that were sick over the past week without known diagnosis  She did have pneumonia last year   Denies a history of asthma or COPD she has been prescribed an inhaler the past   Denies fever   She has been using Dayquil and Nyquil for relief   Problems:  Patient Active Problem List   Diagnosis Date Noted   Skin lesion 07/28/2022   Arm pain 07/28/2022   LLL pneumonia 05/10/2022   Medicare annual wellness visit, initial 04/04/2022   HTN (hypertension) 01/22/2022   Fatigue 09/30/2021   Right ureteral stone 09/07/2021   Bilious emesis 08/18/2021   Renal stone 08/18/2021   Pernicious anemia 07/03/2021   Initial Medicare annual wellness visit    Atypical squamous cell changes of undetermined significance (ASCUS) on cervical cytology with negative high risk human papilloma virus (HPV) test result 05/08/2020   DM (diabetes mellitus) ([place]) 01/30/2020   HLD (hyperlipidemia) 01/30/2020   GERD (gastroesophageal  reflux disease) 01/30/2020    Allergies:  Allergies  Allergen Reactions   Pneumovax 23 [Pneumococcal Vac Polyvalent] Swelling    Causes arm swelling, redness.   Prevnar 13 [Pneumococcal 13-Val Conj Vacc] Swelling   Medications:  Current Outpatient Medications:    Accu-Chek FastClix Lancets MISC, Used to check blood sugars twice a day., Disp: 100 each, Rfl: 3   aspirin EC 81 MG tablet, Take 81 mg by mouth daily., Disp: , Rfl:    atorvastatin  (LIPITOR) 40 MG tablet, TAKE 1 TABLET BY MOUTH EVERY DAY, Disp: 90 tablet, Rfl: 1   Blood Glucose Monitoring Suppl (ACCU-CHEK GUIDE ME) w/Device KIT, USE AS DIRECTED, Disp: 1 kit, Rfl: 0   Calcium-Vitamin D-Vitamin K (VIACTIV CALCIUM PLUS D PO), Take 1 tablet by mouth daily., Disp: , Rfl:    Cholecalciferol (VITAMIN D3 PO), Take 400 mg by mouth daily., Disp: , Rfl:    citalopram (CELEXA) 20 MG tablet, TAKE 1 TABLET BY MOUTH EVERY DAY, Disp: 90 tablet, Rfl: 2   famotidine (PEPCID) 20 MG tablet, Take 20 mg by mouth at bedtime., Disp: , Rfl:    ferrous sulfate 325 (65 FE) MG EC tablet, Take 325 mg by mouth 3 (three) times daily with meals., Disp: , Rfl:    glucose blood (ACCU-CHEK AVIVA PLUS) test strip, Used to check blood sugars twice a day., Disp: 100 each, Rfl: 5   Insulin Pen Needle (PEN NEEDLES) 32G X 4 MM MISC, Used to give Victoza injections., Disp: 100 each, Rfl: 1   Lancets (ONETOUCH ULTRASOFT) lancets, Used to check blood sugars once a day., Disp: 100 each, Rfl: 4   lisinopril (ZESTRIL) 5 MG tablet, TAKE 1 TABLET (5 MG TOTAL) BY MOUTH DAILY., Disp: 90 tablet, Rfl: 1   meloxicam (MOBIC) 7.5 MG tablet, Take 1 tablet (7.5 mg total) by mouth daily as needed for pain., Disp: 30 tablet, Rfl: 1   metFORMIN (GLUCOPHAGE) 1000 MG tablet, TAKE 1 TABLET (1,000 MG TOTAL) BY MOUTH TWICE A DAY WITH FOOD, Disp: 180 tablet, Rfl: 1   Multiple Vitamins-Minerals (CENTRUM SILVER ADULT 50+ PO), Take 1 tablet by mouth daily., Disp: , Rfl:    ondansetron (ZOFRAN ODT) 4 MG disintegrating tablet, Take 1 tablet (4 mg total) by mouth every 8 (eight) hours as needed., Disp: 30 tablet, Rfl: 1   pantoprazole (PROTONIX) 20 MG tablet, TAKE 1 TABLET (20 MG TOTAL) BY MOUTH 2 (TWO) TIMES DAILY BEFORE A MEAL., Disp: 180 tablet, Rfl: 1   Semaglutide, 1 MG/DOSE, (OZEMPIC, 1 MG/DOSE,) 4 MG/3ML SOPN, INJECT 1MG INTO THE SKIN ONCE A WEEK, Disp: 9 mL, Rfl: 1  Observations/Objective: Patient is well-developed, well-nourished in no  acute distress.  Resting comfortably  at home.  Head is normocephalic, atraumatic.  No labored breathing.  Speech is clear and coherent with logical content.  Patient is alert and oriented at baseline.    Assessment and Plan: 1. Lower respiratory infection  - azithromycin (ZITHROMAX) 250 MG tablet; Take 2 tablets on day 1, then 1 tablet daily on days 2 through 5  Dispense: 6 tablet; Refill: 0 - benzonatate (TESSALON) 100 MG capsule; Take 1 capsule (100 mg total) by mouth 3 (three) times daily as needed.  Dispense: 30 capsule; Refill: 0    Follow Up Instructions: I discussed the assessment and treatment plan with the patient. The patient was provided an opportunity to ask questions and all were answered. The patient agreed with the plan and demonstrated an understanding of the instructions.  A  copy of instructions were sent to the patient via MyChart unless otherwise noted below.    The patient was advised to call back or seek an in-person evaluation if the symptoms worsen or if the condition fails to improve as anticipated.  Time:  I spent 20 minutes with the patient via telehealth technology discussing the above problems/concerns.    Apolonio Schneiders, FNP

## 2022-11-30 NOTE — Progress Notes (Unsigned)
Benito Mccreedy D.[place] Danielle Dickerson Phone: [phone]   Assessment and Plan:     There are no diagnoses linked to this encounter.  ***   Pertinent previous records reviewed include ***   Follow Up: ***     Subjective:   I, Danielle Dickerson, am serving as a Education administrator for Doctor Glennon Mac   Chief Complaint: triceps pain    HPI:    11/03/2022 Patient is a 67 year old female complaining of triceps  pain. Patient states back in [place] she did an arm challenge , sleeping his hard to do , pain with all ROM , played pickle ball this summer, pain in the morning and at night , naproxen and that helped a little, has meloxicam , tried heat and ice , radiates down to the hand   12/01/2022 Patient states     Relevant Historical Information: Hypertension, GERD, DM type II  Additional pertinent review of systems negative.   Current Outpatient Medications:    Accu-Chek FastClix Lancets MISC, Used to check blood sugars twice a day., Disp: 100 each, Rfl: 3   aspirin EC 81 MG tablet, Take 81 mg by mouth daily., Disp: , Rfl:    atorvastatin (LIPITOR) 40 MG tablet, TAKE 1 TABLET BY MOUTH EVERY DAY, Disp: 90 tablet, Rfl: 1   Blood Glucose Monitoring Suppl (ACCU-CHEK GUIDE ME) w/Device KIT, USE AS DIRECTED, Disp: 1 kit, Rfl: 0   Calcium-Vitamin D-Vitamin K (VIACTIV CALCIUM PLUS D PO), Take 1 tablet by mouth daily., Disp: , Rfl:    Cholecalciferol (VITAMIN D3 PO), Take 400 mg by mouth daily., Disp: , Rfl:    citalopram (CELEXA) 20 MG tablet, TAKE 1 TABLET BY MOUTH EVERY DAY, Disp: 90 tablet, Rfl: 2   famotidine (PEPCID) 20 MG tablet, Take 20 mg by mouth at bedtime., Disp: , Rfl:    ferrous sulfate 325 (65 FE) MG EC tablet, Take 325 mg by mouth 3 (three) times daily with meals., Disp: , Rfl:    glucose blood (ACCU-CHEK AVIVA PLUS) test strip, Used to check blood sugars twice a day., Disp: 100 each, Rfl: 5   Insulin Pen Needle (PEN  NEEDLES) 32G X 4 MM MISC, Used to give Victoza injections., Disp: 100 each, Rfl: 1   Lancets (ONETOUCH ULTRASOFT) lancets, Used to check blood sugars once a day., Disp: 100 each, Rfl: 4   lisinopril (ZESTRIL) 5 MG tablet, TAKE 1 TABLET (5 MG TOTAL) BY MOUTH DAILY., Disp: 90 tablet, Rfl: 1   meloxicam (MOBIC) 7.5 MG tablet, Take 1 tablet (7.5 mg total) by mouth daily as needed for pain., Disp: 30 tablet, Rfl: 1   metFORMIN (GLUCOPHAGE) 1000 MG tablet, TAKE 1 TABLET (1,000 MG TOTAL) BY MOUTH TWICE A DAY WITH FOOD, Disp: 180 tablet, Rfl: 1   Multiple Vitamins-Minerals (CENTRUM SILVER ADULT 50+ PO), Take 1 tablet by mouth daily., Disp: , Rfl:    ondansetron (ZOFRAN ODT) 4 MG disintegrating tablet, Take 1 tablet (4 mg total) by mouth every 8 (eight) hours as needed., Disp: 30 tablet, Rfl: 1   pantoprazole (PROTONIX) 20 MG tablet, TAKE 1 TABLET (20 MG TOTAL) BY MOUTH 2 (TWO) TIMES DAILY BEFORE A MEAL., Disp: 180 tablet, Rfl: 1   Semaglutide, 1 MG/DOSE, (OZEMPIC, 1 MG/DOSE,) 4 MG/3ML SOPN, INJECT 1MG INTO THE SKIN ONCE A WEEK, Disp: 9 mL, Rfl: 1   Objective:     There were no vitals filed for this visit.  There is no height or weight on file to calculate BMI.    Physical Exam:    ***   Electronically signed by:  Benito Mccreedy D.[facility] 1:41 PM 11/30/22

## 2022-12-01 ENCOUNTER — Ambulatory Visit (INDEPENDENT_AMBULATORY_CARE_PROVIDER_SITE_OTHER): Payer: Medicare HMO | Admitting: Sports Medicine

## 2022-12-01 VITALS — BP 138/86 | HR 78 | Ht 72.0 in | Wt 174.0 lb

## 2022-12-01 DIAGNOSIS — G8929 Other chronic pain: Secondary | ICD-10-CM | POA: Diagnosis not present

## 2022-12-01 DIAGNOSIS — M79601 Pain in right arm: Secondary | ICD-10-CM

## 2022-12-01 DIAGNOSIS — M7581 Other shoulder lesions, right shoulder: Secondary | ICD-10-CM | POA: Diagnosis not present

## 2022-12-01 DIAGNOSIS — M25511 Pain in right shoulder: Secondary | ICD-10-CM

## 2022-12-06 ENCOUNTER — Encounter: Payer: Self-pay | Admitting: Family

## 2022-12-06 ENCOUNTER — Ambulatory Visit (INDEPENDENT_AMBULATORY_CARE_PROVIDER_SITE_OTHER): Payer: Medicare HMO | Admitting: Family

## 2022-12-06 VITALS — BP 120/78 | HR 75 | Temp 98.2°F | Ht 72.0 in | Wt 175.0 lb

## 2022-12-06 DIAGNOSIS — M25561 Pain in right knee: Secondary | ICD-10-CM | POA: Diagnosis not present

## 2022-12-06 NOTE — Progress Notes (Unsigned)
Assessment & Plan:  There are no diagnoses linked to this encounter.   Return precautions given.   Risks, benefits, and alternatives of the medications and treatment plan prescribed today were discussed, and patient expressed understanding.   Education regarding symptom management and diagnosis given to patient on AVS either electronically or printed.  Return in about 3 months (around 03/07/2023).  Mable Paris, FNP  Subjective:    Patient ID: Danielle Dickerson, female    DOB: 1956-10-18, 67 y.o.   MRN: [id number]  CC: Danielle Dickerson is a 67 y.o. female who presents today for follow up.   HPI: Left knee pain has improved.  She may feel slight discomfort with walking.  No injury.  She continues to ride her [employer] bike.  No leg swelling    ED follow up 11/25/22 for calf pain, concern of possible DVT Right leg [place] negative DVT Consult Dr. 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  Improvement after subacromial CSI 11/03/2022.  Allergies: Pneumovax 23 [pneumococcal vac polyvalent] and Prevnar 13 [pneumococcal 13-val conj vacc] Current Outpatient Medications on File Prior to Visit  Medication Sig Dispense Refill   Accu-Chek FastClix Lancets MISC Used to check blood sugars twice a day. 100 each 3   aspirin EC 81 MG tablet Take 81 mg by mouth daily.     atorvastatin (LIPITOR) 40 MG tablet TAKE 1 TABLET BY MOUTH EVERY DAY 90 tablet 1   Blood Glucose Monitoring Suppl (ACCU-CHEK GUIDE ME) w/Device KIT USE AS DIRECTED 1 kit 0   Calcium-Vitamin D-Vitamin K (VIACTIV CALCIUM PLUS D PO) Take 1 tablet by mouth daily.     Cholecalciferol (VITAMIN D3 PO) Take 400 mg by mouth daily.     citalopram (CELEXA) 20 MG tablet TAKE 1 TABLET BY MOUTH EVERY DAY 90 tablet 2   famotidine (PEPCID) 20 MG tablet Take 20 mg by mouth at bedtime.     ferrous sulfate 325 (65 FE) MG EC tablet Take 325 mg by mouth 3 (three) times daily with meals.     glucose blood (ACCU-CHEK  AVIVA PLUS) test strip Used to check blood sugars twice a day. 100 each 5   Insulin Pen Needle (PEN NEEDLES) 32G X 4 MM MISC Used to give Victoza injections. 100 each 1   Lancets (ONETOUCH ULTRASOFT) lancets Used to check blood sugars once a day. 100 each 4   lisinopril (ZESTRIL) 5 MG tablet TAKE 1 TABLET (5 MG TOTAL) BY MOUTH DAILY. 90 tablet 1   metFORMIN (GLUCOPHAGE) 1000 MG tablet TAKE 1 TABLET (1,000 MG TOTAL) BY MOUTH TWICE A DAY WITH FOOD 180 tablet 1   Multiple Vitamins-Minerals (CENTRUM SILVER ADULT 50+ PO) Take 1 tablet by mouth daily.     ondansetron (ZOFRAN ODT) 4 MG disintegrating tablet Take 1 tablet (4 mg total) by mouth every 8 (eight) hours as needed. 30 tablet 1   pantoprazole (PROTONIX) 20 MG tablet TAKE 1 TABLET (20 MG TOTAL) BY MOUTH 2 (TWO) TIMES DAILY BEFORE A MEAL. 180 tablet 1   Semaglutide, 1 MG/DOSE, (OZEMPIC, 1 MG/DOSE,) 4 MG/3ML SOPN INJECT 1MG  INTO THE SKIN ONCE A WEEK 9 mL 1   benzonatate (TESSALON) 100 MG capsule Take 1 capsule (100 mg total) by mouth 3 (three) times daily as needed. 30 capsule 0   meloxicam (MOBIC) 7.5 MG tablet Take 1 tablet (7.5 mg total) by mouth daily as needed for pain. 30 tablet 1   No current facility-administered medications on file  prior to visit.    Review of Systems    Objective:    BP 120/78   Pulse 75   Temp 98.2 F (36.8 C)   Ht 6' (1.829 m)   Wt 175 lb (79.4 kg)   SpO2 99%   BMI 23.73 kg/m  BP Readings from Last 3 Encounters:  12/06/22 120/78  12/01/22 138/86  11/25/22 136/82   Wt Readings from Last 3 Encounters:  12/06/22 175 lb (79.4 kg)  12/01/22 174 lb (78.9 kg)  11/25/22 174 lb (78.9 kg)    Physical Exam

## 2022-12-07 DIAGNOSIS — M25561 Pain in right knee: Secondary | ICD-10-CM | POA: Insufficient documentation

## 2022-12-07 NOTE — Assessment & Plan Note (Signed)
Improved.  Right leg ultrasound negative DVT, Baker's cyst.  Patient declines baseline knee x-ray although suspect arthritic changes likely present. Consider imaging if pain persists.  Advised compression therapy with walking. Icing regimen. Patient will let me know how she is doing.

## 2022-12-08 ENCOUNTER — Ambulatory Visit: Payer: Medicare HMO | Admitting: Family

## 2022-12-13 ENCOUNTER — Ambulatory Visit (INDEPENDENT_AMBULATORY_CARE_PROVIDER_SITE_OTHER): Payer: Medicare HMO

## 2022-12-13 DIAGNOSIS — E538 Deficiency of other specified B group vitamins: Secondary | ICD-10-CM

## 2022-12-13 MED ORDER — CYANOCOBALAMIN 1000 MCG/ML IJ SOLN
1000.0000 ug | Freq: Once | INTRAMUSCULAR | Status: AC
  Administered 2022-12-13: 1000 ug via INTRAMUSCULAR

## 2022-12-13 NOTE — Progress Notes (Signed)
Pt presented for their vitamin B12 injection. Pt was identified through two identifiers. Pt tolerated shot well in their right deltoid.  

## 2022-12-20 ENCOUNTER — Other Ambulatory Visit (HOSPITAL_COMMUNITY): Payer: Self-pay

## 2022-12-29 ENCOUNTER — Encounter: Payer: Self-pay | Admitting: Family

## 2023-01-13 ENCOUNTER — Ambulatory Visit (INDEPENDENT_AMBULATORY_CARE_PROVIDER_SITE_OTHER): Payer: Medicare HMO

## 2023-01-13 DIAGNOSIS — E538 Deficiency of other specified B group vitamins: Secondary | ICD-10-CM | POA: Diagnosis not present

## 2023-01-13 MED ORDER — CYANOCOBALAMIN 1000 MCG/ML IJ SOLN
1000.0000 ug | Freq: Once | INTRAMUSCULAR | Status: AC
  Administered 2023-01-13: 1000 ug via INTRAMUSCULAR

## 2023-01-13 NOTE — Progress Notes (Signed)
Patient presented for B 12 injection to left deltoid, patient voiced no concerns nor showed any signs of distress during injection. 

## 2023-01-25 ENCOUNTER — Telehealth: Payer: Medicare HMO | Admitting: Nurse Practitioner

## 2023-01-25 ENCOUNTER — Other Ambulatory Visit: Payer: Self-pay | Admitting: Family

## 2023-01-25 DIAGNOSIS — R051 Acute cough: Secondary | ICD-10-CM | POA: Diagnosis not present

## 2023-01-25 DIAGNOSIS — H6501 Acute serous otitis media, right ear: Secondary | ICD-10-CM

## 2023-01-25 DIAGNOSIS — Z8701 Personal history of pneumonia (recurrent): Secondary | ICD-10-CM | POA: Diagnosis not present

## 2023-01-25 DIAGNOSIS — J0140 Acute pansinusitis, unspecified: Secondary | ICD-10-CM

## 2023-01-25 DIAGNOSIS — E119 Type 2 diabetes mellitus without complications: Secondary | ICD-10-CM

## 2023-01-25 DIAGNOSIS — I10 Essential (primary) hypertension: Secondary | ICD-10-CM

## 2023-01-25 MED ORDER — IPRATROPIUM BROMIDE 0.03 % NA SOLN: 2.0000 | Freq: Two times a day (BID) | NASAL | 12 refills | Status: DC

## 2023-01-25 MED ORDER — AZITHROMYCIN 250 MG PO TABS: ORAL_TABLET | ORAL | 0 refills | Status: AC

## 2023-01-25 NOTE — Progress Notes (Signed)
Virtual Visit Consent   Danielle Dickerson, you are scheduled for a virtual visit with a [facility] provider today. Just as with appointments in the office, your consent must be obtained to participate. Your consent will be active for this visit and any virtual visit you may have with one of our providers in the next 365 days. If you have a MyChart account, a copy of this consent can be sent to you electronically.  As this is a virtual visit, video technology does not allow for your provider to perform a traditional examination. This may limit your provider's ability to fully assess your condition. If your provider identifies any concerns that need to be evaluated in person or the need to arrange testing (such as labs, EKG, etc.), we will make arrangements to do so. Although advances in technology are sophisticated, we cannot ensure that it will always work on either your end or our end. If the connection with a video visit is poor, the visit may have to be switched to a telephone visit. With either a video or telephone visit, we are not always able to ensure that we have a secure connection.  By engaging in this virtual visit, you consent to the provision of healthcare and authorize for your insurance to be billed (if applicable) for the services provided during this visit. Depending on your insurance coverage, you may receive a charge related to this service.  I need to obtain your verbal consent now. Are you willing to proceed with your visit today? [name] has provided verbal consent on 01/25/2023 for a virtual visit (video or telephone). Apolonio Schneiders, FNP  Date: 01/25/2023 10:16 AM  Virtual Visit via Video Note   I, Apolonio Schneiders, connected with  Danielle Dickerson  ([id number], [birth year]) on 01/25/23 at 10:15 AM EST by a video-enabled telemedicine application and verified that I am speaking with the correct person using two identifiers.  Location: Patient: Virtual Visit Location Patient:  Home Provider: Virtual Visit Location Provider: Home Office   I discussed the limitations of evaluation and management by telemedicine and the availability of in person appointments. The patient expressed understanding and agreed to proceed.    History of Present Illness: AVARIELLA Dickerson is a 67 y.o. who identifies as a female who was assigned female at birth, and is being seen today for nasal congestion, cough, sneezing, ear pressure and earache.  She has fatigue  Body aches   Started feeling sick 5-6 days ago  Started taking Dayquil and was initially getting relief and then last night she started to feel worse.   She was up throughout the night  Ear pain was also new in the past 24 hours  She has had ear infections in the past   She denies a history of asthma or COPD She has had pneumonia in the past   She did take a COVID test this morning and it was negative     Problems:  Patient Active Problem List   Diagnosis Date Noted   Right knee pain 12/07/2022   Skin lesion 07/28/2022   Arm pain 07/28/2022   LLL pneumonia 05/10/2022   Medicare annual wellness visit, initial 04/04/2022   HTN (hypertension) 01/22/2022   Fatigue 09/30/2021   Right ureteral stone 09/07/2021   Bilious emesis 08/18/2021   Renal stone 08/18/2021   Pernicious anemia 07/03/2021   Initial Medicare annual wellness visit    Atypical squamous cell changes of undetermined significance (ASCUS) on cervical cytology  with negative high risk human papilloma virus (HPV) test result 05/08/2020   DM (diabetes mellitus) ([place]) 01/30/2020   HLD (hyperlipidemia) 01/30/2020   GERD (gastroesophageal reflux disease) 01/30/2020    Allergies:  Allergies  Allergen Reactions   Pneumovax 23 [Pneumococcal Vac Polyvalent] Swelling    Causes arm swelling, redness.   Prevnar 13 [Pneumococcal 13-Val Conj Vacc] Swelling   Medications:  Current Outpatient Medications:    Accu-Chek FastClix Lancets MISC, Used to check blood  sugars twice a day., Disp: 100 each, Rfl: 3   aspirin EC 81 MG tablet, Take 81 mg by mouth daily., Disp: , Rfl:    atorvastatin (LIPITOR) 40 MG tablet, TAKE 1 TABLET BY MOUTH EVERY DAY, Disp: 90 tablet, Rfl: 1   benzonatate (TESSALON) 100 MG capsule, Take 1 capsule (100 mg total) by mouth 3 (three) times daily as needed., Disp: 30 capsule, Rfl: 0   Blood Glucose Monitoring Suppl (ACCU-CHEK GUIDE ME) w/Device KIT, USE AS DIRECTED, Disp: 1 kit, Rfl: 0   Calcium-Vitamin D-Vitamin K (VIACTIV CALCIUM PLUS D PO), Take 1 tablet by mouth daily., Disp: , Rfl:    Cholecalciferol (VITAMIN D3 PO), Take 400 mg by mouth daily., Disp: , Rfl:    citalopram (CELEXA) 20 MG tablet, TAKE 1 TABLET BY MOUTH EVERY DAY, Disp: 90 tablet, Rfl: 2   famotidine (PEPCID) 20 MG tablet, Take 20 mg by mouth at bedtime., Disp: , Rfl:    ferrous sulfate 325 (65 FE) MG EC tablet, Take 325 mg by mouth 3 (three) times daily with meals., Disp: , Rfl:    glucose blood (ACCU-CHEK AVIVA PLUS) test strip, Used to check blood sugars twice a day., Disp: 100 each, Rfl: 5   Insulin Pen Needle (PEN NEEDLES) 32G X 4 MM MISC, Used to give Victoza injections., Disp: 100 each, Rfl: 1   Lancets (ONETOUCH ULTRASOFT) lancets, Used to check blood sugars once a day., Disp: 100 each, Rfl: 4   lisinopril (ZESTRIL) 5 MG tablet, TAKE 1 TABLET (5 MG TOTAL) BY MOUTH DAILY., Disp: 90 tablet, Rfl: 1   meloxicam (MOBIC) 7.5 MG tablet, Take 1 tablet (7.5 mg total) by mouth daily as needed for pain., Disp: 30 tablet, Rfl: 1   metFORMIN (GLUCOPHAGE) 1000 MG tablet, TAKE 1 TABLET (1,000 MG TOTAL) BY MOUTH TWICE A DAY WITH FOOD, Disp: 180 tablet, Rfl: 1   Multiple Vitamins-Minerals (CENTRUM SILVER ADULT 50+ PO), Take 1 tablet by mouth daily., Disp: , Rfl:    ondansetron (ZOFRAN ODT) 4 MG disintegrating tablet, Take 1 tablet (4 mg total) by mouth every 8 (eight) hours as needed., Disp: 30 tablet, Rfl: 1   pantoprazole (PROTONIX) 20 MG tablet, TAKE 1 TABLET (20 MG  TOTAL) BY MOUTH 2 (TWO) TIMES DAILY BEFORE A MEAL., Disp: 180 tablet, Rfl: 1   Semaglutide, 1 MG/DOSE, (OZEMPIC, 1 MG/DOSE,) 4 MG/3ML SOPN, INJECT '1MG'$  INTO THE SKIN ONCE A WEEK, Disp: 9 mL, Rfl: 1  Observations/Objective: Patient is well-developed, well-nourished in no acute distress.  Resting comfortably  at home.  Head is normocephalic, atraumatic.  No labored breathing.  Speech is clear and coherent with logical content.  Patient is alert and oriented at baseline.    Assessment and Plan: 1. Acute non-recurrent pansinusitis  - ipratropium (ATROVENT) 0.03 % nasal spray; Place 2 sprays into both nostrils every 12 (twelve) hours.  Dispense: 30 mL; Refill: 12  2. Acute cough Advised OTC Mucinex in combination with antibiotic   - azithromycin (ZITHROMAX) 250 MG tablet; Take 2  tablets on day 1, then 1 tablet daily on days 2 through 5  Dispense: 6 tablet; Refill: 0  3. Non-recurrent acute serous otitis media of right ear  - azithromycin (ZITHROMAX) 250 MG tablet; Take 2 tablets on day 1, then 1 tablet daily on days 2 through 5  Dispense: 6 tablet; Refill: 0  4. History of pneumonia  - azithromycin (ZITHROMAX) 250 MG tablet; Take 2 tablets on day 1, then 1 tablet daily on days 2 through 5  Dispense: 6 tablet; Refill: 0     Follow Up Instructions: I discussed the assessment and treatment plan with the patient. The patient was provided an opportunity to ask questions and all were answered. The patient agreed with the plan and demonstrated an understanding of the instructions.  A copy of instructions were sent to the patient via MyChart unless otherwise noted below.    The patient was advised to call back or seek an in-person evaluation if the symptoms worsen or if the condition fails to improve as anticipated.  Time:  I spent 11 minutes with the patient via telehealth technology discussing the above problems/concerns.    Apolonio Schneiders, FNP

## 2023-01-26 IMAGING — MG MM DIGITAL SCREENING BILAT W/ TOMO AND CAD
8 series · 8 of 24 positions shown · non-contrast
Comparison: Previous exam(s).

CLINICAL DATA: Screening.

EXAM:
DIGITAL SCREENING BILATERAL MAMMOGRAM WITH TOMOSYNTHESIS AND CAD
TECHNIQUE: Bilateral screening digital craniocaudal and mediolateral oblique
mammograms were obtained. Bilateral screening digital breast
tomosynthesis was performed. The images were evaluated with
computer-aided detection.

[R MLO synth-2D]
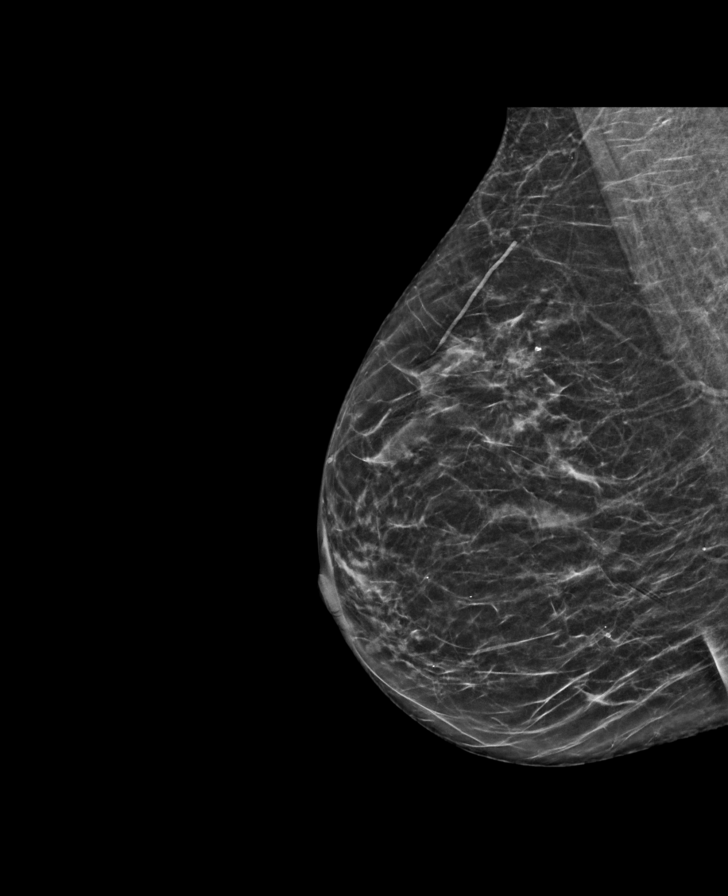

[L CC synth-2D]
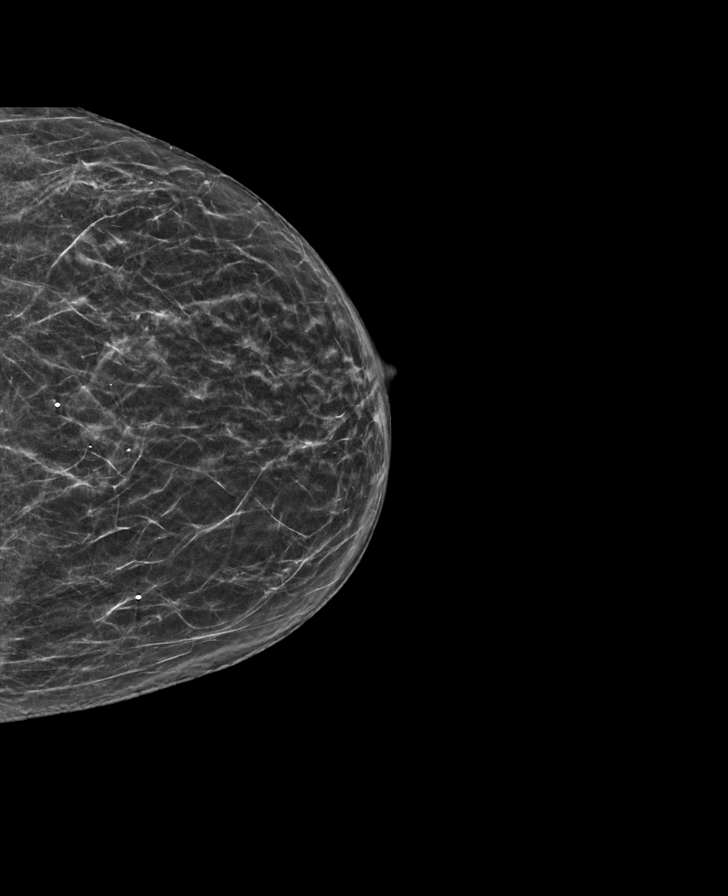

[R CC synth-2D]
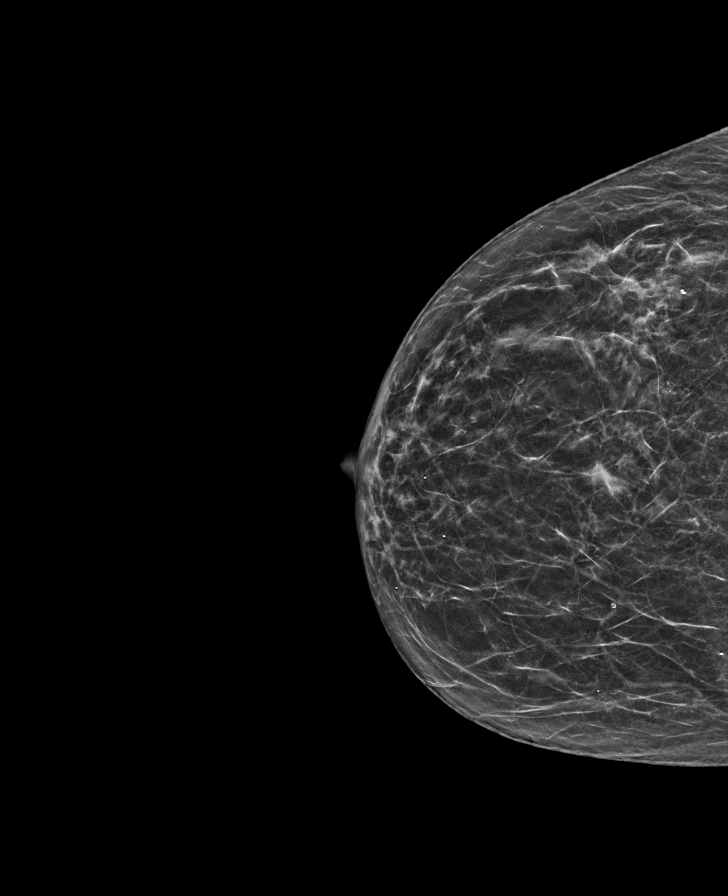

[L MLO synth-2D]
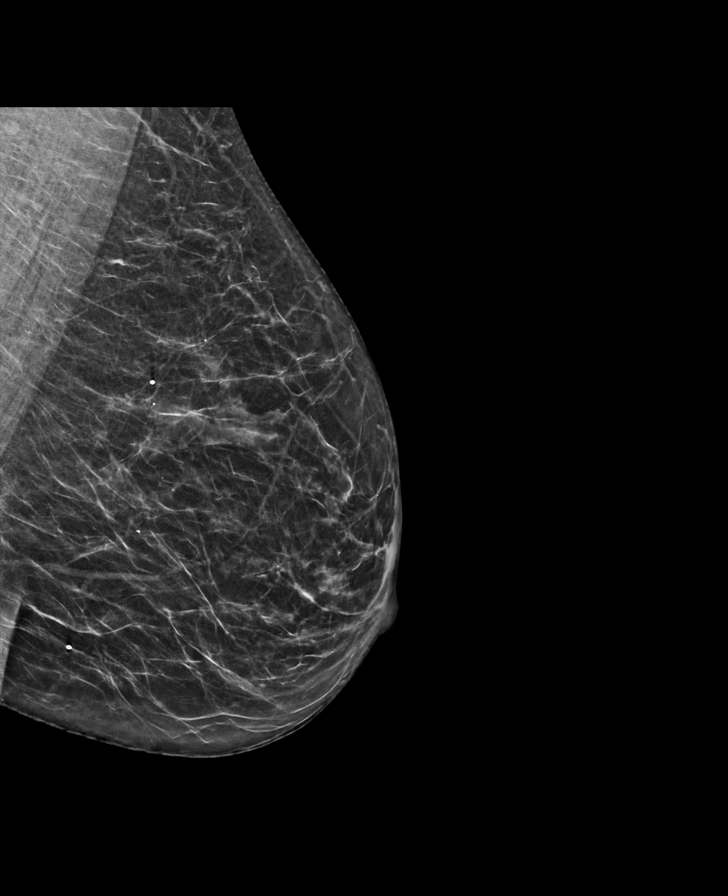

[L MLO tomo · tomo slice 30/59.0]
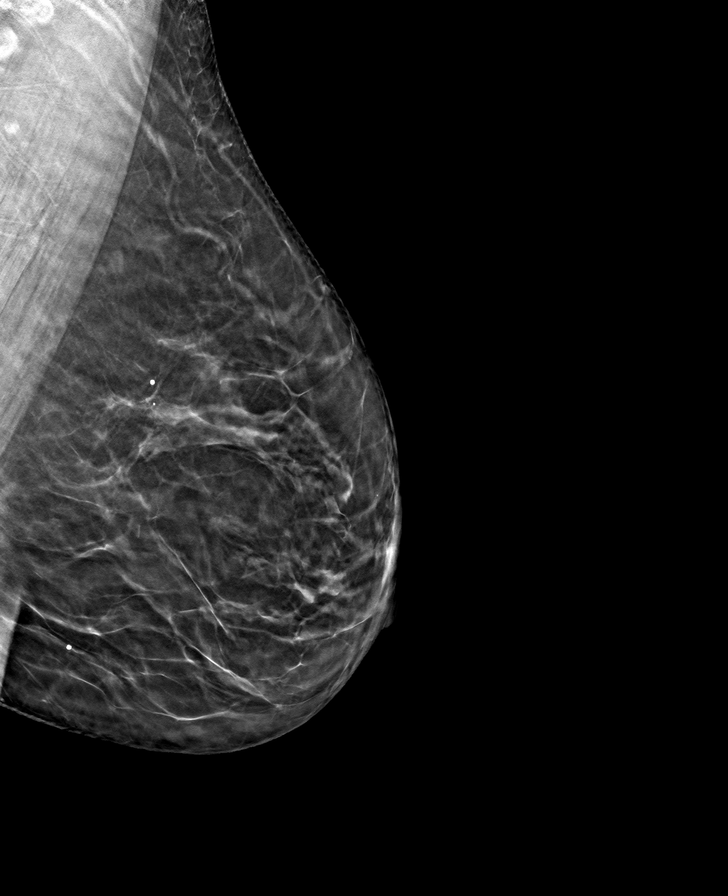

[R MLO tomo · tomo slice 30/59.0]
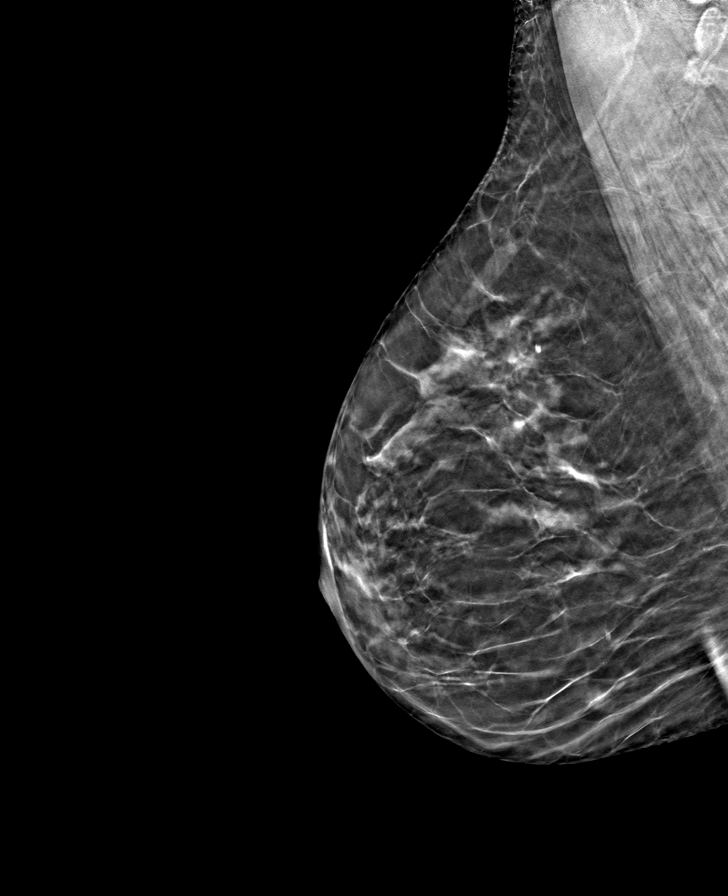

[R CC tomo · tomo slice 27/52.0]
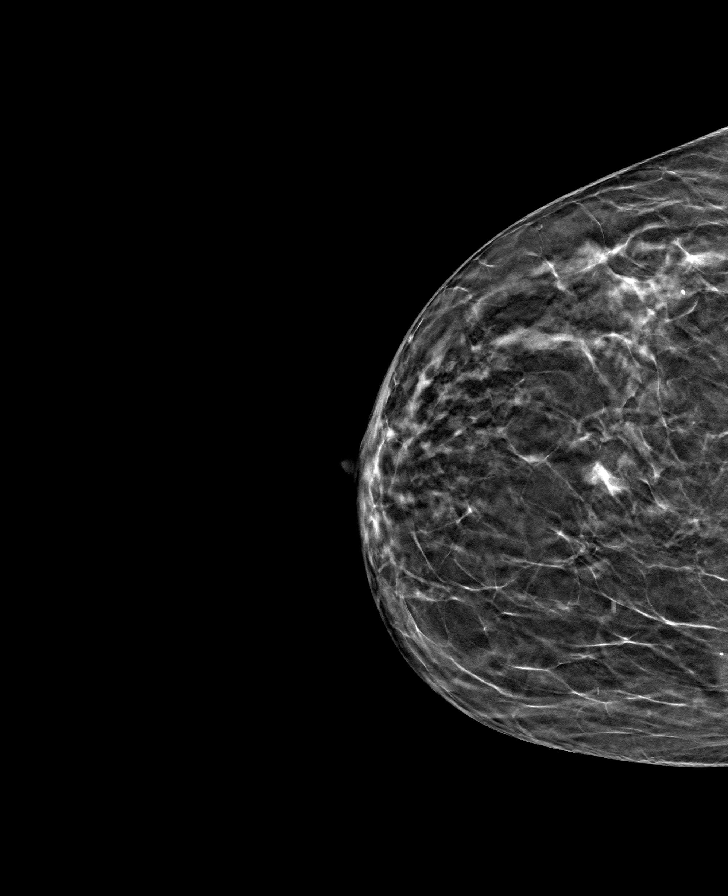

[L CC tomo · tomo slice 27/53.0]
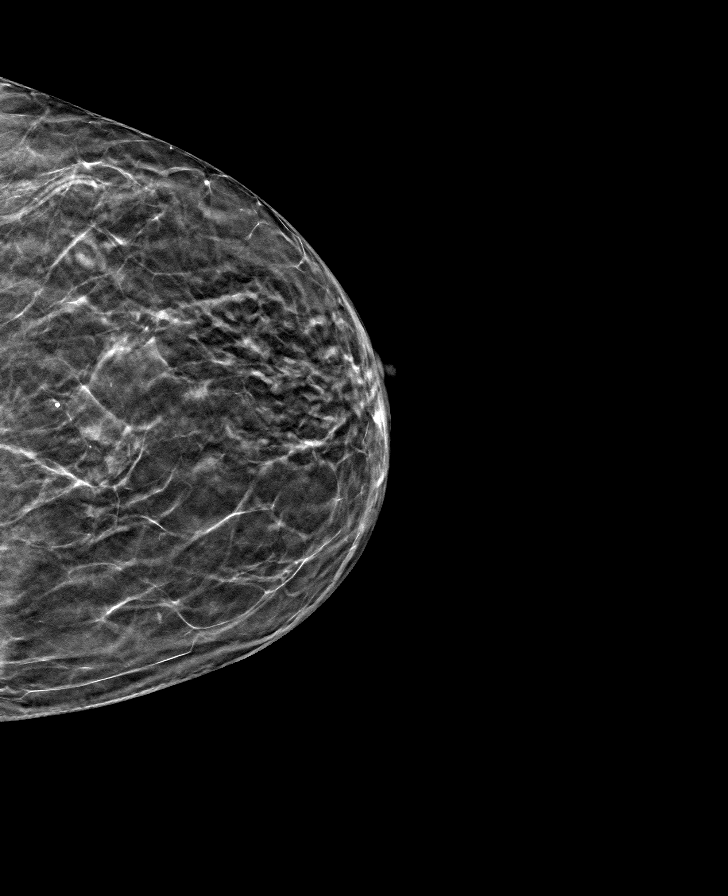

[8 of 24 positions shown; findings below may reference images not displayed]

ACR Breast Density Category b: There are scattered areas of
fibroglandular density.
FINDINGS: There are no findings suspicious for malignancy. Stable RIGHT breast
postsurgical changes.
IMPRESSION: No mammographic evidence of malignancy. A result letter of this
screening mammogram will be mailed directly to the patient.

RECOMMENDATION:
Screening mammogram in one year. (Code:ZU-E-1WA)

BI-RADS CATEGORY  2: Benign.

## 2023-01-28 ENCOUNTER — Ambulatory Visit: Payer: Medicare HMO | Admitting: Family

## 2023-01-28 IMAGING — CT CT CARDIAC CORONARY ARTERY CALCIUM SCORE
3 series · 14 of 20 positions shown, 16 images · non-contrast
Comparison: None.

Addendum:
CLINICAL DATA: Risk stratification

EXAM:
Coronary Calcium Score
TECHNIQUE: The patient was scanned on a Siemens Somatom go.Top Scanner. Axial
non-contrast 3 mm slices were carried out through the heart. The
data set was analyzed on a dedicated work station and scored using
the Agatson method.

[Series 2: sa36 calcium scoring 3.00 · axial · 0.35mm/px · z∈[-1116,-1035]mm · 4 of 46 slices shown]
[im 10/46  vessel]
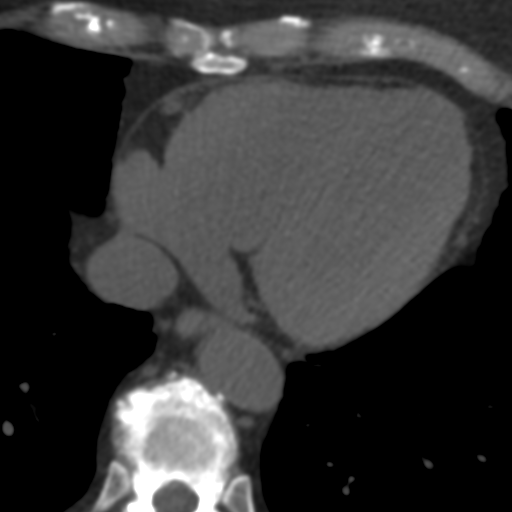
[im 19/46  vessel]
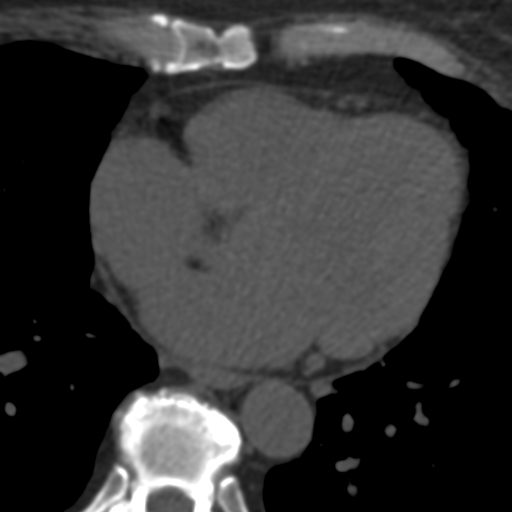
[im 28/46  vessel]
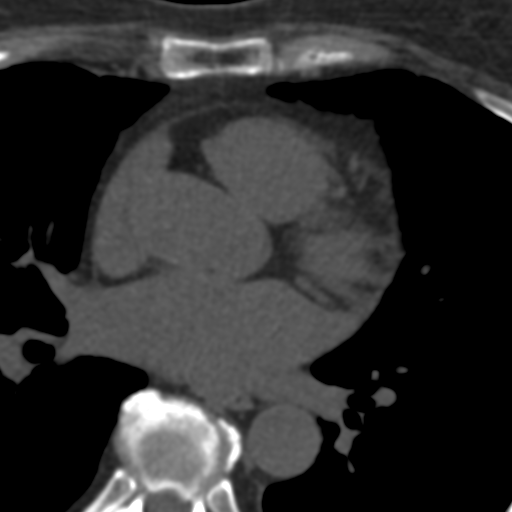
[im 37/46  vessel]
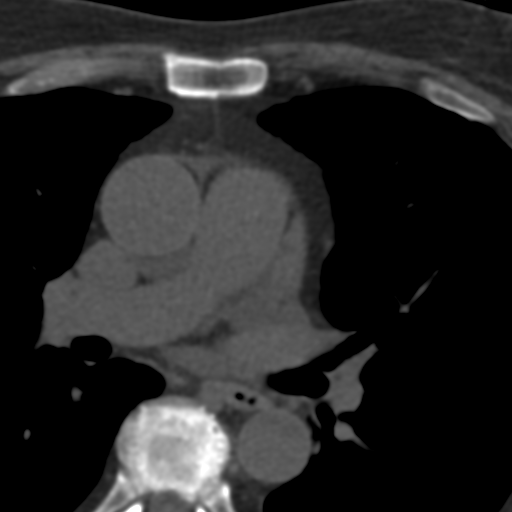

[Series 5: full fov st calcium scoring 3.00 · axial · 0.71mm/px · z∈[-1122,-1032]mm · 5 of 46 slices shown, 7 images]
[im 8/46  vessel]
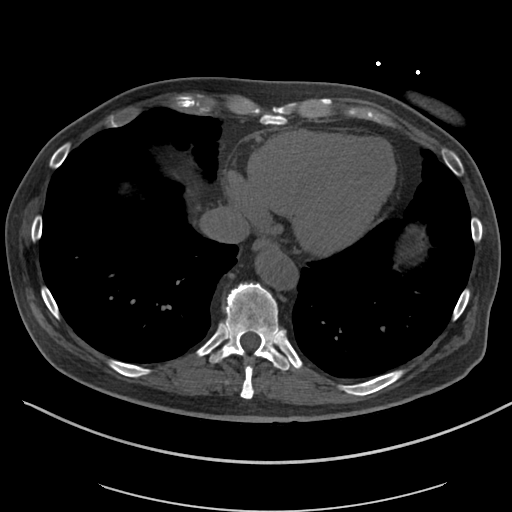
[im 8/46  lung]
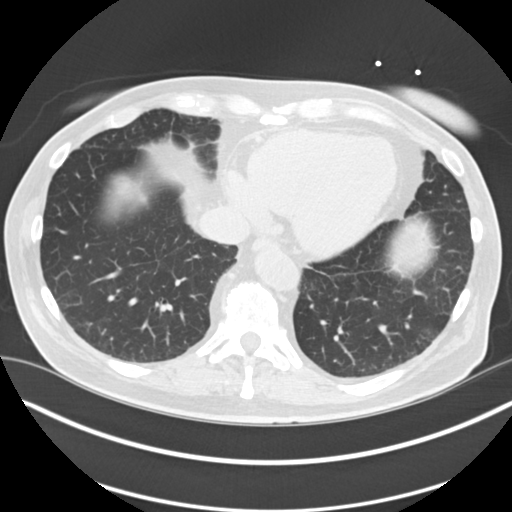
[im 16/46  vessel]
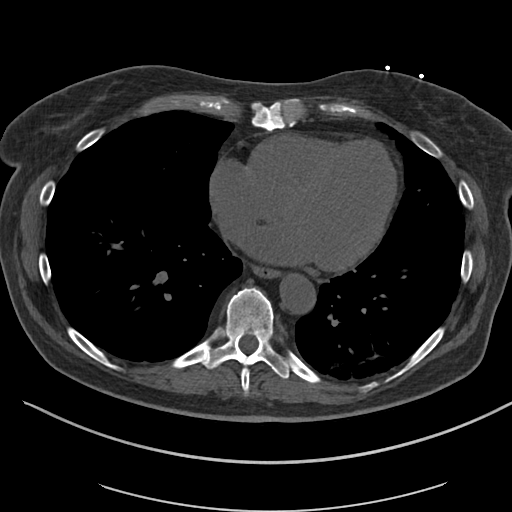
[im 23/46  vessel]
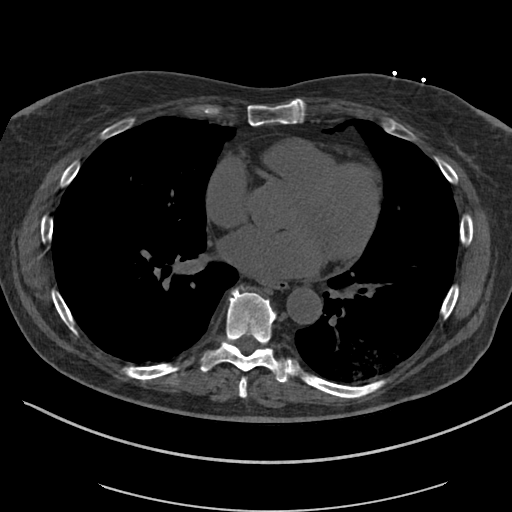
[im 31/46  vessel]
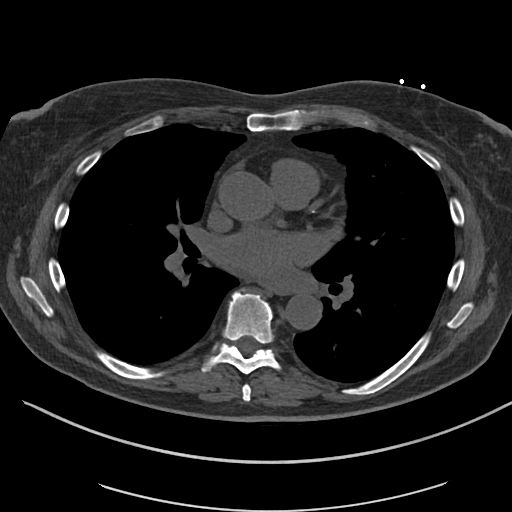
[im 38/46  vessel]
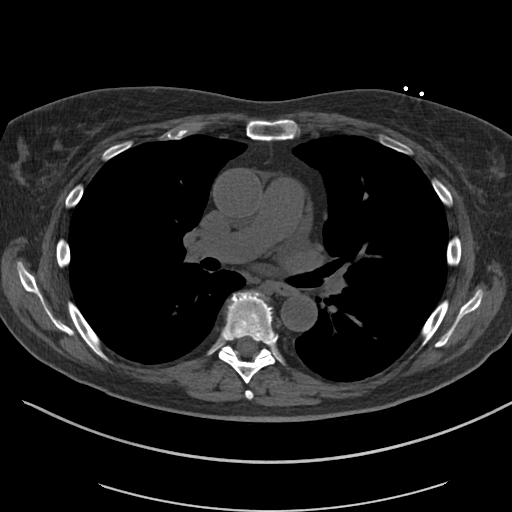
[im 38/46  lung]
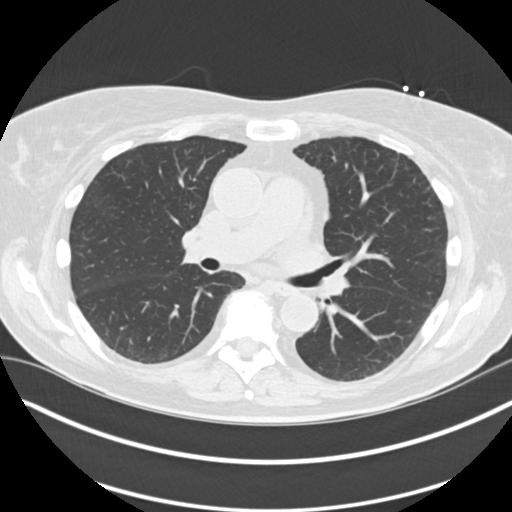

[Series 10: full fov lungs calcium scoring 3.00 ax · axial · 0.71mm/px · z∈[-1122,-1032]mm · 5 of 46 slices shown]
[im 8/46  vessel]
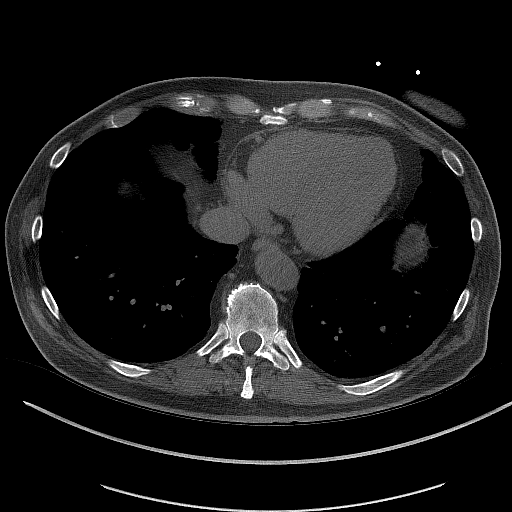
[im 16/46  vessel]
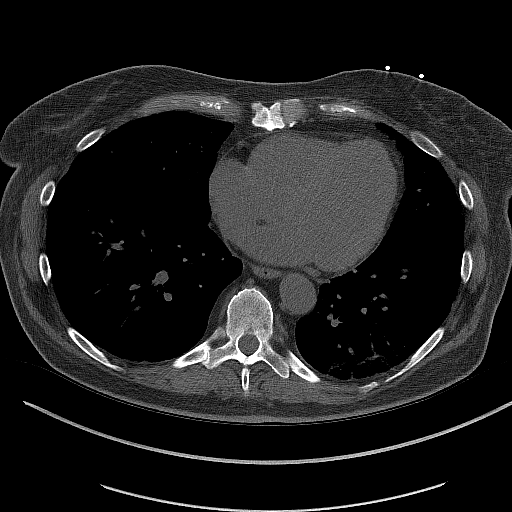
[im 23/46  vessel]
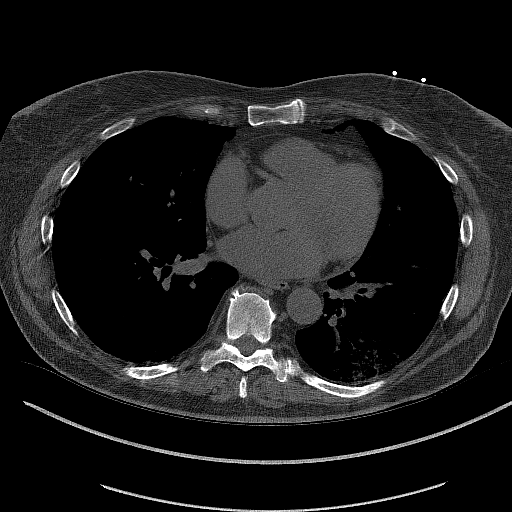
[im 31/46  vessel]
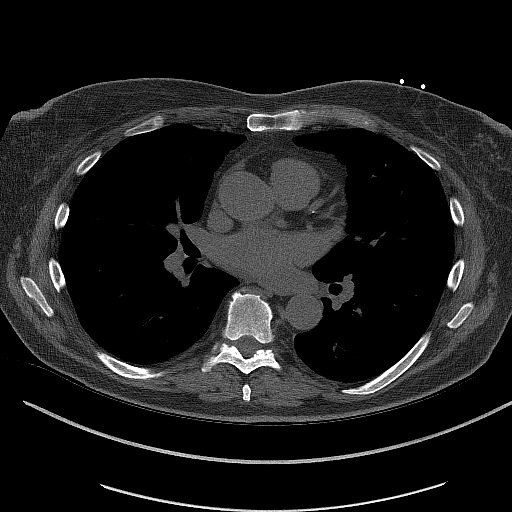
[im 38/46  vessel]
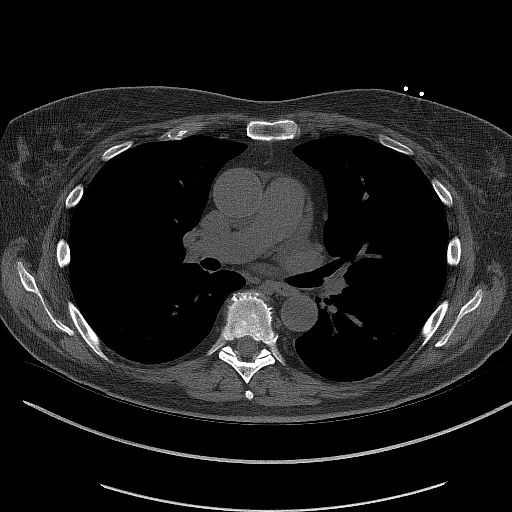

[14 of 20 positions shown; findings below may reference images not displayed]

FINDINGS: Non-cardiac: See separate report from [REDACTED].

Ascending Aorta: Normal size

Pericardium: Normal

Coronary arteries: Normal origin of left and right coronary
arteries. Distribution of arterial calcifications if present, as
noted below;

LM 0

LAD

LCx 0

RCA 0

Total

IMPRESSION AND RECOMMENDATION:
1. Coronary calcium score of 32.9. This was 67th percentile for age
and sex matched control.

2. CAC 1-99 in LAD. RAHIMUDIN KAMALAN1/N1.

3. Continue heart healthy lifestyle and risk factor modification.

EXAM:
OVER-READ INTERPRETATION  CT CHEST

The following report is an over-read performed by radiologist Dr.
does not include interpretation of cardiac or coronary anatomy or
pathology. The coronary calcium score interpretation by the
cardiologist is attached.
FINDINGS: Vascular: There are no significant vascular findings.

Mediastinum/Nodes: There are no enlarged lymph nodes.The visualized
esophagus demonstrates no significant findings.

Lungs/Pleura: Focal confluent ground-glass densities in the
posterior left lower lobe. No pneumothorax or pleural effusion.

Upper abdomen: No acute abnormality.

Musculoskeletal/Chest wall: No chest wall abnormality. No acute or
significant osseous findings.
IMPRESSION: 1. Focal confluent ground-glass densities in the posterior left
lower lobe, concerning for pneumonia or aspiration. Given patient's
age, follow-up noncontrast chest CT in 2-3 months is recommended to
evaluate for resolution.

These results will be called to the ordering clinician or
representative by the Radiologist Assistant, and communication
documented in the PACS or [REDACTED].

*** End of Addendum ***
FINDINGS: Non-cardiac: See separate report from [REDACTED].

Ascending Aorta: Normal size

Pericardium: Normal

Coronary arteries: Normal origin of left and right coronary
arteries. Distribution of arterial calcifications if present, as
noted below;

LM 0

LAD

LCx 0

RCA 0

Total

IMPRESSION AND RECOMMENDATION:
1. Coronary calcium score of 32.9. This was 67th percentile for age
and sex matched control.

2. CAC 1-99 in LAD. RAHIMUDIN KAMALAN1/N1.

3. Continue heart healthy lifestyle and risk factor modification.

## 2023-02-14 ENCOUNTER — Ambulatory Visit (INDEPENDENT_AMBULATORY_CARE_PROVIDER_SITE_OTHER): Payer: Medicare HMO

## 2023-02-14 DIAGNOSIS — E538 Deficiency of other specified B group vitamins: Secondary | ICD-10-CM

## 2023-02-14 MED ORDER — CYANOCOBALAMIN 1000 MCG/ML IJ SOLN
1000.0000 ug | Freq: Once | INTRAMUSCULAR | Status: AC
  Administered 2023-02-14: 1000 ug via INTRAMUSCULAR

## 2023-02-14 NOTE — Progress Notes (Signed)
Pt presented for their vitamin B12 injection. Pt was identified through two identifiers. Pt tolerated shot well in their right deltoid.  

## 2023-02-22 ENCOUNTER — Other Ambulatory Visit: Payer: Self-pay | Admitting: Family

## 2023-02-24 ENCOUNTER — Other Ambulatory Visit: Payer: Self-pay | Admitting: Family

## 2023-02-24 DIAGNOSIS — K219 Gastro-esophageal reflux disease without esophagitis: Secondary | ICD-10-CM

## 2023-03-07 ENCOUNTER — Encounter: Payer: Self-pay | Admitting: Family

## 2023-03-07 ENCOUNTER — Ambulatory Visit (INDEPENDENT_AMBULATORY_CARE_PROVIDER_SITE_OTHER): Payer: Medicare HMO | Admitting: Family

## 2023-03-07 VITALS — BP 128/78 | HR 69 | Temp 97.8°F | Ht 72.0 in | Wt 172.2 lb

## 2023-03-07 DIAGNOSIS — E119 Type 2 diabetes mellitus without complications: Secondary | ICD-10-CM

## 2023-03-07 DIAGNOSIS — R110 Nausea: Secondary | ICD-10-CM | POA: Diagnosis not present

## 2023-03-07 DIAGNOSIS — R7309 Other abnormal glucose: Secondary | ICD-10-CM | POA: Diagnosis not present

## 2023-03-07 DIAGNOSIS — J40 Bronchitis, not specified as acute or chronic: Secondary | ICD-10-CM | POA: Insufficient documentation

## 2023-03-07 DIAGNOSIS — D649 Anemia, unspecified: Secondary | ICD-10-CM | POA: Diagnosis not present

## 2023-03-07 DIAGNOSIS — R0981 Nasal congestion: Secondary | ICD-10-CM

## 2023-03-07 DIAGNOSIS — I10 Essential (primary) hypertension: Secondary | ICD-10-CM

## 2023-03-07 LAB — CBC WITH DIFFERENTIAL/PLATELET
Basophils Absolute: 0 10*3/uL (ref 0.0–0.1)
Basophils Relative: 0.3 % (ref 0.0–3.0)
Eosinophils Absolute: 0.2 10*3/uL (ref 0.0–0.7)
Eosinophils Relative: 2.1 % (ref 0.0–5.0)
HCT: 34.2 % — ABNORMAL LOW (ref 36.0–46.0)
Hemoglobin: 11.8 g/dL — ABNORMAL LOW (ref 12.0–15.0)
Lymphocytes Relative: 19.1 % (ref 12.0–46.0)
Lymphs Abs: 1.5 10*3/uL (ref 0.7–4.0)
MCHC: 34.4 g/dL (ref 30.0–36.0)
MCV: 86 fl (ref 78.0–100.0)
Monocytes Absolute: 0.4 10*3/uL (ref 0.1–1.0)
Monocytes Relative: 4.7 % (ref 3.0–12.0)
Neutro Abs: 5.8 10*3/uL (ref 1.4–7.7)
Neutrophils Relative %: 73.8 % (ref 43.0–77.0)
Platelets: 172 10*3/uL (ref 150.0–400.0)
RBC: 3.98 Mil/uL (ref 3.87–5.11)
RDW: 13.3 % (ref 11.5–15.5)
WBC: 7.9 10*3/uL (ref 4.0–10.5)

## 2023-03-07 LAB — POCT GLYCOSYLATED HEMOGLOBIN (HGB A1C)

## 2023-03-07 LAB — COMPREHENSIVE METABOLIC PANEL
ALT: 14 U/L (ref 0–35)
AST: 16 U/L (ref 0–37)
Albumin: 4.2 g/dL (ref 3.5–5.2)
Alkaline Phosphatase: 66 U/L (ref 39–117)
BUN: 16 mg/dL (ref 6–23)
CO2: 28 mEq/L (ref 19–32)
Calcium: 9.4 mg/dL (ref 8.4–10.5)
Chloride: 104 mEq/L (ref 96–112)
Creatinine, Ser: 0.81 mg/dL (ref 0.40–1.20)
GFR: 75.39 mL/min (ref >60.00)
Glucose, Bld: 124 mg/dL — ABNORMAL HIGH (ref 70–99)
Potassium: 3.9 mEq/L (ref 3.5–5.1)
Sodium: 140 mEq/L (ref 135–145)
Total Bilirubin: 0.4 mg/dL (ref 0.2–1.2)
Total Protein: 6.2 g/dL (ref 6.0–8.3)

## 2023-03-07 LAB — IBC + FERRITIN
Ferritin: 19.9 ng/mL (ref 10.0–291.0)
Iron: 53 ug/dL (ref 42–145)
Saturation Ratios: 16.8 % — ABNORMAL LOW (ref 20.0–50.0)
TIBC: 315 ug/dL (ref 250.0–450.0)
Transferrin: 225 mg/dL (ref 212.0–360.0)

## 2023-03-07 MED ORDER — ONDANSETRON 4 MG PO TBDP: 4.0000 mg | ORAL_TABLET | Freq: Three times a day (TID) | ORAL | 1 refills | Status: DC | PRN

## 2023-03-07 NOTE — Assessment & Plan Note (Signed)
Lab Results  Component Value Date   HGBA1C 6.1 (A) 10/29/2022   Chronic, stable.  Continue metformin 1000 mg twice daily, Ozempic 1 mg

## 2023-03-07 NOTE — Progress Notes (Signed)
Assessment & Plan:  Anemia, unspecified type -     CBC with Differential/Platelet -     Comprehensive metabolic panel -     IBC + Ferritin  Nausea -     Ondansetron; Take 1 tablet (4 mg total) by mouth every 8 (eight) hours as needed.  Dispense: 30 tablet; Refill: 1  Elevated glucose -     POCT glycosylated hemoglobin (Hb A1C)  Type 2 diabetes mellitus without complication, without long-term current use of insulin Assessment & Plan: Lab Results  Component Value Date   HGBA1C 6.1 (A) 10/29/2022   Chronic, stable.  Continue metformin 1000 mg twice daily, Ozempic 1 mg   Primary hypertension Assessment & Plan: Chronic, stable.  Continue lisinopril 5 mg QD   Sinus congestion Assessment & Plan: Reassuring exam.  Question if related to allergies or viral etiology.  Advised patient to resume ipratropium nasal spray and to start OTC antihistamine.  If no improvement, she will let me know, and likely will start doxycycline.      Return precautions given.   Risks, benefits, and alternatives of the medications and treatment plan prescribed today were discussed, and patient expressed understanding.   Education regarding symptom management and diagnosis given to patient on AVS either electronically or printed.  No follow-ups on file.  Rennie Plowman, FNP  Subjective:    Patient ID: Danielle Dickerson, female    DOB: 1956-05-12, 67 y.o.   MRN: [id number]  CC: Danielle Dickerson is a 67 y.o. female who presents today for follow up.   HPI: Complains of sinus pressure, nasal congestion x weeek  Thin clear nasal congestin.  Some ear popping.   No fever, sob, wheezing, leg swelling.  She thinks pollen is contributing She has taken an occasional nyquil/dayquil  Treated with zpak 01/25/23, 11/30/22   She has very occassional nausea.  Nausea has improved from prior.  She does not think nausea is related to Ozempic.  she requests refill of zofran. Compliant with pepcid, protonix 20mg   bid. No epigastric burning.       Allergies: Pneumovax 23 [pneumococcal vac polyvalent] and Prevnar 13 [pneumococcal 13-val conj vacc] Current Outpatient Medications on File Prior to Visit  Medication Sig Dispense Refill   Accu-Chek FastClix Lancets MISC Used to check blood sugars twice a day. 100 each 3   aspirin EC 81 MG tablet Take 81 mg by mouth daily.     atorvastatin (LIPITOR) 40 MG tablet TAKE 1 TABLET BY MOUTH EVERY DAY 90 tablet 1   benzonatate (TESSALON) 100 MG capsule Take 1 capsule (100 mg total) by mouth 3 (three) times daily as needed. 30 capsule 0   Blood Glucose Monitoring Suppl (ACCU-CHEK GUIDE ME) w/Device KIT USE AS DIRECTED 1 kit 0   Calcium-Vitamin D-Vitamin K (VIACTIV CALCIUM PLUS D PO) Take 1 tablet by mouth daily.     Cholecalciferol (VITAMIN D3 PO) Take 400 mg by mouth daily.     citalopram (CELEXA) 20 MG tablet TAKE 1 TABLET BY MOUTH EVERY DAY 90 tablet 2   famotidine (PEPCID) 20 MG tablet Take 20 mg by mouth at bedtime.     ferrous sulfate 325 (65 FE) MG EC tablet Take 325 mg by mouth 3 (three) times daily with meals.     glucose blood (ACCU-CHEK AVIVA PLUS) test strip Used to check blood sugars twice a day. 100 each 5   Insulin Pen Needle (PEN NEEDLES) 32G X 4 MM MISC Used to give Victoza injections. 100  each 1   ipratropium (ATROVENT) 0.03 % nasal spray Place 2 sprays into both nostrils every 12 (twelve) hours. 30 mL 12   Lancets (ONETOUCH ULTRASOFT) lancets Used to check blood sugars once a day. 100 each 4   lisinopril (ZESTRIL) 5 MG tablet TAKE 1 TABLET (5 MG TOTAL) BY MOUTH DAILY. 90 tablet 1   meloxicam (MOBIC) 7.5 MG tablet Take 1 tablet (7.5 mg total) by mouth daily as needed for pain. 30 tablet 1   metFORMIN (GLUCOPHAGE) 1000 MG tablet TAKE 1 TABLET (1,000 MG TOTAL) BY MOUTH TWICE A DAY WITH FOOD 180 tablet 1   Multiple Vitamins-Minerals (CENTRUM SILVER ADULT 50+ PO) Take 1 tablet by mouth daily.     pantoprazole (PROTONIX) 20 MG tablet TAKE 1 TABLET (20  MG TOTAL) BY MOUTH 2 (TWO) TIMES DAILY BEFORE A MEAL. 180 tablet 1   Semaglutide, 1 MG/DOSE, (OZEMPIC, 1 MG/DOSE,) 4 MG/3ML SOPN INJECT 1 MG INTO THE SKIN ONE TIME PER WEEK 9 mL 0   No current facility-administered medications on file prior to visit.    Review of Systems  Constitutional:  Negative for chills and fever.  HENT:  Positive for congestion, postnasal drip and sinus pain.   Respiratory:  Negative for cough.   Cardiovascular:  Negative for chest pain and palpitations.  Gastrointestinal:  Negative for nausea and vomiting.      Objective:    BP 128/78   Pulse 69   Temp 97.8 F (36.6 C) (Oral)   Ht 6' (1.829 m)   Wt 172 lb 3.2 oz (78.1 kg)   SpO2 96%   BMI 23.35 kg/m  BP Readings from Last 3 Encounters:  03/07/23 128/78  12/06/22 120/78  12/01/22 138/86   Wt Readings from Last 3 Encounters:  03/07/23 172 lb 3.2 oz (78.1 kg)  12/06/22 175 lb (79.4 kg)  12/01/22 174 lb (78.9 kg)    Physical Exam Vitals reviewed.  Constitutional:      Appearance: She is well-developed.  HENT:     Head: Normocephalic and atraumatic.     Right Ear: Hearing, tympanic membrane, ear canal and external ear normal. No decreased hearing noted. No drainage, swelling or tenderness. No middle ear effusion. No foreign body. Tympanic membrane is not erythematous or bulging.     Left Ear: Hearing, tympanic membrane, ear canal and external ear normal. No decreased hearing noted. No drainage, swelling or tenderness.  No middle ear effusion. No foreign body. Tympanic membrane is not erythematous or bulging.     Nose: Nose normal. No rhinorrhea.     Right Sinus: No maxillary sinus tenderness or frontal sinus tenderness.     Left Sinus: No maxillary sinus tenderness or frontal sinus tenderness.     Mouth/Throat:     Pharynx: Uvula midline. No oropharyngeal exudate or posterior oropharyngeal erythema.     Tonsils: No tonsillar abscesses.  Eyes:     Conjunctiva/sclera: Conjunctivae normal.   Cardiovascular:     Rate and Rhythm: Regular rhythm.     Pulses: Normal pulses.     Heart sounds: Normal heart sounds.  Pulmonary:     Effort: Pulmonary effort is normal.     Breath sounds: Normal breath sounds. No wheezing, rhonchi or rales.  Lymphadenopathy:     Head:     Right side of head: No submental, submandibular, tonsillar, preauricular, posterior auricular or occipital adenopathy.     Left side of head: No submental, submandibular, tonsillar, preauricular, posterior auricular or occipital adenopathy.  Cervical: No cervical adenopathy.  Skin:    General: Skin is warm and dry.  Neurological:     Mental Status: She is alert.  Psychiatric:        Speech: Speech normal.        Behavior: Behavior normal.        Thought Content: Thought content normal.

## 2023-03-07 NOTE — Assessment & Plan Note (Signed)
Reassuring exam.  Question if related to allergies or viral etiology.  Advised patient to resume ipratropium nasal spray and to start OTC antihistamine.  If no improvement, she will let me know, and likely will start doxycycline.

## 2023-03-07 NOTE — Patient Instructions (Signed)
Start ipatrpopirum, over the conter claritin daily.

## 2023-03-07 NOTE — Assessment & Plan Note (Signed)
Chronic, stable.  Continue lisinopril 5 mg QD 

## 2023-03-09 ENCOUNTER — Other Ambulatory Visit: Payer: Self-pay | Admitting: Family

## 2023-03-09 ENCOUNTER — Encounter: Payer: Self-pay | Admitting: Family

## 2023-03-09 DIAGNOSIS — R0981 Nasal congestion: Secondary | ICD-10-CM

## 2023-03-09 MED ORDER — DOXYCYCLINE HYCLATE 100 MG PO TABS
100.0000 mg | ORAL_TABLET | Freq: Two times a day (BID) | ORAL | 0 refills | Status: AC
Start: 2023-03-09 — End: 2023-03-16

## 2023-03-17 ENCOUNTER — Ambulatory Visit (INDEPENDENT_AMBULATORY_CARE_PROVIDER_SITE_OTHER): Payer: Medicare HMO

## 2023-03-17 DIAGNOSIS — E538 Deficiency of other specified B group vitamins: Secondary | ICD-10-CM | POA: Diagnosis not present

## 2023-03-17 MED ORDER — CYANOCOBALAMIN 1000 MCG/ML IJ SOLN
1000.0000 ug | Freq: Once | INTRAMUSCULAR | Status: AC
Start: 2023-03-17 — End: 2023-03-17
  Administered 2023-03-17: 1000 ug via INTRAMUSCULAR

## 2023-03-17 NOTE — Progress Notes (Signed)
Patient presented for B 12 injection to left deltoid, patient voiced no concerns nor showed any signs of distress during injection. 

## 2023-03-18 ENCOUNTER — Other Ambulatory Visit: Payer: Self-pay | Admitting: Family

## 2023-03-18 ENCOUNTER — Ambulatory Visit: Payer: Medicare HMO

## 2023-03-18 DIAGNOSIS — R051 Acute cough: Secondary | ICD-10-CM

## 2023-03-18 MED ORDER — PROMETHAZINE-DM 6.25-15 MG/5ML PO SYRP
5.0000 mL | ORAL_SOLUTION | Freq: Every evening | ORAL | 1 refills | Status: DC | PRN
Start: 2023-03-18 — End: 2024-05-02

## 2023-03-18 NOTE — Telephone Encounter (Signed)
Spoke to pt doxcycycline did not work would like cough medicine called in. Pt scheduled appt to come in for f/up on 4/22

## 2023-03-18 NOTE — Telephone Encounter (Signed)
Pt returned [facility] CMA call. Note below was read to her. As per pt request, she would like something to be sent to [facility] on [address].

## 2023-03-18 NOTE — Telephone Encounter (Signed)
LVM to call back to see if pt wants to schedule appt to come in to office or would just like [place] to send something in.

## 2023-03-21 ENCOUNTER — Ambulatory Visit (INDEPENDENT_AMBULATORY_CARE_PROVIDER_SITE_OTHER): Payer: Medicare HMO | Admitting: Family

## 2023-03-21 ENCOUNTER — Encounter: Payer: Self-pay | Admitting: Family

## 2023-03-21 ENCOUNTER — Other Ambulatory Visit: Payer: Self-pay | Admitting: Family

## 2023-03-21 ENCOUNTER — Ambulatory Visit (INDEPENDENT_AMBULATORY_CARE_PROVIDER_SITE_OTHER): Payer: Medicare HMO

## 2023-03-21 VITALS — BP 118/78 | HR 64 | Temp 97.7°F | Ht 72.0 in | Wt 172.4 lb

## 2023-03-21 DIAGNOSIS — Z1231 Encounter for screening mammogram for malignant neoplasm of breast: Secondary | ICD-10-CM

## 2023-03-21 DIAGNOSIS — J40 Bronchitis, not specified as acute or chronic: Secondary | ICD-10-CM | POA: Diagnosis not present

## 2023-03-21 NOTE — Assessment & Plan Note (Signed)
Reassuring exam.  Nontoxic in appearance.  Patient has a history of pneumonia.  Pending chest x-ray.  Discussed recent use twice of azithromycin as well as standard care acute uncomplicated rhinosinusitis is to use Augmentin.  In the past azithromycin has been more effective for patient and she is inclined to start this again.  She politely declines CT sinus to further investigate anatomical reason for recurrent sinusitis this calendar year at this time.  We jointly agreed to have a low threshold to obtain CT sinus in the near future if patient symptoms were to persist or she develop another sinus infection.  Start azithromycin, probiotics

## 2023-03-21 NOTE — Telephone Encounter (Signed)
NOTED

## 2023-03-21 NOTE — Progress Notes (Signed)
Assessment & Plan:  Bronchitis Assessment & Plan: Reassuring exam.  Nontoxic in appearance.  Patient has a history of pneumonia.  Pending chest x-ray.  Discussed recent use twice of azithromycin as well as standard care acute uncomplicated rhinosinusitis is to use Augmentin.  In the past azithromycin has been more effective for patient and she is inclined to start this again.  She politely declines CT sinus to further investigate anatomical reason for recurrent sinusitis this calendar year at this time.  We jointly agreed to have a low threshold to obtain CT sinus in the near future if patient symptoms were to persist or she develop another sinus infection.  Start azithromycin, probiotics  Orders: -     DG Chest 2 View; Future     Return precautions given.   Risks, benefits, and alternatives of the medications and treatment plan prescribed today were discussed, and patient expressed understanding.   Education regarding symptom management and diagnosis given to patient on AVS either electronically or printed.  No follow-ups on file.  Rennie Plowman, FNP  Subjective:    Patient ID: Danielle Dickerson, female    DOB: Jul 28, 1956, 67 y.o.   MRN: [id number]  CC: Danielle Dickerson is a 67 y.o. female who presents today for an acute visit.    HPI: She complains of nasal congestion and dry cough x 2 weeks, waxed and waned.   She used afrin last night with some relief. Cough and the 'tickle' has improved. Cough is worse at night. Sore throat resolved.  No sinus pain, HA, ear pain, sore throat, fever, wheezing, shortness of breath  She tried antihistamine without relief.         Doxycycline  BID, completed 03/16/23 however didn't feel it was as effective.   History LLL PNA 2023  Never smoker  Zpak 01/25/23, Zpak 11/30/22   Allergies: Pneumovax 23 [pneumococcal vac polyvalent] and Prevnar 13 [pneumococcal 13-val conj vacc] Current Outpatient Medications on File Prior to Visit   Medication Sig Dispense Refill   Accu-Chek FastClix Lancets MISC Used to check blood sugars twice a day. 100 each 3   aspirin EC 81 MG tablet Take 81 mg by mouth daily.     atorvastatin (LIPITOR) 40 MG tablet TAKE 1 TABLET BY MOUTH EVERY DAY 90 tablet 1   Blood Glucose Monitoring Suppl (ACCU-CHEK GUIDE ME) w/Device KIT USE AS DIRECTED 1 kit 0   Calcium-Vitamin D-Vitamin K (VIACTIV CALCIUM PLUS D PO) Take 1 tablet by mouth daily.     Cholecalciferol (VITAMIN D3 PO) Take 400 mg by mouth daily.     citalopram (CELEXA) 20 MG tablet TAKE 1 TABLET BY MOUTH EVERY DAY 90 tablet 2   famotidine (PEPCID) 20 MG tablet Take 20 mg by mouth at bedtime.     ferrous sulfate 325 (65 FE) MG EC tablet Take 325 mg by mouth 3 (three) times daily with meals.     glucose blood (ACCU-CHEK AVIVA PLUS) test strip Used to check blood sugars twice a day. 100 each 5   Insulin Pen Needle (PEN NEEDLES) 32G X 4 MM MISC Used to give Victoza injections. 100 each 1   ipratropium (ATROVENT) 0.03 % nasal spray Place 2 sprays into both nostrils every 12 (twelve) hours. 30 mL 12   Lancets (ONETOUCH ULTRASOFT) lancets Used to check blood sugars once a day. 100 each 4   lisinopril (ZESTRIL) 5 MG tablet TAKE 1 TABLET (5 MG TOTAL) BY MOUTH DAILY. 90 tablet 1   meloxicam (  MOBIC) 7.5 MG tablet Take 1 tablet (7.5 mg total) by mouth daily as needed for pain. 30 tablet 1   metFORMIN (GLUCOPHAGE) 1000 MG tablet TAKE 1 TABLET (1,000 MG TOTAL) BY MOUTH TWICE A DAY WITH FOOD 180 tablet 1   Multiple Vitamins-Minerals (CENTRUM SILVER ADULT 50+ PO) Take 1 tablet by mouth daily.     ondansetron (ZOFRAN ODT) 4 MG disintegrating tablet Take 1 tablet (4 mg total) by mouth every 8 (eight) hours as needed. 30 tablet 1   pantoprazole (PROTONIX) 20 MG tablet TAKE 1 TABLET (20 MG TOTAL) BY MOUTH 2 (TWO) TIMES DAILY BEFORE A MEAL. 180 tablet 1   promethazine-dextromethorphan (PROMETHAZINE-DM) 6.25-15 MG/5ML syrup Take 5 mLs by mouth at bedtime as needed for  cough. 45 mL 1   Semaglutide, 1 MG/DOSE, (OZEMPIC, 1 MG/DOSE,) 4 MG/3ML SOPN INJECT 1 MG INTO THE SKIN ONE TIME PER WEEK 9 mL 0   No current facility-administered medications on file prior to visit.    Review of Systems  Constitutional:  Negative for chills and fever.  HENT:  Positive for congestion. Negative for sinus pressure and sore throat.   Respiratory:  Positive for cough. Negative for shortness of breath and wheezing.   Cardiovascular:  Negative for chest pain and palpitations.  Gastrointestinal:  Negative for nausea and vomiting.      Objective:    BP 118/78   Pulse 64   Temp 97.7 F (36.5 C) (Oral)   Ht 6' (1.829 m)   Wt 172 lb 6.4 oz (78.2 kg)   SpO2 99%   BMI 23.38 kg/m   BP Readings from Last 3 Encounters:  03/21/23 118/78  03/07/23 128/78  12/06/22 120/78   Wt Readings from Last 3 Encounters:  03/21/23 172 lb 6.4 oz (78.2 kg)  03/07/23 172 lb 3.2 oz (78.1 kg)  12/06/22 175 lb (79.4 kg)    Physical Exam Vitals reviewed.  Constitutional:      Appearance: She is well-developed.  HENT:     Head: Normocephalic and atraumatic.     Right Ear: Hearing, tympanic membrane, ear canal and external ear normal. No decreased hearing noted. No drainage, swelling or tenderness. No middle ear effusion. No foreign body. Tympanic membrane is not erythematous or bulging.     Left Ear: Hearing, tympanic membrane, ear canal and external ear normal. No decreased hearing noted. No drainage, swelling or tenderness.  No middle ear effusion. No foreign body. Tympanic membrane is not erythematous or bulging.     Nose: Nose normal. No rhinorrhea.     Right Sinus: No maxillary sinus tenderness or frontal sinus tenderness.     Left Sinus: No maxillary sinus tenderness or frontal sinus tenderness.     Mouth/Throat:     Pharynx: Uvula midline. No oropharyngeal exudate or posterior oropharyngeal erythema.     Tonsils: No tonsillar abscesses.  Eyes:     Conjunctiva/sclera: Conjunctivae  normal.  Cardiovascular:     Rate and Rhythm: Regular rhythm.     Pulses: Normal pulses.     Heart sounds: Normal heart sounds.  Pulmonary:     Effort: Pulmonary effort is normal.     Breath sounds: Normal breath sounds. No wheezing, rhonchi or rales.  Lymphadenopathy:     Head:     Right side of head: No submental, submandibular, tonsillar, preauricular, posterior auricular or occipital adenopathy.     Left side of head: No submental, submandibular, tonsillar, preauricular, posterior auricular or occipital adenopathy.     Cervical:  No cervical adenopathy.  Skin:    General: Skin is warm and dry.  Neurological:     Mental Status: She is alert.  Psychiatric:        Speech: Speech normal.        Behavior: Behavior normal.        Thought Content: Thought content normal.

## 2023-03-21 NOTE — Patient Instructions (Signed)
Start Z-Pak. Ensure to take probiotics while on antibiotics and also for 2 weeks after completion. This can either be by eating yogurt daily or taking a probiotic supplement over the counter such as Culturelle.It is important to re-colonize the gut with good bacteria and also to prevent any diarrheal infections associated with antibiotic use.

## 2023-03-22 ENCOUNTER — Other Ambulatory Visit: Payer: Self-pay | Admitting: Family

## 2023-03-22 DIAGNOSIS — J40 Bronchitis, not specified as acute or chronic: Secondary | ICD-10-CM

## 2023-03-22 MED ORDER — AZITHROMYCIN 250 MG PO TABS
ORAL_TABLET | ORAL | 0 refills | Status: AC
Start: 2023-03-22 — End: 2023-03-27

## 2023-03-30 ENCOUNTER — Other Ambulatory Visit: Payer: Self-pay | Admitting: Family

## 2023-04-07 ENCOUNTER — Ambulatory Visit: Payer: Medicare HMO | Admitting: Dermatology

## 2023-04-12 ENCOUNTER — Telehealth: Payer: Self-pay | Admitting: Family

## 2023-04-12 NOTE — Telephone Encounter (Signed)
Copied from CRM [id number]. Topic: Medicare AWV >> Apr 12, 2023  1:34 PM Payton Doughty wrote: Reason for CRM: LVM 04/11/22 AWV appt change to 04/26/23 @ 10am. Please confirm [facility]   Verlee Rossetti; Care Guide Ambulatory Clinical Support Benicia l [facility] Direct Dial: [phone]

## 2023-04-18 ENCOUNTER — Ambulatory Visit (INDEPENDENT_AMBULATORY_CARE_PROVIDER_SITE_OTHER): Payer: Medicare HMO

## 2023-04-18 DIAGNOSIS — E538 Deficiency of other specified B group vitamins: Secondary | ICD-10-CM | POA: Diagnosis not present

## 2023-04-18 MED ORDER — CYANOCOBALAMIN 1000 MCG/ML IJ SOLN
1000.0000 ug | Freq: Once | INTRAMUSCULAR | Status: AC
Start: 2023-04-18 — End: 2023-04-18
  Administered 2023-04-18: 1000 ug via INTRAMUSCULAR

## 2023-04-18 NOTE — Progress Notes (Signed)
Pt presented for their vitamin B12 injection. Pt was identified through two identifiers. Pt tolerated shot well in their right deltoid.  

## 2023-04-26 ENCOUNTER — Ambulatory Visit: Payer: Medicare HMO

## 2023-04-27 ENCOUNTER — Ambulatory Visit: Payer: Medicare HMO

## 2023-05-07 ENCOUNTER — Other Ambulatory Visit: Payer: Self-pay | Admitting: Family

## 2023-05-09 ENCOUNTER — Ambulatory Visit
Admission: RE | Admit: 2023-05-09 | Discharge: 2023-05-09 | Disposition: A | Discharge status: Home / Self Care | Payer: Medicare HMO | Source: Ambulatory Visit | Attending: Family | Admitting: Family

## 2023-05-09 ENCOUNTER — Telehealth: Payer: Self-pay | Admitting: Family

## 2023-05-09 DIAGNOSIS — Z1231 Encounter for screening mammogram for malignant neoplasm of breast: Secondary | ICD-10-CM | POA: Diagnosis present

## 2023-05-09 DIAGNOSIS — E119 Type 2 diabetes mellitus without complications: Secondary | ICD-10-CM

## 2023-05-09 DIAGNOSIS — I10 Essential (primary) hypertension: Secondary | ICD-10-CM

## 2023-05-09 MED ORDER — LISINOPRIL 5 MG PO TABS
5.0000 mg | ORAL_TABLET | Freq: Every day | ORAL | 1 refills | Status: DC
Start: 2023-05-09 — End: 2023-08-04

## 2023-05-09 MED ORDER — ATORVASTATIN CALCIUM 40 MG PO TABS: 40.0000 mg | ORAL_TABLET | Freq: Every day | ORAL | 1 refills | Status: DC

## 2023-05-09 NOTE — Addendum Note (Signed)
Addended by: [place], Antrone Walla on: 05/09/2023 03:22 PM   Modules accepted: Orders

## 2023-05-09 NOTE — Telephone Encounter (Signed)
Prescription Request  05/09/2023  LOV: 03/21/2023  What is the name of the medication or equipment? lisinopril (ZESTRIL) 5 MG tablet and atorvastatin (LIPITOR) 40 MG tablet     Have you contacted your pharmacy to request a refill? Yes   Which pharmacy would you like this sent to?  [id number] [place], [place] - [address] [place] [address] Phone: [phone] Fax: [phone]     Patient notified that their request is being sent to the clinical staff for review and that they should receive a response within 2 business days.   Please advise at Mobile [phone] (mobile)

## 2023-05-15 ENCOUNTER — Other Ambulatory Visit: Payer: Self-pay | Admitting: Family

## 2023-05-19 ENCOUNTER — Ambulatory Visit (INDEPENDENT_AMBULATORY_CARE_PROVIDER_SITE_OTHER): Payer: Medicare HMO

## 2023-05-19 DIAGNOSIS — E538 Deficiency of other specified B group vitamins: Secondary | ICD-10-CM

## 2023-05-19 MED ORDER — CYANOCOBALAMIN 1000 MCG/ML IJ SOLN
1000.0000 ug | Freq: Once | INTRAMUSCULAR | Status: AC
Start: 2023-05-19 — End: 2023-05-19
  Administered 2023-05-19: 1000 ug via INTRAMUSCULAR

## 2023-05-19 NOTE — Progress Notes (Signed)
After obtaining consent, and per orders of Dr. Walsh, injection of B12 given IM in left deltoid by Kela Baccari Lynn. Patient tolerated injection well.  

## 2023-05-25 ENCOUNTER — Ambulatory Visit (INDEPENDENT_AMBULATORY_CARE_PROVIDER_SITE_OTHER): Payer: Medicare HMO | Admitting: *Deleted

## 2023-05-25 VITALS — Ht 72.0 in | Wt 172.0 lb

## 2023-05-25 DIAGNOSIS — Z0000 Encounter for general adult medical examination without abnormal findings: Secondary | ICD-10-CM

## 2023-05-25 LAB — HM DIABETES EYE EXAM

## 2023-05-25 NOTE — Progress Notes (Signed)
Subjective:   Danielle Dickerson is a 67 y.o. female who presents for an Initial Medicare Annual Wellness Visit.  Visit Complete: Virtual  I connected with  Danielle Dickerson on 05/25/23 by a audio enabled telemedicine application and verified that I am speaking with the correct person using two identifiers.  Patient Location: Home  Provider Location: Office/Clinic  I discussed the limitations of evaluation and management by telemedicine. The patient expressed understanding and agreed to proceed.  Patient Medicare AWV questionnaire was completed by the patient on 05/25/23; I have confirmed that all information answered by patient is correct and no changes since this date.  Review of Systems     Cardiac Risk Factors include: advanced age (>89men, >14 women);diabetes mellitus;dyslipidemia;hypertension     Objective:    Today's Vitals   05/25/23 1134  Weight: 172 lb (78 kg)  Height: 6' (1.829 m)   Body mass index is 23.33 kg/m.     05/25/2023   11:52 AM 10/07/2021    6:58 AM 09/21/2021    8:00 AM 09/17/2021   12:16 PM 09/07/2021    6:00 PM 09/07/2021    1:19 PM 05/16/2021   11:01 AM  Advanced Directives  Does Patient Have a Medical Advance Directive? Yes No Yes No No No No  Type of Estate agent of [place];Living will Living will;Healthcare Power of Attorney Living will      Copy of Healthcare Power of Attorney in Chart? No - copy requested        Would patient like information on creating a medical advance directive?  No - Patient declined  No - Patient declined No - Patient declined      Current Medications (verified) Outpatient Encounter Medications as of 05/25/2023  Medication Sig   Accu-Chek FastClix Lancets MISC Used to check blood sugars twice a day.   aspirin EC 81 MG tablet Take 81 mg by mouth daily.   atorvastatin (LIPITOR) 40 MG tablet Take 1 tablet (40 mg total) by mouth daily.   Blood Glucose Monitoring Suppl (ACCU-CHEK GUIDE ME) w/Device  KIT USE AS DIRECTED   Calcium-Vitamin D-Vitamin K (VIACTIV CALCIUM PLUS D PO) Take 1 tablet by mouth daily.   Cholecalciferol (VITAMIN D3 PO) Take 400 mg by mouth daily.   citalopram (CELEXA) 20 MG tablet TAKE 1 TABLET BY MOUTH EVERY DAY   famotidine (PEPCID) 20 MG tablet Take 20 mg by mouth at bedtime.   ferrous sulfate 325 (65 FE) MG EC tablet Take 325 mg by mouth 3 (three) times daily with meals.   glucose blood (ACCU-CHEK AVIVA PLUS) test strip Used to check blood sugars twice a day.   Insulin Pen Needle (PEN NEEDLES) 32G X 4 MM MISC Used to give Victoza injections.   Lancets (ONETOUCH ULTRASOFT) lancets Used to check blood sugars once a day.   lisinopril (ZESTRIL) 5 MG tablet Take 1 tablet (5 mg total) by mouth daily.   metFORMIN (GLUCOPHAGE) 1000 MG tablet TAKE 1 TABLET (1,000 MG TOTAL) BY MOUTH TWICE A DAY WITH FOOD   Multiple Vitamins-Minerals (CENTRUM SILVER ADULT 50+ PO) Take 1 tablet by mouth daily.   ondansetron (ZOFRAN ODT) 4 MG disintegrating tablet Take 1 tablet (4 mg total) by mouth every 8 (eight) hours as needed.   pantoprazole (PROTONIX) 20 MG tablet TAKE 1 TABLET (20 MG TOTAL) BY MOUTH 2 (TWO) TIMES DAILY BEFORE A MEAL.   Semaglutide, 1 MG/DOSE, (OZEMPIC, 1 MG/DOSE,) 4 MG/3ML SOPN INJECT 1 MG INTO THE SKIN  ONE TIME PER WEEK   ipratropium (ATROVENT) 0.03 % nasal spray Place 2 sprays into both nostrils every 12 (twelve) hours. (Patient not taking: Reported on 05/25/2023)   meloxicam (MOBIC) 7.5 MG tablet Take 1 tablet (7.5 mg total) by mouth daily as needed for pain. (Patient not taking: Reported on 05/25/2023)   promethazine-dextromethorphan (PROMETHAZINE-DM) 6.25-15 MG/5ML syrup Take 5 mLs by mouth at bedtime as needed for cough. (Patient not taking: Reported on 05/25/2023)   No facility-administered encounter medications on file as of 05/25/2023.    Allergies (verified) Pneumovax 23 [pneumococcal vac polyvalent] and Prevnar 13 [pneumococcal 13-val conj vacc]   History: Past  Medical History:  Diagnosis Date   COVID-19    05/27/21   Depression    Diabetes mellitus without complication (HCC)    Type II   Eosinophilic esophagitis    Esophageal stricture 2019   GERD (gastroesophageal reflux disease)    Hyperlipidemia    Pernicious anemia    Past Surgical History:  Procedure Laterality Date   BREAST EXCISIONAL BIOPSY Right 2009   phyllodes removed    BREAST SURGERY     CESAREAN SECTION     CHOLECYSTECTOMY OPEN     COLONOSCOPY WITH PROPOFOL N/A 02/02/2021   Procedure: COLONOSCOPY WITH PROPOFOL;  Surgeon: Toney Reil, MD;  Location: [facility] ENDOSCOPY;  Service: Gastroenterology;  Laterality: N/A;   CYSTOSCOPY WITH STENT PLACEMENT Right 09/07/2021   Procedure: CYSTOSCOPY WITH STENT PLACEMENT; WITH RETROGRADE PYELOGRAM;  Surgeon: [name], MD;  Location: [facility] ORS;  Service: Urology;  Laterality: Right;   CYSTOSCOPY/RETROGRADE/URETEROSCOPY/STONE EXTRACTION WITH BASKET  09/21/2021   Procedure: CYSTOSCOPY/RETROGRADE/URETEROSCOPY/STONE EXTRACTION WITH BASKET;  Surgeon: [name], MD;  Location: [facility] ORS;  Service: Urology;;   ESOPHAGOGASTRODUODENOSCOPY (EGD) WITH PROPOFOL N/A 10/07/2021   Procedure: ESOPHAGOGASTRODUODENOSCOPY (EGD) WITH PROPOFOL;  Surgeon: Toney Reil, MD;  Location[facility] END[facility]COPY;  Service: Gastroenterology;  Laterality: N/A;   SUPRACERVICAL ABDOMINAL HYSTERECTOMY  2006   has ovaries. Had heavy periods and was anemic. HAS cervix   TONSILLECTOMY  1966   Family History  Problem Relation Age of Onset   Congestive Heart Failure Mother    CVA Mother 65   Colon cancer Father    Esophageal cancer Maternal Uncle    Thyroid cancer Neg Hx    Social History   Socioeconomic History   Marital status: Married    Spouse name: Not on file   Number of children: Not on file   Years of education: Not on file   Highest education level: Associate degree: occupational, Scientist, product/process development, or vocational program  Occupational History   Not  on file  Tobacco Use   Smoking status: Never   Smokeless tobacco: Never  Vaping Use   Vaping Use: Never used  Substance and Sexual Activity   Alcohol use: Not Currently   Drug use: Never   Sexual activity: Yes    Partners: Male  Other Topics Concern   Not on file  Social History Narrative   Moved from [place] 07/2019      Married   Husband retired   2 daughters that live in [place]      Homeschooling 6 yo grandson, Scientist, clinical (histocompatibility and immunogenetics)   Social Determinants of Health   Financial Resource Strain: Low Risk  (05/25/2023)   Overall Financial Resource Strain (CARDIA)    Difficulty of Paying Living Expenses: Not hard at all  Food Insecurity: No Food Insecurity (05/25/2023)   Hunger Vital Sign    Worried About Running Out of Food in the  Last Year: Never true    Ran Out of Food in the Last Year: Never true  Transportation Needs: No Transportation Needs (05/25/2023)   PRAPARE - Administrator, Civil Service (Medical): No    Lack of Transportation (Non-Medical): No  Physical Activity: Sufficiently Active (05/25/2023)   Exercise Vital Sign    Days of Exercise per Week: 7 days    Minutes of Exercise per Session: 60 min  Stress: No Stress Concern Present (05/25/2023)   [employer] of Occupational Health - Occupational Stress Questionnaire    Feeling of Stress : Only a little  Social Connections: Moderately Isolated (05/25/2023)   Social Connection and Isolation Panel [NHANES]    Frequency of Communication with Friends and Family: More than three times a week    Frequency of Social Gatherings with Friends and Family: More than three times a week    Attends Religious Services: Never    Database administrator or Organizations: No    Attends Engineer, structural: Not on file    Marital Status: Married    Tobacco Counseling Counseling given: Not Answered   Clinical Intake:  Pre-visit preparation completed: Yes  Pain : No/denies pain     BMI - recorded:  23.33 Nutritional Status: BMI of 19-24  Normal Nutritional Risks: None Diabetes: Yes CBG done?: No Did pt. bring in CBG monitor from home?: No  How often do you need to have someone help you when you read instructions, pamphlets, or other written materials from your doctor or pharmacy?: 1 - Never  Interpreter Needed?: No  Information entered by :: R. Chastin Riesgo LPN   Activities of Daily Living    05/25/2023   11:38 AM 05/25/2023    7:38 AM  In your present state of health, do you have any difficulty performing the following activities:  Hearing? 1 0  Comment at times   Vision? 0 0  Difficulty concentrating or making decisions? 0 0  Walking or climbing stairs? 0 0  Dressing or bathing? 0 0  Doing errands, shopping? 0 0  Preparing Food and eating ? N N  Using the Toilet? N N  In the past six months, have you accidently leaked urine? N N  Do you have problems with loss of bowel control? N N  Managing your Medications? N N  Managing your Finances? N N  Housekeeping or managing your Housekeeping? N N    Patient Care Team: Allegra Grana, FNP as PCP - General (Family Medicine) Debbe Odea, MD as PCP - Cardiology (Cardiology)  Indicate any recent Medical Services you may have received from other than [facility] providers in the past year (date may be approximate).     Assessment:   This is a routine wellness examination for Hilda.  Hearing/Vision screen Hearing Screening - Comments:: At times Vision Screening - Comments:: glasses  Dietary issues and exercise activities discussed:     Goals Addressed             This Visit's Progress    Patient Stated       Wants to make sure that she continues to stay active. May take up pickle ball again.      Depression Screen    05/25/2023   11:57 AM 03/21/2023   11:07 AM 03/07/2023    8:54 AM 12/06/2022    9:47 AM 10/29/2022   10:30 AM 05/28/2022    2:29 PM 03/26/2022    8:53 AM  PHQ 2/9 Scores  PHQ - 2 Score 0 0 0 0 0 0  0  PHQ- 9 Score 0 1 0 1   0    Fall Risk    05/25/2023   11:56 AM 05/25/2023    7:38 AM 03/21/2023   11:07 AM 03/07/2023    8:54 AM 12/06/2022    9:47 AM  Fall Risk   Falls in the past year? 0 0 0 0 0  Number falls in past yr: 0  0 0 0  Injury with Fall? 0  0 0 0  Risk for fall due to : No Fall Risks  No Fall Risks No Fall Risks No Fall Risks  Follow up Falls prevention discussed;Falls evaluation completed;Education provided  Falls evaluation completed Falls evaluation completed Falls evaluation completed    MEDICARE RISK AT HOME:    Cognitive Function:    03/26/2022    9:00 AM  MMSE - Mini Mental State Exam  Orientation to time 5  Orientation to Place 5  Registration 3  Attention/ Calculation 5  Recall 3  Language- name 2 objects 2  Language- repeat 1  Language- follow 3 step command 3  Language- read & follow direction 1  Write a sentence 1  Copy design 1  Total score 30        05/25/2023   11:53 AM  6CIT Screen  What Year? 0 points  What month? 0 points  What time? 0 points  Count back from 20 0 points  Months in reverse 0 points  Repeat phrase 0 points  Total Score 0 points    Immunizations Immunization History  Administered Date(s) Administered   Influenza,inj,Quad PF,6+ Mos 08/08/2020   Influenza-Unspecified 09/10/2019, 08/31/2021, 08/20/2022   PFIZER Comirnaty(Gray Top)Covid-19 Tri-Sucrose Vaccine 03/07/2020, 03/28/2020, 09/04/2020, 03/02/2021   PFIZER(Purple Top)SARS-COV-2 Vaccination 03/07/2020, 03/28/2020, 09/04/2020   Pfizer Covid-19 Vaccine Bivalent Booster 44yrs & up 08/31/2021   Pneumococcal Polysaccharide-23 05/06/2020, 05/15/2021   Tdap 05/06/2020   Zoster Recombinat (Shingrix) 04/20/2016, 07/21/2016    TDAP status: Up to date  Flu Vaccine status: Up to date  Pneumococcal vaccine status: Up to date  Covid-19 vaccine status: Completed vaccines  Qualifies for Shingles Vaccine? Yes  patient states had done when turned 60 Zostavax  completed Yes   Shingrix Completed?: Yes  Screening Tests Health Maintenance  Topic Date Due   FOOT EXAM  11/11/2021   COVID-19 Vaccine (9 - 2023-24 season) 07/30/2022   Diabetic kidney evaluation - Urine ACR  04/24/2023   HEMOGLOBIN A1C  04/30/2023   OPHTHALMOLOGY EXAM  05/20/2023   INFLUENZA VACCINE  06/30/2023   Diabetic kidney evaluation - eGFR measurement  03/06/2024   MAMMOGRAM  05/08/2024   Medicare Annual Wellness (AWV)  05/24/2024   Colonoscopy  02/02/2026   DTaP/Tdap/Td (2 - Td or Tdap) 05/06/2030   Hepatitis C Screening  Completed   Zoster Vaccines- Shingrix  Completed   HPV VACCINES  Aged Out   Pneumonia Vaccine 19+ Years old  Discontinued   DEXA SCAN  Discontinued    Health Maintenance  Health Maintenance Due  Topic Date Due   FOOT EXAM  11/11/2021   COVID-19 Vaccine (9 - 2023-24 season) 07/30/2022   Diabetic kidney evaluation - Urine ACR  04/24/2023   HEMOGLOBIN A1C  04/30/2023   OPHTHALMOLOGY EXAM  05/20/2023    Colorectal cancer screening: Type of screening: Colonoscopy. Completed 02/02/21. Repeat every 5 years  Mammogram status: Completed 05/09/23. Repeat every year  Bone Density status: Completed 05/04/22. Results reflect: Bone density  results: NORMAL. Repeat every 2 years.  Lung Cancer Screening: (Low Dose CT Chest recommended if Age 72-80 years, 20 pack-year currently smoking OR have quit w/in 15years.) does not qualify.     Additional Screening:  Hepatitis C Screening: does qualify; Completed 02/27/20  Vision Screening: Recommended annual ophthalmology exams for early detection of glaucoma and other disorders of the eye. Is the patient up to date with their annual eye exam?  Yes  Who is the provider or what is the name of the office in which the patient attends annual eye exams? [facility] If pt is not established with a provider, would they like to be referred to a provider to establish care? No .   Dental Screening: Recommended annual  dental exams for proper oral hygiene  Diabetic Foot Exam: Diabetic Foot Exam: Overdue, Pt has been advised about the importance in completing this exam. Pt is scheduled for diabetic foot exam on 11/11/20.  Community Resource Referral / Chronic Care Management: CRR required this visit?  No   CCM required this visit?  No     Plan:     I have personally reviewed and noted the following in the patient's chart:   Medical and social history Use of alcohol, tobacco or illicit drugs  Current medications and supplements including opioid prescriptions. Patient is not currently taking opioid prescriptions. Functional ability and status Nutritional status Physical activity Advanced directives List of other physicians Hospitalizations, surgeries, and ER visits in previous 12 months Vitals Screenings to include cognitive, depression, and falls Referrals and appointments  In addition, I have reviewed and discussed with patient certain preventive protocols, quality metrics, and best practice recommendations. A written personalized care plan for preventive services as well as general preventive health recommendations were provided to patient.     Sydell Axon, LPN   [id number]   After Visit Summary: (MyChart) Due to this being a telephonic visit, the after visit summary with patients personalized plan was offered to patient via MyChart   Nurse Notes: Patient states that she loves her PCP here and so very happy with her.

## 2023-05-25 NOTE — Patient Instructions (Signed)
Danielle Dickerson , Thank you for taking time to come for your Medicare Wellness Visit. I appreciate your ongoing commitment to your health goals. Please review the following plan we discussed and let me know if I can assist you in the future.   These are the goals we discussed:  Goals      Patient Stated     Wants to make sure that she continues to stay active. May take up pickle ball again.        This is a list of the screening recommended for you and due dates:  Health Maintenance  Topic Date Due   Complete foot exam   11/11/2021   COVID-19 Vaccine (9 - 2023-24 season) 07/30/2022   Yearly kidney health urinalysis for diabetes  04/24/2023   Hemoglobin A1C  04/30/2023   Eye exam for diabetics  05/20/2023   Flu Shot  06/30/2023   Yearly kidney function blood test for diabetes  03/06/2024   Mammogram  05/08/2024   Medicare Annual Wellness Visit  05/24/2024   Colon Cancer Screening  02/02/2026   DTaP/Tdap/Td vaccine (2 - Td or Tdap) 05/06/2030   Hepatitis C Screening  Completed   Zoster (Shingles) Vaccine  Completed   HPV Vaccine  Aged Out   Pneumonia Vaccine  Discontinued   DEXA scan (bone density measurement)  Discontinued    Advanced directives: will bring to the office  Conditions/risks identified:   Next appointment: Follow up in one year for your annual wellness visit 05/29/24 @ 11:00   Preventive Care 65 Years and Older, Female Preventive care refers to lifestyle choices and visits with your health care provider that can promote health and wellness. What does preventive care include? A yearly physical exam. This is also called an annual well check. Dental exams once or twice a year. Routine eye exams. Ask your health care provider how often you should have your eyes checked. Personal lifestyle choices, including: Daily care of your teeth and gums. Regular physical activity. Eating a healthy diet. Avoiding tobacco and drug use. Limiting alcohol use. Practicing safe  sex. Taking low-dose aspirin every day. Taking vitamin and mineral supplements as recommended by your health care provider. What happens during an annual well check? The services and screenings done by your health care provider during your annual well check will depend on your age, overall health, lifestyle risk factors, and family history of disease. Counseling  Your health care provider may ask you questions about your: Alcohol use. Tobacco use. Drug use. Emotional well-being. Home and relationship well-being. Sexual activity. Eating habits. History of falls. Memory and ability to understand (cognition). Work and work Astronomer. Reproductive health. Screening  You may have the following tests or measurements: Height, weight, and BMI. Blood pressure. Lipid and cholesterol levels. These may be checked every 5 years, or more frequently if you are over 78 years old. Skin check. Lung cancer screening. You may have this screening every year starting at age 35 if you have a 30-pack-year history of smoking and currently smoke or have quit within the past 15 years. Fecal occult blood test (FOBT) of the stool. You may have this test every year starting at age 37. Flexible sigmoidoscopy or colonoscopy. You may have a sigmoidoscopy every 5 years or a colonoscopy every 10 years starting at age 33. Hepatitis C blood test. Hepatitis B blood test. Sexually transmitted disease (STD) testing. Diabetes screening. This is done by checking your blood sugar (glucose) after you have not eaten for a  while (fasting). You may have this done every 1-3 years. Bone density scan. This is done to screen for osteoporosis. You may have this done starting at age 52. Mammogram. This may be done every 1-2 years. Talk to your health care provider about how often you should have regular mammograms. Talk with your health care provider about your test results, treatment options, and if necessary, the need for more  tests. Vaccines  Your health care provider may recommend certain vaccines, such as: Influenza vaccine. This is recommended every year. Tetanus, diphtheria, and acellular pertussis (Tdap, Td) vaccine. You may need a Td booster every 10 years. Zoster vaccine. You may need this after age 72. Pneumococcal 13-valent conjugate (PCV13) vaccine. One dose is recommended after age 34. Pneumococcal polysaccharide (PPSV23) vaccine. One dose is recommended after age 12. Talk to your health care provider about which screenings and vaccines you need and how often you need them. This information is not intended to replace advice given to you by your health care provider. Make sure you discuss any questions you have with your health care provider. Document Released: 12/12/2015 Document Revised: 08/04/2016 Document Reviewed: 09/16/2015 Elsevier Interactive Patient Education  2017 [employer] Prevention in the Home Falls can cause injuries. They can happen to people of all ages. There are many things you can do to make your home safe and to help prevent falls. What can I do on the outside of my home? Regularly fix the edges of walkways and driveways and fix any cracks. Remove anything that might make you trip as you walk through a door, such as a raised step or threshold. Trim any bushes or trees on the path to your home. Use bright outdoor lighting. Clear any walking paths of anything that might make someone trip, such as rocks or tools. Regularly check to see if handrails are loose or broken. Make sure that both sides of any steps have handrails. Any raised decks and porches should have guardrails on the edges. Have any leaves, snow, or ice cleared regularly. Use sand or salt on walking paths during winter. Clean up any spills in your garage right away. This includes oil or grease spills. What can I do in the bathroom? Use night lights. Install grab bars by the toilet and in the tub and shower.  Do not use towel bars as grab bars. Use non-skid mats or decals in the tub or shower. If you need to sit down in the shower, use a plastic, non-slip stool. Keep the floor dry. Clean up any water that spills on the floor as soon as it happens. Remove soap buildup in the tub or shower regularly. Attach bath mats securely with double-sided non-slip rug tape. Do not have throw rugs and other things on the floor that can make you trip. What can I do in the bedroom? Use night lights. Make sure that you have a light by your bed that is easy to reach. Do not use any sheets or blankets that are too big for your bed. They should not hang down onto the floor. Have a firm chair that has side arms. You can use this for support while you get dressed. Do not have throw rugs and other things on the floor that can make you trip. What can I do in the kitchen? Clean up any spills right away. Avoid walking on wet floors. Keep items that you use a lot in easy-to-reach places. If you need to reach something above you,  use a strong step stool that has a grab bar. Keep electrical cords out of the way. Do not use floor polish or wax that makes floors slippery. If you must use wax, use non-skid floor wax. Do not have throw rugs and other things on the floor that can make you trip. What can I do with my stairs? Do not leave any items on the stairs. Make sure that there are handrails on both sides of the stairs and use them. Fix handrails that are broken or loose. Make sure that handrails are as long as the stairways. Check any carpeting to make sure that it is firmly attached to the stairs. Fix any carpet that is loose or worn. Avoid having throw rugs at the top or bottom of the stairs. If you do have throw rugs, attach them to the floor with carpet tape. Make sure that you have a light switch at the top of the stairs and the bottom of the stairs. If you do not have them, ask someone to add them for you. What else  can I do to help prevent falls? Wear shoes that: Do not have high heels. Have rubber bottoms. Are comfortable and fit you well. Are closed at the toe. Do not wear sandals. If you use a stepladder: Make sure that it is fully opened. Do not climb a closed stepladder. Make sure that both sides of the stepladder are locked into place. Ask someone to hold it for you, if possible. Clearly mark and make sure that you can see: Any grab bars or handrails. First and last steps. Where the edge of each step is. Use tools that help you move around (mobility aids) if they are needed. These include: Canes. Walkers. Scooters. Crutches. Turn on the lights when you go into a dark area. Replace any light bulbs as soon as they burn out. Set up your furniture so you have a clear path. Avoid moving your furniture around. If any of your floors are uneven, fix them. If there are any pets around you, be aware of where they are. Review your medicines with your doctor. Some medicines can make you feel dizzy. This can increase your chance of falling. Ask your doctor what other things that you can do to help prevent falls. This information is not intended to replace advice given to you by your health care provider. Make sure you discuss any questions you have with your health care provider. Document Released: 09/11/2009 Document Revised: 04/22/2016 Document Reviewed: 12/20/2014 Elsevier Interactive Patient Education  2017 [employer].

## 2023-06-09 ENCOUNTER — Other Ambulatory Visit: Payer: Self-pay | Admitting: Family

## 2023-06-20 ENCOUNTER — Ambulatory Visit: Payer: Medicare HMO | Admitting: *Deleted

## 2023-06-20 DIAGNOSIS — E538 Deficiency of other specified B group vitamins: Secondary | ICD-10-CM

## 2023-06-20 MED ORDER — CYANOCOBALAMIN 1000 MCG/ML IJ SOLN
1000.0000 ug | Freq: Once | INTRAMUSCULAR | Status: AC
Start: 2023-06-20 — End: 2023-06-20
  Administered 2023-06-20: 1000 ug via INTRAMUSCULAR

## 2023-06-20 NOTE — Progress Notes (Signed)
Pt received B12 in right deltoid. Pt tolerated it well with no concerns or complaints

## 2023-06-29 ENCOUNTER — Encounter (INDEPENDENT_AMBULATORY_CARE_PROVIDER_SITE_OTHER): Payer: Self-pay

## 2023-07-21 ENCOUNTER — Ambulatory Visit (INDEPENDENT_AMBULATORY_CARE_PROVIDER_SITE_OTHER): Payer: Medicare HMO

## 2023-07-21 DIAGNOSIS — E538 Deficiency of other specified B group vitamins: Secondary | ICD-10-CM | POA: Diagnosis not present

## 2023-07-21 MED ORDER — CYANOCOBALAMIN 1000 MCG/ML IJ SOLN
1000.0000 ug | Freq: Once | INTRAMUSCULAR | Status: AC
Start: 2023-07-21 — End: 2023-07-21
  Administered 2023-07-21: 1000 ug via INTRAMUSCULAR

## 2023-07-21 NOTE — Progress Notes (Addendum)
Patient arrived for a B12 injection and it was administered into her left deltoid. Patient tolerated the injection well and did not show any signs of distress or voice any concerns. 

## 2023-07-30 ENCOUNTER — Other Ambulatory Visit: Payer: Self-pay | Admitting: Family

## 2023-07-30 DIAGNOSIS — Z0000 Encounter for general adult medical examination without abnormal findings: Secondary | ICD-10-CM

## 2023-08-03 ENCOUNTER — Encounter: Payer: Self-pay | Admitting: Family

## 2023-08-04 ENCOUNTER — Other Ambulatory Visit: Payer: Self-pay | Admitting: Family

## 2023-08-04 ENCOUNTER — Ambulatory Visit (INDEPENDENT_AMBULATORY_CARE_PROVIDER_SITE_OTHER): Payer: Medicare HMO | Admitting: Nurse Practitioner

## 2023-08-04 ENCOUNTER — Encounter: Payer: Self-pay | Admitting: Nurse Practitioner

## 2023-08-04 VITALS — BP 120/76 | HR 70 | Temp 98.0°F | Ht 72.0 in | Wt 173.2 lb

## 2023-08-04 DIAGNOSIS — Z23 Encounter for immunization: Secondary | ICD-10-CM

## 2023-08-04 DIAGNOSIS — I10 Essential (primary) hypertension: Secondary | ICD-10-CM

## 2023-08-04 DIAGNOSIS — E119 Type 2 diabetes mellitus without complications: Secondary | ICD-10-CM

## 2023-08-04 DIAGNOSIS — T148XXA Other injury of unspecified body region, initial encounter: Secondary | ICD-10-CM | POA: Diagnosis not present

## 2023-08-04 MED ORDER — MUPIROCIN 2 % EX OINT: 1.0000 | TOPICAL_OINTMENT | Freq: Two times a day (BID) | CUTANEOUS | 1 refills | Status: DC

## 2023-08-04 NOTE — Patient Instructions (Addendum)
Dip the toe in warm Epsom salt water and apply Bactroban ointment 2-3 times a day. Use blister pad to avoid friction. Let us know if it is not getting better.

## 2023-08-04 NOTE — Assessment & Plan Note (Addendum)
Advised patient to dip the toe in warm Epsom salt water and apply Bactroban ointment 2-3 times a day. Advised to use blister pad to avoid friction. Patient would let us know if the blister is not getting better. Advised patient to use Bactroban ointment.

## 2023-08-04 NOTE — Progress Notes (Signed)
Established Patient Office Visit  Subjective:  Patient ID: Danielle Dickerson, female    DOB: [birth year]  Age: 66 y.o. MRN: [id number]  CC:  Chief Complaint  Patient presents with   Acute Visit    Left big toe blister with a little discolor    HPI  Danielle Dickerson presents for blister that she noticed past weekend.  She had pedicure on 07/27/23 and noticed a blister after 2 days of pedicure.  Patient states that she is wearing the same shoes and has been doing the same kind of exercise.  She denies any numbness or tingling or pain in the area.   HPI   Past Medical History:  Diagnosis Date   COVID-19    05/27/21   Depression    Diabetes mellitus without complication (HCC)    Type II   Eosinophilic esophagitis    Esophageal stricture 2019   GERD (gastroesophageal reflux disease)    Hyperlipidemia    Pernicious anemia     Past Surgical History:  Procedure Laterality Date   BREAST EXCISIONAL BIOPSY Right 2009   phyllodes removed    BREAST SURGERY     CESAREAN SECTION     CHOLECYSTECTOMY OPEN     COLONOSCOPY WITH PROPOFOL N/A 02/02/2021   Procedure: COLONOSCOPY WITH PROPOFOL;  Surgeon: Toney Reil, MD;  Location: [facility] ENDOSCOPY;  Service: Gastroenterology;  Laterality: N/A;   CYSTOSCOPY WITH STENT PLACEMENT Right 09/07/2021   Procedure: CYSTOSCOPY WITH STENT PLACEMENT; WITH RETROGRADE PYELOGRAM;  Surgeon: [name], MD;  Location: [facility] ORS;  Service: Urology;  Laterality: Right;   CYSTOSCOPY/RETROGRADE/URETEROSCOPY/STONE EXTRACTION WITH BASKET  09/21/2021   Procedure: CYSTOSCOPY/RETROGRADE/URETEROSCOPY/STONE EXTRACTION WITH BASKET;  Surgeon: [name], MD;  Location: [facility] ORS;  Service: Urology;;   ESOPHAGOGASTRODUODENOSCOPY (EGD) WITH PROPOFOL N/A 10/07/2021   Procedure: ESOPHAGOGASTRODUODENOSCOPY (EGD) WITH PROPOFOL;  Surgeon: Toney Reil, MD;  Location: [facility] ENDOSCOPY;  Service: Gastroenterology;  Laterality: N/A;   SUPRACERVICAL ABDOMINAL  HYSTERECTOMY  2006   has ovaries. Had heavy periods and was anemic. HAS cervix   TONSILLECTOMY  1966    Family History  Problem Relation Age of Onset   Congestive Heart Failure Mother    CVA Mother 66   Colon cancer Father    Esophageal cancer Maternal Uncle    Thyroid cancer Neg Hx     Social History   Socioeconomic History   Marital status: Married    Spouse name: Not on file   Number of children: Not on file   Years of education: Not on file   Highest education level: Associate degree: occupational, Scientist, product/process development, or vocational program  Occupational History   Not on file  Tobacco Use   Smoking status: Never   Smokeless tobacco: Never  Vaping Use   Vaping status: Never Used  Substance and Sexual Activity   Alcohol use: Not Currently   Drug use: Never   Sexual activity: Yes    Partners: Male  Other Topics Concern   Not on file  Social History Narrative   Moved from [place] 07/2019      Married   Husband retired   2 daughters that live in [place]      Homeschooling 6 yo grandson, Scientist, clinical (histocompatibility and immunogenetics)   Social Determinants of Health   Financial Resource Strain: Low Risk  (05/25/2023)   Overall Financial Resource Strain (CARDIA)    Difficulty of Paying Living Expenses: Not hard at all  Food Insecurity: No Food Insecurity (05/25/2023)   Hunger Vital Sign  Worried About Programme researcher, broadcasting/film/video in the Last Year: Never true    Ran Out of Food in the Last Year: Never true  Transportation Needs: No Transportation Needs (05/25/2023)   PRAPARE - Administrator, Civil Service (Medical): No    Lack of Transportation (Non-Medical): No  Physical Activity: Sufficiently Active (05/25/2023)   Exercise Vital Sign    Days of Exercise per Week: 7 days    Minutes of Exercise per Session: 60 min  Stress: No Stress Concern Present (05/25/2023)   [employer] of Occupational Health - Occupational Stress Questionnaire    Feeling of Stress : Only a little  Social Connections:  Moderately Isolated (05/25/2023)   Social Connection and Isolation Panel [NHANES]    Frequency of Communication with Friends and Family: More than three times a week    Frequency of Social Gatherings with Friends and Family: More than three times a week    Attends Religious Services: Never    Database administrator or Organizations: No    Attends Engineer, structural: Not on file    Marital Status: Married  Catering manager Violence: Not At Risk (05/25/2023)   Humiliation, Afraid, Rape, and Kick questionnaire    Fear of Current or Ex-Partner: No    Emotionally Abused: No    Physically Abused: No    Sexually Abused: No     Outpatient Medications Prior to Visit  Medication Sig Dispense Refill   Accu-Chek FastClix Lancets MISC Used to check blood sugars twice a day. 100 each 3   aspirin EC 81 MG tablet Take 81 mg by mouth daily.     atorvastatin (LIPITOR) 40 MG tablet Take 1 tablet (40 mg total) by mouth daily. 90 tablet 1   Blood Glucose Monitoring Suppl (ACCU-CHEK GUIDE ME) w/Device KIT USE AS DIRECTED 1 kit 0   Calcium-Vitamin D-Vitamin K (VIACTIV CALCIUM PLUS D PO) Take 1 tablet by mouth daily.     Cholecalciferol (VITAMIN D3 PO) Take 400 mg by mouth daily.     citalopram (CELEXA) 20 MG tablet TAKE 1 TABLET BY MOUTH EVERY DAY 90 tablet 2   famotidine (PEPCID) 20 MG tablet Take 20 mg by mouth at bedtime.     ferrous sulfate 325 (65 FE) MG EC tablet Take 325 mg by mouth 3 (three) times daily with meals.     glucose blood (ACCU-CHEK AVIVA PLUS) test strip Used to check blood sugars twice a day. 100 each 5   Insulin Pen Needle (PEN NEEDLES) 32G X 4 MM MISC Used to give Victoza injections. 100 each 1   ipratropium (ATROVENT) 0.03 % nasal spray Place 2 sprays into both nostrils every 12 (twelve) hours. 30 mL 12   Lancets (ONETOUCH ULTRASOFT) lancets Used to check blood sugars once a day. 100 each 4   meloxicam (MOBIC) 7.5 MG tablet Take 1 tablet (7.5 mg total) by mouth daily as  needed for pain. 30 tablet 1   metFORMIN (GLUCOPHAGE) 1000 MG tablet TAKE 1 TABLET (1,000 MG TOTAL) BY MOUTH TWICE A DAY WITH FOOD 180 tablet 1   Multiple Vitamins-Minerals (CENTRUM SILVER ADULT 50+ PO) Take 1 tablet by mouth daily.     ondansetron (ZOFRAN ODT) 4 MG disintegrating tablet Take 1 tablet (4 mg total) by mouth every 8 (eight) hours as needed. 30 tablet 1   pantoprazole (PROTONIX) 20 MG tablet TAKE 1 TABLET (20 MG TOTAL) BY MOUTH 2 (TWO) TIMES DAILY BEFORE A MEAL. 180 tablet 1  promethazine-dextromethorphan (PROMETHAZINE-DM) 6.25-15 MG/5ML syrup Take 5 mLs by mouth at bedtime as needed for cough. 45 mL 1   lisinopril (ZESTRIL) 5 MG tablet Take 1 tablet (5 mg total) by mouth daily. 90 tablet 1   Semaglutide, 1 MG/DOSE, (OZEMPIC, 1 MG/DOSE,) 4 MG/3ML SOPN INJECT 1 MG INTO THE SKIN ONE TIME PER WEEK 9 mL 0   No facility-administered medications prior to visit.    Allergies  Allergen Reactions   Pneumovax 23 [Pneumococcal Vac Polyvalent] Swelling    Causes arm swelling, redness.   Prevnar 13 [Pneumococcal 13-Val Conj Vacc] Swelling    ROS Review of Systems Negative unless indicated in HPI.    Objective:    Physical Exam Constitutional:      Appearance: Normal appearance.  Cardiovascular:     Rate and Rhythm: Normal rate and regular rhythm.     Pulses: Normal pulses.     Heart sounds: Normal heart sounds.  Abdominal:     Palpations: Abdomen is soft.  Musculoskeletal:     Cervical back: Normal range of motion.  Skin:    Comments: blister on the left medial nail fold big toe.  Neurological:     General: No focal deficit present.     Mental Status: She is alert. Mental status is at baseline.  Psychiatric:        Mood and Affect: Mood normal.        Behavior: Behavior normal.        Thought Content: Thought content normal.        Judgment: Judgment normal.     BP 120/76   Pulse 70   Temp 98 F (36.7 C)   Ht 6' (1.829 m)   Wt 173 lb 3.2 oz (78.6 kg)   SpO2  97%   BMI 23.49 kg/m  Wt Readings from Last 3 Encounters:  08/04/23 173 lb 3.2 oz (78.6 kg)  05/25/23 172 lb (78 kg)  03/21/23 172 lb 6.4 oz (78.2 kg)     Health Maintenance  Topic Date Due   FOOT EXAM  11/11/2021   Diabetic kidney evaluation - Urine ACR  04/24/2023   HEMOGLOBIN A1C  04/30/2023   COVID-19 Vaccine (9 - 2023-24 season) 07/31/2023   Diabetic kidney evaluation - eGFR measurement  03/06/2024   MAMMOGRAM  05/08/2024   OPHTHALMOLOGY EXAM  05/24/2024   Medicare Annual Wellness (AWV)  05/24/2024   Colonoscopy  02/02/2026   DTaP/Tdap/Td (2 - Td or Tdap) 05/06/2030   INFLUENZA VACCINE  Completed   Hepatitis C Screening  Completed   Zoster Vaccines- Shingrix  Completed   HPV VACCINES  Aged Out   Pneumonia Vaccine 60+ Years old  Discontinued   DEXA SCAN  Discontinued    There are no preventive care reminders to display for this patient.  Lab Results  Component Value Date   TSH 1.47 04/23/2022   Lab Results  Component Value Date   WBC 7.9 03/07/2023   HGB 11.8 (L) 03/07/2023   HCT 34.2 (L) 03/07/2023   MCV 86.0 03/07/2023   PLT 172.0 03/07/2023   Lab Results  Component Value Date   NA 140 03/07/2023   K 3.9 03/07/2023   CO2 28 03/07/2023   GLUCOSE 124 (H) 03/07/2023   BUN 16 03/07/2023   CREATININE 0.81 03/07/2023   BILITOT 0.4 03/07/2023   ALKPHOS 66 03/07/2023   AST 16 03/07/2023   ALT 14 03/07/2023   PROT 6.2 03/07/2023   ALBUMIN 4.2 03/07/2023   CALCIUM 9.4  03/07/2023   ANIONGAP 10 09/10/2021   GFR 75.39 03/07/2023   Lab Results  Component Value Date   CHOL 114 04/23/2022   Lab Results  Component Value Date   HDL 51.50 04/23/2022   Lab Results  Component Value Date   LDLCALC 51 04/23/2022   Lab Results  Component Value Date   TRIG 58.0 04/23/2022   Lab Results  Component Value Date   CHOLHDL 2 04/23/2022   Lab Results  Component Value Date   HGBA1C 6.1 (A) 10/29/2022      Assessment & Plan:  Blister Assessment &  Plan: Advised patient to dip the toe in warm Epsom salt water and apply Bactroban ointment 2-3 times a day. Advised to use blister pad to avoid friction. Patient would let us know if the blister is not getting better. Advised patient to use Bactroban ointment.   Encounter for immunization -     Flu Vaccine Trivalent High Dose (Fluad)  Other orders -     Mupirocin; Apply 1 Application topically 2 (two) times daily.  Dispense: 22 g; Refill: 1    Follow-up: Return if symptoms worsen or fail to improve.   Kara Dies, NP

## 2023-08-15 ENCOUNTER — Encounter: Payer: Self-pay | Admitting: Nurse Practitioner

## 2023-08-15 ENCOUNTER — Other Ambulatory Visit: Payer: Self-pay | Admitting: Family

## 2023-08-16 ENCOUNTER — Ambulatory Visit: Payer: Medicare HMO

## 2023-08-22 ENCOUNTER — Ambulatory Visit: Payer: Medicare HMO

## 2023-08-23 ENCOUNTER — Ambulatory Visit (INDEPENDENT_AMBULATORY_CARE_PROVIDER_SITE_OTHER): Payer: Medicare HMO

## 2023-08-23 ENCOUNTER — Encounter: Payer: Self-pay | Admitting: Family

## 2023-08-23 DIAGNOSIS — E538 Deficiency of other specified B group vitamins: Secondary | ICD-10-CM | POA: Diagnosis not present

## 2023-08-23 MED ORDER — CYANOCOBALAMIN 1000 MCG/ML IJ SOLN
1000.0000 ug | Freq: Once | INTRAMUSCULAR | Status: AC
Start: 2023-08-23 — End: 2023-08-23
  Administered 2023-08-23: 1000 ug via INTRAMUSCULAR

## 2023-08-23 NOTE — Telephone Encounter (Signed)
Spoke to pt and she has gotten it figured out

## 2023-08-23 NOTE — Progress Notes (Signed)
Patient presented for B 12 injection to left deltoid, patient voiced no concerns nor showed any signs of distress during injection. 

## 2023-08-24 ENCOUNTER — Telehealth: Payer: Self-pay

## 2023-08-24 ENCOUNTER — Other Ambulatory Visit: Payer: Self-pay | Admitting: Family

## 2023-08-24 DIAGNOSIS — E119 Type 2 diabetes mellitus without complications: Secondary | ICD-10-CM

## 2023-08-24 NOTE — Progress Notes (Signed)
Care Guide Note  08/24/2023 Name: [name] MRN: [id number] DOB: 1956/03/23  Referred by: Allegra Grana, FNP Reason for referral : Care Coordination (Outreach to schedule with Pharm d )   Danielle Dickerson is a 67 y.o. year old female who is a primary care patient of Allegra Grana, FNP. Danielle Dickerson was referred to the pharmacist for assistance related to DM.    Successful contact was made with the patient to discuss pharmacy services including being ready for the pharmacist to call at least 5 minutes before the scheduled appointment time, to have medication bottles and any blood sugar or blood pressure readings ready for review. The patient agreed to meet with the pharmacist via with the pharmacist via telephone visit on (date/time).  08/30/2023  Penne Lash, RMA Care Guide [facility]  [place], [place] [address] Direct Dial: [phone] [email or web address]

## 2023-08-25 ENCOUNTER — Telehealth: Payer: Self-pay | Admitting: Family

## 2023-08-25 NOTE — Telephone Encounter (Signed)
Paperwork filled out and placed in sign folder

## 2023-08-25 NOTE — Telephone Encounter (Signed)
Form signed and given to Daijah  Please fax and let pt know we have completed

## 2023-08-25 NOTE — Telephone Encounter (Signed)
Paper work faxed confirmation received.  Lvm for pt also mychart msg sent

## 2023-08-25 NOTE — Telephone Encounter (Signed)
Patient dropped off document  [employer] application , to be filled out by provider. Patient requested to send it via Fax within 5-days. Document is located in providers folder at front office.Please advise at [facility] [phone] The fax number is [phone]

## 2023-08-29 NOTE — Telephone Encounter (Signed)
Paperwork corrected signed and refaxed to [employer]. Paperwork was given to Clydie Braun to upload into pt's chart. Pt also notified

## 2023-08-29 NOTE — Telephone Encounter (Signed)
Patient called and [facility] stated that they are unable to process. The request may be in th S drive, [place]. Please call patient back.

## 2023-08-29 NOTE — Telephone Encounter (Signed)
Spoke with pt. Pt informed me the application need corrections. Informed pt would correct and get re-faxed for her,

## 2023-08-30 ENCOUNTER — Other Ambulatory Visit: Payer: Medicare HMO | Admitting: Pharmacist

## 2023-08-30 NOTE — Progress Notes (Signed)
08/30/2023 Name: RHYLIN ANSON MRN: [id number] DOB: 17-May-1956  Subjective  No chief complaint on file.   Reason for visit: Medication Assistance:  Patient referred to discuss medication access regarding Ozempic. Per chart notes, patient has been approved for [employer] MAP as of this morning.   Called patient to ensure that she has no other cost concerns at this time.   Patient denies additional cost concerns at this time.   She states that she was told by [facility] that the medication can take 10 weeks to ship. Assured her that this was likely a miscommunication. Since she has been approved, [facility] usually fills the medication within a few weeks and ships by standard mail (10-14 days).  We discussed that [facility] usually contacts patient when medication has shipped. Advised her to call [employer] MAP if she has not heard form them in ~2 weeks or so to get an updated estimate.   Patient will reach out to clinic should she have any further questions/concerns.    Future Appointments  Date Time Provider Department Center  08/30/2023  3:30 PM [facility] PHARMACIST [facility]  09/22/2023  2:30 PM [facility] NURSE [facility]  05/29/2024 11:00 AM [facility] ANNUAL WELLNESS VISIT [facility]   Loree Fee, PharmD Clinical Pharmacist [facility] [phone]

## 2023-09-03 ENCOUNTER — Other Ambulatory Visit: Payer: Self-pay | Admitting: Family

## 2023-09-05 NOTE — Telephone Encounter (Signed)
I do understand

## 2023-09-06 ENCOUNTER — Telehealth: Payer: Self-pay

## 2023-09-06 NOTE — Telephone Encounter (Signed)
Pt came in to pick up samples of Ozempic (3 boxes) @ 10:45 am on 10/8/ 24  until her pt assistance medication arrives

## 2023-09-08 ENCOUNTER — Telehealth: Payer: Self-pay

## 2023-09-08 NOTE — Telephone Encounter (Signed)
LVM to inform pt that her pt assistance medication Ozempic 1 mg (4 boxes) here and can be picked up anytime between 9 am & 5 pm M-F

## 2023-09-08 NOTE — Telephone Encounter (Signed)
Pt came in and picked up Pt assistance medication Ozempic 1 mg (4 boxes) on 09/08/23 @11 :45 am

## 2023-09-22 ENCOUNTER — Ambulatory Visit: Payer: Medicare HMO

## 2023-09-22 DIAGNOSIS — E538 Deficiency of other specified B group vitamins: Secondary | ICD-10-CM | POA: Diagnosis not present

## 2023-09-22 MED ORDER — CYANOCOBALAMIN 1000 MCG/ML IJ SOLN
1000.0000 ug | Freq: Once | INTRAMUSCULAR | Status: AC
Start: 2023-09-22 — End: 2023-09-22
  Administered 2023-09-22: 1000 ug via INTRAMUSCULAR

## 2023-09-22 NOTE — Progress Notes (Signed)
Pt came into clinic for B12 injection. Pt verified through 2 identifications. Injection placed in right deltoid. Pt tolerated injection well.

## 2023-10-20 ENCOUNTER — Ambulatory Visit: Payer: Medicare HMO

## 2023-10-20 DIAGNOSIS — E538 Deficiency of other specified B group vitamins: Secondary | ICD-10-CM | POA: Diagnosis not present

## 2023-10-20 MED ORDER — CYANOCOBALAMIN 1000 MCG/ML IJ SOLN
1000.0000 ug | Freq: Once | INTRAMUSCULAR | Status: AC
Start: 2023-10-20 — End: 2023-10-20
  Administered 2023-10-20: 1000 ug via INTRAMUSCULAR

## 2023-10-20 NOTE — Progress Notes (Signed)
Patient presented for B 12 injection to left deltoid, patient voiced no concerns nor showed any signs of distress during injection. 

## 2023-11-04 ENCOUNTER — Other Ambulatory Visit: Payer: Self-pay | Admitting: Family

## 2023-11-17 ENCOUNTER — Ambulatory Visit: Payer: Medicare HMO

## 2023-11-17 DIAGNOSIS — E538 Deficiency of other specified B group vitamins: Secondary | ICD-10-CM

## 2023-11-17 MED ORDER — CYANOCOBALAMIN 1000 MCG/ML IJ SOLN
1000.0000 ug | Freq: Once | INTRAMUSCULAR | Status: AC
Start: 2023-11-17 — End: 2023-11-17
  Administered 2023-11-17: 1000 ug via INTRAMUSCULAR

## 2023-11-17 NOTE — Progress Notes (Signed)
Pt presented for their vitamin B12 injection. Pt was identified through two identifiers. Pt tolerated shot well in their right deltoid.  

## 2023-11-21 ENCOUNTER — Encounter: Payer: Self-pay | Admitting: Family

## 2023-11-29 ENCOUNTER — Telehealth: Payer: Self-pay | Admitting: Family

## 2023-11-29 ENCOUNTER — Encounter: Payer: Self-pay | Admitting: Pharmacist

## 2023-11-29 NOTE — Telephone Encounter (Signed)
Noted! Placed in provider folder for signature

## 2023-11-29 NOTE — Progress Notes (Unsigned)
 Manufacturer Assistance Program (MAP) Application   Manufacturer: Novo Nordisk  (re-enrollment)  Medication(s): Ozempic   Patient Portion of Application:  12/31: Completed with patient via online enrollment tool.   Income Documentation: N/A - Electronic verification elected.  Provider Portion of Application:  12/31: Patient dropped off provider pages at front office today for PCP to complete Provider pages completed and signed by PCP 12/31 Prescription(s): Included in MAP application.   Application Status: Submitted via fax (pending approval)  Next Steps: [x]    PCP signature (12/31) [x]    Fax application to [employer] (11/29/23) []    Fax confirmation of approval received  Forwarded to [facility] Patient Advocate Team for future correspondences/re-enrollment.   *[facility] clinic team - Please Addend/update this note as the Next Steps are completed in office*

## 2023-11-29 NOTE — Telephone Encounter (Signed)
Patient dropped off Patient Asst Program forms. Forms are up front in [employer] color folder.

## 2023-12-19 ENCOUNTER — Ambulatory Visit: Payer: Medicare HMO

## 2023-12-19 DIAGNOSIS — E538 Deficiency of other specified B group vitamins: Secondary | ICD-10-CM | POA: Diagnosis not present

## 2023-12-19 MED ORDER — CYANOCOBALAMIN 1000 MCG/ML IJ SOLN
1000.0000 ug | Freq: Once | INTRAMUSCULAR | Status: AC
Start: 2023-12-19 — End: 2023-12-19
  Administered 2023-12-19: 1000 ug via INTRAMUSCULAR

## 2023-12-19 NOTE — Progress Notes (Signed)
Pt presented for their vitamin B12 injection. Pt was identified through two identifiers. Pt tolerated shot well in their left  deltoid.  

## 2024-01-16 ENCOUNTER — Ambulatory Visit (INDEPENDENT_AMBULATORY_CARE_PROVIDER_SITE_OTHER): Payer: Medicare HMO

## 2024-01-16 DIAGNOSIS — E538 Deficiency of other specified B group vitamins: Secondary | ICD-10-CM | POA: Diagnosis not present

## 2024-01-16 MED ORDER — CYANOCOBALAMIN 1000 MCG/ML IJ SOLN
1000.0000 ug | Freq: Once | INTRAMUSCULAR | Status: AC
Start: 2024-01-16 — End: 2024-01-16
  Administered 2024-01-16: 1000 ug via INTRAMUSCULAR

## 2024-01-16 NOTE — Progress Notes (Signed)
 Pt presented for their vitamin B12 injection. Pt was identified through two identifiers. Pt tolerated shot well in their right deltoid.

## 2024-01-26 ENCOUNTER — Encounter: Payer: Self-pay | Admitting: Family

## 2024-01-30 NOTE — Telephone Encounter (Signed)
 Patient is scheduled for an appointment with provider on 01/31/24. Patient was offered an appointment for today, but declined.

## 2024-01-31 ENCOUNTER — Encounter: Payer: Self-pay | Admitting: Family

## 2024-01-31 ENCOUNTER — Ambulatory Visit (INDEPENDENT_AMBULATORY_CARE_PROVIDER_SITE_OTHER): Admitting: Family

## 2024-01-31 VITALS — BP 112/68 | HR 75 | Temp 98.8°F | Wt 179.4 lb

## 2024-01-31 DIAGNOSIS — H109 Unspecified conjunctivitis: Secondary | ICD-10-CM | POA: Diagnosis not present

## 2024-01-31 DIAGNOSIS — F32A Depression, unspecified: Secondary | ICD-10-CM

## 2024-01-31 DIAGNOSIS — D649 Anemia, unspecified: Secondary | ICD-10-CM

## 2024-01-31 DIAGNOSIS — Z7985 Long-term (current) use of injectable non-insulin antidiabetic drugs: Secondary | ICD-10-CM

## 2024-01-31 DIAGNOSIS — R7309 Other abnormal glucose: Secondary | ICD-10-CM

## 2024-01-31 DIAGNOSIS — F411 Generalized anxiety disorder: Secondary | ICD-10-CM | POA: Insufficient documentation

## 2024-01-31 DIAGNOSIS — F419 Anxiety disorder, unspecified: Secondary | ICD-10-CM

## 2024-01-31 DIAGNOSIS — E119 Type 2 diabetes mellitus without complications: Secondary | ICD-10-CM | POA: Diagnosis not present

## 2024-01-31 DIAGNOSIS — Z0000 Encounter for general adult medical examination without abnormal findings: Secondary | ICD-10-CM

## 2024-01-31 DIAGNOSIS — Z7984 Long term (current) use of oral hypoglycemic drugs: Secondary | ICD-10-CM

## 2024-01-31 MED ORDER — CITALOPRAM HYDROBROMIDE 10 MG PO TABS: 10.0000 mg | ORAL_TABLET | Freq: Every day | ORAL | 3 refills | Status: DC

## 2024-01-31 MED ORDER — HYDROXYZINE HCL 10 MG PO TABS: 10.0000 mg | ORAL_TABLET | Freq: Two times a day (BID) | ORAL | 3 refills | Status: DC | PRN

## 2024-01-31 MED ORDER — OLOPATADINE HCL 0.1 % OP SOLN: 1.0000 [drp] | Freq: Two times a day (BID) | OPHTHALMIC | 1 refills | Status: DC

## 2024-01-31 MED ORDER — ERYTHROMYCIN 5 MG/GM OP OINT: TOPICAL_OINTMENT | OPHTHALMIC | 0 refills | Status: DC

## 2024-01-31 NOTE — Assessment & Plan Note (Signed)
 Presentation duration consistent with viral versus allergic conjunctivitis.  Advised patient to start Pataday eyedrops.  As she is traveling to [place] this week, I did provide her with erythromycin ointment to start if I were to exhibit purulent discharge or to be matted shut.  At that point I advised patient she may have developed bacterial conjunctivitis.  She will let me know how she is doing

## 2024-01-31 NOTE — Assessment & Plan Note (Signed)
 Uncontrolled.  Increase Celexa from 20 mg to 30 mg.  Trial of Atarax 10 to 20 mg twice daily as needed

## 2024-01-31 NOTE — Patient Instructions (Addendum)
  You may also trial melatonin combination over the counter supplement such as Sleep#3 ( L- theanine, camomile, lavender, valerian root, and melatonin 10mg )   or Qunol Sleep 5 in 1 (melatonin 5mg , Ashwagandha, GABA, valerian root, L-theanine)   Increase celexa to 30mg   Trial atarax 10-20mg  twice as needed; consider scheduling   I suspect viral versus allergic conjunctivitis.  You may use Pataday eyedrops ,antihistamine eyedrop  If you begin to see purulent discharge from your eye or your eyes become matted shut, at that point I would be concerned for bacterial conjunctivitis.  You may start erythromycin ophthalmic ointment at that point.  If you develop vision pain, vision changes, those are not symptoms of pinkeye and would warrant evaluation in the emergency room

## 2024-01-31 NOTE — Progress Notes (Signed)
 Assessment & Plan:  Conjunctivitis of right eye, unspecified conjunctivitis type Assessment & Plan: Presentation duration consistent with viral versus allergic conjunctivitis.  Advised patient to start Pataday eyedrops.  As she is traveling to [place] this week, I did provide her with erythromycin ointment to start if I were to exhibit purulent discharge or to be matted shut.  At that point I advised patient she may have developed bacterial conjunctivitis.  She will let me know how she is doing  Orders: -     Olopatadine HCl; Place 1 drop into both eyes 2 (two) times daily.  Dispense: 5 mL; Refill: 1 -     Erythromycin; Use one half inch four times daily to affected eye (s) x 7 days.  Dispense: 3.5 g; Refill: 0  Elevated glucose  Anemia, unspecified type -     CBC with Differential/Platelet; Future -     Comprehensive metabolic panel; Future  Initial Medicare annual wellness visit -     VITAMIN D 25 Hydroxy (Vit-D Deficiency, Fractures); Future -     TSH; Future  Type 2 diabetes mellitus without complication, without long-term current use of insulin (HCC) -     Microalbumin / creatinine urine ratio; Future -     Hemoglobin A1c; Future  Anxiety and depression Assessment & Plan: Uncontrolled.  Increase Celexa from 20 mg to 30 mg.  Trial of Atarax 10 to 20 mg twice daily as needed  Orders: -     Citalopram Hydrobromide; Take 1 tablet (10 mg total) by mouth daily. Take with celexa 20mg   Dispense: 90 tablet; Refill: 3 -     hydrOXYzine HCl; Take 1-2 tablets (10-20 mg total) by mouth 2 (two) times daily as needed for anxiety.  Dispense: 90 tablet; Refill: 3     Return precautions given.   Risks, benefits, and alternatives of the medications and treatment plan prescribed today were discussed, and patient expressed understanding.   Education regarding symptom management and diagnosis given to patient on AVS either electronically or printed.  Return in about 3 months (around  05/02/2024) for Fasting labs in 2-3 weeks.  Rennie Plowman, FNP  Subjective:    Patient ID: Danielle Dickerson, female    DOB: [birth year], 68 y.o.   MRN: [id number]  CC: Danielle Dickerson is a 68 y.o. female who presents today for an acute visit.    HPI: Complains right eye was pink x 1 day ago Less pink today.  Bilateral clear discharge from eyes Itchy  No gritty sensattion, vision loss, sinus pain   No contact lenses.    She describes increased anxiety and apathy due to political and world events, and worry over her daughter.  She is interested in increasing citalopram.   EKG 05/06/2022 QTc 444  Allergies: Pneumovax 23 [pneumococcal vac polyvalent] and Prevnar 13 [pneumococcal 13-val conj vacc] Current Outpatient Medications on File Prior to Visit  Medication Sig Dispense Refill   Accu-Chek FastClix Lancets MISC Used to check blood sugars twice a day. 100 each 3   aspirin EC 81 MG tablet Take 81 mg by mouth daily.     atorvastatin (LIPITOR) 40 MG tablet TAKE 1 TABLET BY MOUTH EVERY DAY 90 tablet 1   Blood Glucose Monitoring Suppl (ACCU-CHEK GUIDE ME) w/Device KIT USE AS DIRECTED 1 kit 0   Calcium-Vitamin D-Vitamin K (VIACTIV CALCIUM PLUS D PO) Take 1 tablet by mouth daily.     Cholecalciferol (VITAMIN D3 PO) Take 400 mg by mouth daily.  citalopram (CELEXA) 20 MG tablet TAKE 1 TABLET BY MOUTH EVERY DAY 90 tablet 2   famotidine (PEPCID) 20 MG tablet Take 20 mg by mouth at bedtime.     ferrous sulfate 325 (65 FE) MG EC tablet Take 325 mg by mouth 3 (three) times daily with meals.     glucose blood (ACCU-CHEK AVIVA PLUS) test strip Used to check blood sugars twice a day. 100 each 5   Insulin Pen Needle (PEN NEEDLES) 32G X 4 MM MISC Used to give Victoza injections. 100 each 1   Lancets (ONETOUCH ULTRASOFT) lancets Used to check blood sugars once a day. 100 each 4   lisinopril (ZESTRIL) 5 MG tablet TAKE 1 TABLET (5 MG TOTAL) BY MOUTH DAILY. 90 tablet 1   metFORMIN (GLUCOPHAGE) 1000 MG  tablet TAKE 1 TABLET (1,000 MG TOTAL) BY MOUTH TWICE A DAY WITH FOOD 180 tablet 1   Multiple Vitamins-Minerals (CENTRUM SILVER ADULT 50+ PO) Take 1 tablet by mouth daily.     ondansetron (ZOFRAN ODT) 4 MG disintegrating tablet Take 1 tablet (4 mg total) by mouth every 8 (eight) hours as needed. 30 tablet 1   pantoprazole (PROTONIX) 20 MG tablet TAKE 1 TABLET (20 MG TOTAL) BY MOUTH 2 (TWO) TIMES DAILY BEFORE A MEAL. 180 tablet 1   Semaglutide, 1 MG/DOSE, (OZEMPIC, 1 MG/DOSE,) 4 MG/3ML SOPN INJECT 1 MG INTO THE SKIN ONE TIME PER WEEK 6 mL 1   ipratropium (ATROVENT) 0.03 % nasal spray Place 2 sprays into both nostrils every 12 (twelve) hours. (Patient not taking: Reported on 01/31/2024) 30 mL 12   meloxicam (MOBIC) 7.5 MG tablet Take 1 tablet (7.5 mg total) by mouth daily as needed for pain. (Patient not taking: Reported on 01/31/2024) 30 tablet 1   mupirocin ointment (BACTROBAN) 2 % Apply 1 Application topically 2 (two) times daily. (Patient not taking: Reported on 01/31/2024) 22 g 1   promethazine-dextromethorphan (PROMETHAZINE-DM) 6.25-15 MG/5ML syrup Take 5 mLs by mouth at bedtime as needed for cough. (Patient not taking: Reported on 01/31/2024) 45 mL 1   No current facility-administered medications on file prior to visit.    Review of Systems  Constitutional:  Negative for chills and fever.  Respiratory:  Negative for cough.   Cardiovascular:  Negative for chest pain and palpitations.  Gastrointestinal:  Negative for nausea and vomiting.  Psychiatric/Behavioral:  Negative for sleep disturbance. The patient is nervous/anxious.       Objective:    BP 112/68   Pulse 75   Temp 98.8 F (37.1 C) (Oral)   Wt 179 lb 6.4 oz (81.4 kg)   SpO2 98%   BMI 24.33 kg/m   BP Readings from Last 3 Encounters:  01/31/24 112/68  08/04/23 120/76  03/21/23 118/78   Wt Readings from Last 3 Encounters:  01/31/24 179 lb 6.4 oz (81.4 kg)  08/04/23 173 lb 3.2 oz (78.6 kg)  05/25/23 172 lb (78 kg)    Physical  Exam Vitals reviewed.  Constitutional:      Appearance: She is well-developed.  HENT:     Head: Normocephalic and atraumatic.     Right Ear: Hearing, tympanic membrane, ear canal and external ear normal. No decreased hearing noted. No drainage, swelling or tenderness. No middle ear effusion. No foreign body. Tympanic membrane is not erythematous or bulging.     Left Ear: Hearing, tympanic membrane, ear canal and external ear normal. No decreased hearing noted. No drainage, swelling or tenderness.  No middle ear effusion. No foreign  body. Tympanic membrane is not erythematous or bulging.     Nose: No rhinorrhea.     Right Sinus: No maxillary sinus tenderness or frontal sinus tenderness.     Left Sinus: No maxillary sinus tenderness or frontal sinus tenderness.     Mouth/Throat:     Pharynx: Uvula midline. No oropharyngeal exudate or posterior oropharyngeal erythema.     Tonsils: No tonsillar abscesses.  Eyes:     General: Lids are everted, no foreign bodies appreciated. No scleral icterus.       Right eye: No discharge or hordeolum.        Left eye: No discharge or hordeolum.     Conjunctiva/sclera:     Right eye: Right conjunctiva is injected (slight injection). No hemorrhage.    Left eye: Left conjunctiva is not injected. No hemorrhage.    Pupils: Pupils are equal, round, and reactive to light.     Comments: No external eye lesions. Surrounding skin intact.   Right eye:   Scant injection of the conjunctiva. No white spots, opacity, or foreign body appreciated. No collection of blood or pus in the anterior chamber. No ciliary flush surrounding iris.   No photophobia or eye pain appreciated during exam.   Cardiovascular:     Rate and Rhythm: Regular rhythm.     Pulses: Normal pulses.     Heart sounds: Normal heart sounds.  Pulmonary:     Effort: Pulmonary effort is normal.     Breath sounds: Normal breath sounds. No wheezing, rhonchi or rales.  Lymphadenopathy:     Head:      Right side of head: No submental, submandibular, tonsillar, preauricular, posterior auricular or occipital adenopathy.     Left side of head: No submental, submandibular, tonsillar, preauricular, posterior auricular or occipital adenopathy.     Cervical: No cervical adenopathy.  Skin:    General: Skin is warm and dry.  Neurological:     Mental Status: She is alert.  Psychiatric:        Speech: Speech normal.        Behavior: Behavior normal.        Thought Content: Thought content normal.

## 2024-02-02 ENCOUNTER — Telehealth: Payer: Self-pay

## 2024-02-02 NOTE — Telephone Encounter (Signed)
 Pt has been notified.

## 2024-02-02 NOTE — Telephone Encounter (Signed)
 Pharmacy Patient Advocate Encounter   Received notification from  [facility] Portal that prior authorization for hydrOXYzine HCl 10MG  tablets is required/requested.   Insurance verification completed.   The patient is insured through [employer] [facility] Medicare.   Per test claim: PA required; PA submitted to above mentioned insurance via CoverMyMeds Key/confirmation #/EOC [id number] Status is pending

## 2024-02-02 NOTE — Telephone Encounter (Signed)
 Pharmacy Patient Advocate Encounter  Received notification from [employer] [facility] Medicare that Prior Authorization for hydrOXYzine HCl 10MG  tablets  has been DENIED.  See denial reason below. No denial letter attached in CMM. Will attach denial letter to Media tab once received.   PA #/Case ID/Reference #: [id number]

## 2024-02-09 ENCOUNTER — Telehealth: Payer: Self-pay

## 2024-02-09 NOTE — Telephone Encounter (Signed)
 Lvm to inform pt that her pt assistance medication was here 4 boxes of Ozempic can be picked up anytime between 9 am and 5 pm M-F

## 2024-02-12 NOTE — Progress Notes (Unsigned)
 Pt received B12 injection in left deltoid. Pt tolerated it well with no complaints or concerns. 

## 2024-02-13 ENCOUNTER — Ambulatory Visit (INDEPENDENT_AMBULATORY_CARE_PROVIDER_SITE_OTHER): Payer: Medicare HMO | Admitting: *Deleted

## 2024-02-13 DIAGNOSIS — E538 Deficiency of other specified B group vitamins: Secondary | ICD-10-CM | POA: Diagnosis not present

## 2024-02-13 MED ORDER — CYANOCOBALAMIN 1000 MCG/ML IJ SOLN
1000.0000 ug | Freq: Once | INTRAMUSCULAR | Status: AC
Start: 2024-02-13 — End: 2024-02-13
  Administered 2024-02-13: 1000 ug via INTRAMUSCULAR

## 2024-02-21 ENCOUNTER — Other Ambulatory Visit (INDEPENDENT_AMBULATORY_CARE_PROVIDER_SITE_OTHER)

## 2024-02-21 ENCOUNTER — Other Ambulatory Visit

## 2024-02-21 DIAGNOSIS — Z0000 Encounter for general adult medical examination without abnormal findings: Secondary | ICD-10-CM | POA: Diagnosis not present

## 2024-02-21 DIAGNOSIS — D649 Anemia, unspecified: Secondary | ICD-10-CM

## 2024-02-21 DIAGNOSIS — E119 Type 2 diabetes mellitus without complications: Secondary | ICD-10-CM | POA: Diagnosis not present

## 2024-02-21 LAB — COMPREHENSIVE METABOLIC PANEL
ALT: 15 U/L (ref 0–35)
AST: 17 U/L (ref 0–37)
Albumin: 4.2 g/dL (ref 3.5–5.2)
Alkaline Phosphatase: 65 U/L (ref 39–117)
BUN: 19 mg/dL (ref 6–23)
CO2: 29 meq/L (ref 19–32)
Calcium: 9.2 mg/dL (ref 8.4–10.5)
Chloride: 102 meq/L (ref 96–112)
Creatinine, Ser: 0.74 mg/dL (ref 0.40–1.20)
GFR: 83.46 mL/min (ref >60.00)
Glucose, Bld: 124 mg/dL — ABNORMAL HIGH (ref 70–99)
Potassium: 3.9 meq/L (ref 3.5–5.1)
Sodium: 138 meq/L (ref 135–145)
Total Bilirubin: 0.4 mg/dL (ref 0.2–1.2)
Total Protein: 6.5 g/dL (ref 6.0–8.3)

## 2024-02-21 LAB — CBC WITH DIFFERENTIAL/PLATELET
Basophils Absolute: 0 10*3/uL (ref 0.0–0.1)
Basophils Relative: 0.2 % (ref 0.0–3.0)
Eosinophils Absolute: 0.2 10*3/uL (ref 0.0–0.7)
Eosinophils Relative: 2.3 % (ref 0.0–5.0)
HCT: 35.1 % — ABNORMAL LOW (ref 36.0–46.0)
Hemoglobin: 12 g/dL (ref 12.0–15.0)
Lymphocytes Relative: 25 % (ref 12.0–46.0)
Lymphs Abs: 1.8 10*3/uL (ref 0.7–4.0)
MCHC: 34.3 g/dL (ref 30.0–36.0)
MCV: 85.8 fl (ref 78.0–100.0)
Monocytes Absolute: 0.4 10*3/uL (ref 0.1–1.0)
Monocytes Relative: 6.1 % (ref 3.0–12.0)
Neutro Abs: 4.7 10*3/uL (ref 1.4–7.7)
Neutrophils Relative %: 66.4 % (ref 43.0–77.0)
Platelets: 172 10*3/uL (ref 150.0–400.0)
RBC: 4.09 Mil/uL (ref 3.87–5.11)
RDW: 13.6 % (ref 11.5–15.5)
WBC: 7.1 10*3/uL (ref 4.0–10.5)

## 2024-02-21 LAB — MICROALBUMIN / CREATININE URINE RATIO
Creatinine,U: 99.1 mg/dL
Microalb Creat Ratio: 9.6 mg/g (ref 0.0–30.0)
Microalb, Ur: 0.9 mg/dL (ref 0.0–1.9)

## 2024-02-21 LAB — TSH: TSH: 1.67 u[IU]/mL (ref 0.35–5.50)

## 2024-02-21 LAB — HEMOGLOBIN A1C: Hgb A1c MFr Bld: 6.7 % — ABNORMAL HIGH (ref 4.6–6.5)

## 2024-02-21 LAB — VITAMIN D 25 HYDROXY (VIT D DEFICIENCY, FRACTURES): VITD: 46.28 ng/mL (ref 30.00–100.00)

## 2024-02-22 ENCOUNTER — Other Ambulatory Visit: Payer: Self-pay | Admitting: Family

## 2024-02-22 DIAGNOSIS — F419 Anxiety disorder, unspecified: Secondary | ICD-10-CM

## 2024-02-23 ENCOUNTER — Encounter: Payer: Self-pay | Admitting: Family

## 2024-02-25 ENCOUNTER — Other Ambulatory Visit: Payer: Self-pay | Admitting: Family

## 2024-03-19 ENCOUNTER — Ambulatory Visit (INDEPENDENT_AMBULATORY_CARE_PROVIDER_SITE_OTHER)

## 2024-03-19 DIAGNOSIS — E538 Deficiency of other specified B group vitamins: Secondary | ICD-10-CM

## 2024-03-19 MED ORDER — CYANOCOBALAMIN 1000 MCG/ML IJ SOLN
1000.0000 ug | Freq: Once | INTRAMUSCULAR | Status: AC
Start: 2024-03-19 — End: 2024-03-19
  Administered 2024-03-19: 1000 ug via INTRAMUSCULAR

## 2024-03-19 NOTE — Progress Notes (Signed)
 Pt presented for their vitamin B12 injection. Pt was identified through two identifiers. Pt tolerated shot well in their right deltoid.

## 2024-04-01 ENCOUNTER — Other Ambulatory Visit: Payer: Self-pay | Admitting: Family

## 2024-04-01 DIAGNOSIS — E119 Type 2 diabetes mellitus without complications: Secondary | ICD-10-CM

## 2024-04-01 DIAGNOSIS — I10 Essential (primary) hypertension: Secondary | ICD-10-CM

## 2024-04-01 DIAGNOSIS — K219 Gastro-esophageal reflux disease without esophagitis: Secondary | ICD-10-CM

## 2024-04-19 ENCOUNTER — Ambulatory Visit (INDEPENDENT_AMBULATORY_CARE_PROVIDER_SITE_OTHER)

## 2024-04-19 DIAGNOSIS — E538 Deficiency of other specified B group vitamins: Secondary | ICD-10-CM

## 2024-04-19 MED ORDER — CYANOCOBALAMIN 1000 MCG/ML IJ SOLN
1000.0000 ug | Freq: Once | INTRAMUSCULAR | Status: AC
Start: 2024-04-19 — End: 2024-04-19
  Administered 2024-04-19: 1000 ug via INTRAMUSCULAR

## 2024-04-19 NOTE — Progress Notes (Signed)
 Patient was administered a B12 injection into her left deltoid. Patient tolerated the B12 injection well.

## 2024-04-22 ENCOUNTER — Other Ambulatory Visit: Payer: Self-pay | Admitting: Family

## 2024-04-22 DIAGNOSIS — Z0000 Encounter for general adult medical examination without abnormal findings: Secondary | ICD-10-CM

## 2024-04-30 ENCOUNTER — Other Ambulatory Visit: Payer: Self-pay | Admitting: Family

## 2024-04-30 DIAGNOSIS — Z1231 Encounter for screening mammogram for malignant neoplasm of breast: Secondary | ICD-10-CM

## 2024-05-02 ENCOUNTER — Encounter: Payer: Self-pay | Admitting: Family

## 2024-05-02 ENCOUNTER — Ambulatory Visit (INDEPENDENT_AMBULATORY_CARE_PROVIDER_SITE_OTHER): Admitting: Family

## 2024-05-02 ENCOUNTER — Other Ambulatory Visit: Payer: Self-pay | Admitting: Family

## 2024-05-02 VITALS — BP 118/78 | HR 82 | Temp 97.7°F | Ht 73.0 in | Wt 176.6 lb

## 2024-05-02 DIAGNOSIS — K219 Gastro-esophageal reflux disease without esophagitis: Secondary | ICD-10-CM | POA: Diagnosis not present

## 2024-05-02 DIAGNOSIS — F419 Anxiety disorder, unspecified: Secondary | ICD-10-CM

## 2024-05-02 DIAGNOSIS — D510 Vitamin B12 deficiency anemia due to intrinsic factor deficiency: Secondary | ICD-10-CM

## 2024-05-02 DIAGNOSIS — R110 Nausea: Secondary | ICD-10-CM | POA: Diagnosis not present

## 2024-05-02 DIAGNOSIS — E119 Type 2 diabetes mellitus without complications: Secondary | ICD-10-CM | POA: Diagnosis not present

## 2024-05-02 DIAGNOSIS — I10 Essential (primary) hypertension: Secondary | ICD-10-CM | POA: Diagnosis not present

## 2024-05-02 DIAGNOSIS — F32A Depression, unspecified: Secondary | ICD-10-CM

## 2024-05-02 DIAGNOSIS — Z7985 Long-term (current) use of injectable non-insulin antidiabetic drugs: Secondary | ICD-10-CM

## 2024-05-02 MED ORDER — PANTOPRAZOLE SODIUM 20 MG PO TBEC: 20.0000 mg | DELAYED_RELEASE_TABLET | Freq: Every day | ORAL | Status: DC

## 2024-05-02 MED ORDER — ONDANSETRON 4 MG PO TBDP: 4.0000 mg | ORAL_TABLET | Freq: Three times a day (TID) | ORAL | 1 refills | Status: AC | PRN

## 2024-05-02 NOTE — Assessment & Plan Note (Signed)
 Chronic, improved. Continue celexa  20mg  every day. She will continue to keep atarax  10mg  to use prn.

## 2024-05-02 NOTE — Assessment & Plan Note (Signed)
Chronic, stable.  Continue lisinopril 5 mg QD 

## 2024-05-02 NOTE — Progress Notes (Signed)
 Assessment & Plan:  Type 2 diabetes mellitus without complication, without long-term current use of insulin  (HCC) -     Comprehensive metabolic panel with GFR; Future -     Lipid panel; Future -     Hemoglobin A1c; Future  Nausea -     Ondansetron ; Take 1 tablet (4 mg total) by mouth every 8 (eight) hours as needed.  Dispense: 30 tablet; Refill: 1  Gastroesophageal reflux disease, unspecified whether esophagitis present -     Pantoprazole  Sodium; Take 1 tablet (20 mg total) by mouth daily.  Pernicious anemia -     B12 and Folate Panel; Future  Primary hypertension Assessment & Plan: Chronic, stable.  Continue lisinopril  5 mg QD   Anxiety and depression Assessment & Plan: Chronic, improved. Continue celexa  20mg  every day. She will continue to keep atarax  10mg  to use prn.       Return precautions given.   Risks, benefits, and alternatives of the medications and treatment plan prescribed today were discussed, and patient expressed understanding.   Education regarding symptom management and diagnosis given to patient on AVS either electronically or printed.  Return in about 4 months (around 09/01/2024).  Bascom Bossier, FNP  Subjective:    Patient ID: Danielle Dickerson, female    DOB: 04-Mar-1956, 68 y.o.   MRN: [id number]  CC: Danielle Dickerson is a 68 y.o. female who presents today for follow up.   HPI: She feels well today. No new concerns She takes very rare zofran , when eats something that 'disagrees' with her. This has been on going for years.  She doesn't feel nauseated with ozempic .   Walking regularly.    Anxiety has improved. She didn't increase celexa  to 30mg  . She has taken atarax  once.   Compliant with B12 monthly  Mammogram scheduled  Allergies: Pneumovax 23 [pneumococcal vac polyvalent] and Prevnar 13 [pneumococcal 13-val conj vacc] Current Outpatient Medications on File Prior to Visit  Medication Sig Dispense Refill   Accu-Chek FastClix Lancets  MISC Used to check blood sugars twice a day. 100 each 3   aspirin EC 81 MG tablet Take 81 mg by mouth daily.     atorvastatin  (LIPITOR) 40 MG tablet TAKE 1 TABLET BY MOUTH EVERY DAY 90 tablet 1   Blood Glucose Monitoring Suppl (ACCU-CHEK GUIDE ME) w/Device KIT USE AS DIRECTED 1 kit 0   Calcium -Vitamin D -Vitamin K (VIACTIV CALCIUM  PLUS D PO) Take 1 tablet by mouth daily.     Cholecalciferol (VITAMIN D3 PO) Take 400 mg by mouth daily.     citalopram  (CELEXA ) 20 MG tablet TAKE 1 TABLET BY MOUTH EVERY DAY (Patient not taking: Reported on 05/02/2024) 90 tablet 2   famotidine (PEPCID) 20 MG tablet Take 20 mg by mouth at bedtime.     ferrous sulfate 325 (65 FE) MG EC tablet Take 325 mg by mouth 3 (three) times daily with meals.     glucose blood (ACCU-CHEK AVIVA PLUS) test strip Used to check blood sugars twice a day. 100 each 5   hydrOXYzine  (ATARAX ) 10 MG tablet TAKE 1-2 TABLETS (10-20 MG TOTAL) BY MOUTH 2 (TWO) TIMES DAILY AS NEEDED FOR ANXIETY. 270 tablet 2   Insulin  Pen Needle (PEN NEEDLES) 32G X 4 MM MISC Used to give Victoza  injections. 100 each 1   Lancets (ONETOUCH ULTRASOFT) lancets Used to check blood sugars once a day. 100 each 4   lisinopril  (ZESTRIL ) 5 MG tablet TAKE 1 TABLET (5 MG TOTAL) BY MOUTH DAILY. 90  tablet 1   metFORMIN  (GLUCOPHAGE ) 1000 MG tablet TAKE 1 TABLET (1,000 MG TOTAL) BY MOUTH TWICE A DAY WITH FOOD 180 tablet 1   Multiple Vitamins-Minerals (CENTRUM SILVER ADULT 50+ PO) Take 1 tablet by mouth daily.     Semaglutide , 1 MG/DOSE, (OZEMPIC , 1 MG/DOSE,) 4 MG/3ML SOPN INJECT 1 MG INTO THE SKIN ONE TIME PER WEEK 6 mL 1   No current facility-administered medications on file prior to visit.    Review of Systems  Constitutional:  Negative for chills and fever.  Respiratory:  Negative for cough.   Cardiovascular:  Negative for chest pain and palpitations.  Gastrointestinal:  Negative for nausea and vomiting.      Objective:    BP 118/78   Pulse 82   Temp 97.7 F (36.5 C)  (Oral)   Ht 6\' 1"  (1.854 m)   Wt 176 lb 9.6 oz (80.1 kg)   SpO2 99%   BMI 23.30 kg/m  BP Readings from Last 3 Encounters:  05/02/24 118/78  01/31/24 112/68  08/04/23 120/76   Wt Readings from Last 3 Encounters:  05/02/24 176 lb 9.6 oz (80.1 kg)  01/31/24 179 lb 6.4 oz (81.4 kg)  08/04/23 173 lb 3.2 oz (78.6 kg)      05/02/2024    8:20 AM 01/31/2024   11:20 AM 08/04/2023    8:46 AM  Depression screen PHQ 2/9  Decreased Interest 0 1 0  Down, Depressed, Hopeless 0 3 0  PHQ - 2 Score 0 4 0  Altered sleeping  1 0  Tired, decreased energy  1 0  Change in appetite  0 0  Feeling bad or failure about yourself   0 0  Trouble concentrating  0 0  Moving slowly or fidgety/restless  0 0  Suicidal thoughts  0 0  PHQ-9 Score  6 0  Difficult doing work/chores  Somewhat difficult Not difficult at all    Physical Exam Vitals reviewed.  Constitutional:      Appearance: She is well-developed.  Eyes:     Conjunctiva/sclera: Conjunctivae normal.  Cardiovascular:     Rate and Rhythm: Normal rate and regular rhythm.     Pulses: Normal pulses.     Heart sounds: Normal heart sounds.  Pulmonary:     Effort: Pulmonary effort is normal.     Breath sounds: Normal breath sounds. No wheezing, rhonchi or rales.  Skin:    General: Skin is warm and dry.  Neurological:     Mental Status: She is alert.  Psychiatric:        Speech: Speech normal.        Behavior: Behavior normal.        Thought Content: Thought content normal.

## 2024-05-09 ENCOUNTER — Ambulatory Visit
Admission: RE | Admit: 2024-05-09 | Discharge: 2024-05-09 | Disposition: A | Discharge status: Home / Self Care | Source: Ambulatory Visit | Attending: Family | Admitting: Family

## 2024-05-09 DIAGNOSIS — Z1231 Encounter for screening mammogram for malignant neoplasm of breast: Secondary | ICD-10-CM | POA: Insufficient documentation

## 2024-05-16 ENCOUNTER — Telehealth: Payer: Self-pay

## 2024-05-16 NOTE — Telephone Encounter (Signed)
 Spoke to pt and informed her that her pt assistance medication Ozempic  (4 boxes) is ready for pickup anytime between 9 am -5 pm M-F

## 2024-05-21 ENCOUNTER — Telehealth: Payer: Self-pay

## 2024-05-21 ENCOUNTER — Ambulatory Visit (INDEPENDENT_AMBULATORY_CARE_PROVIDER_SITE_OTHER)

## 2024-05-21 DIAGNOSIS — E538 Deficiency of other specified B group vitamins: Secondary | ICD-10-CM | POA: Diagnosis not present

## 2024-05-21 MED ORDER — CYANOCOBALAMIN 1000 MCG/ML IJ SOLN
1000.0000 ug | Freq: Once | INTRAMUSCULAR | Status: AC
  Administered 2024-05-21: 1000 ug via INTRAMUSCULAR

## 2024-05-21 NOTE — Telephone Encounter (Signed)
 Pt came in to pick up pt assistance medication (4 boxes) @ 12:20 pm

## 2024-05-21 NOTE — Progress Notes (Signed)
 Patient presented for B 12 injection to right deltoid, patient voiced no concerns nor showed any signs of distress during injection.

## 2024-06-05 LAB — HM DIABETES EYE EXAM

## 2024-06-08 ENCOUNTER — Ambulatory Visit: Payer: Self-pay | Admitting: Family

## 2024-06-08 ENCOUNTER — Other Ambulatory Visit (INDEPENDENT_AMBULATORY_CARE_PROVIDER_SITE_OTHER)

## 2024-06-08 DIAGNOSIS — D510 Vitamin B12 deficiency anemia due to intrinsic factor deficiency: Secondary | ICD-10-CM | POA: Diagnosis not present

## 2024-06-08 DIAGNOSIS — E119 Type 2 diabetes mellitus without complications: Secondary | ICD-10-CM

## 2024-06-08 DIAGNOSIS — Z7984 Long term (current) use of oral hypoglycemic drugs: Secondary | ICD-10-CM | POA: Diagnosis not present

## 2024-06-08 DIAGNOSIS — Z7985 Long-term (current) use of injectable non-insulin antidiabetic drugs: Secondary | ICD-10-CM | POA: Diagnosis not present

## 2024-06-08 LAB — B12 AND FOLATE PANEL
Folate: >23.4 ng/mL (ref >5.9)
Vitamin B-12: 379 pg/mL (ref 211–911)

## 2024-06-08 LAB — COMPREHENSIVE METABOLIC PANEL WITH GFR
ALT: 15 U/L (ref 0–35)
AST: 16 U/L (ref 0–37)
Albumin: 4.1 g/dL (ref 3.5–5.2)
Alkaline Phosphatase: 66 U/L (ref 39–117)
BUN: 18 mg/dL (ref 6–23)
CO2: 31 meq/L (ref 19–32)
Calcium: 9.3 mg/dL (ref 8.4–10.5)
Chloride: 104 meq/L (ref 96–112)
Creatinine, Ser: 0.83 mg/dL (ref 0.40–1.20)
GFR: 72.57 mL/min (ref >60.00)
Glucose, Bld: 135 mg/dL — ABNORMAL HIGH (ref 70–99)
Potassium: 4.1 meq/L (ref 3.5–5.1)
Sodium: 140 meq/L (ref 135–145)
Total Bilirubin: 0.4 mg/dL (ref 0.2–1.2)
Total Protein: 6.5 g/dL (ref 6.0–8.3)

## 2024-06-08 LAB — LIPID PANEL
Cholesterol: 118 mg/dL (ref 0–200)
HDL: 55.2 mg/dL (ref >39.00)
LDL Cholesterol: 55 mg/dL (ref 0–99)
NonHDL: 62.98
Total CHOL/HDL Ratio: 2
Triglycerides: 39 mg/dL (ref 0.0–149.0)
VLDL: 7.8 mg/dL (ref 0.0–40.0)

## 2024-06-08 LAB — HEMOGLOBIN A1C: Hgb A1c MFr Bld: 6.9 % — ABNORMAL HIGH (ref 4.6–6.5)

## 2024-06-20 ENCOUNTER — Ambulatory Visit

## 2024-06-20 DIAGNOSIS — E538 Deficiency of other specified B group vitamins: Secondary | ICD-10-CM | POA: Diagnosis not present

## 2024-06-20 MED ORDER — CYANOCOBALAMIN 1000 MCG/ML IJ SOLN
1000.0000 ug | Freq: Once | INTRAMUSCULAR | Status: AC
  Administered 2024-06-20: 1000 ug via INTRAMUSCULAR

## 2024-06-20 NOTE — Progress Notes (Signed)
 Patient was administered a b12 injection into her left deltoid. Patient tolerated the b12 injection well.

## 2024-06-27 ENCOUNTER — Ambulatory Visit (INDEPENDENT_AMBULATORY_CARE_PROVIDER_SITE_OTHER)

## 2024-06-27 DIAGNOSIS — E538 Deficiency of other specified B group vitamins: Secondary | ICD-10-CM

## 2024-06-27 MED ORDER — CYANOCOBALAMIN 1000 MCG/ML IJ SOLN
1000.0000 ug | Freq: Once | INTRAMUSCULAR | Status: AC
  Administered 2024-06-27: 1000 ug via INTRAMUSCULAR

## 2024-06-27 NOTE — Progress Notes (Signed)
 Patient was administered a B12 injection into her right deltoid. Patient tolerated the B12 injection well.

## 2024-07-04 ENCOUNTER — Ambulatory Visit

## 2024-07-09 ENCOUNTER — Ambulatory Visit

## 2024-07-11 ENCOUNTER — Ambulatory Visit (INDEPENDENT_AMBULATORY_CARE_PROVIDER_SITE_OTHER)

## 2024-07-11 DIAGNOSIS — E538 Deficiency of other specified B group vitamins: Secondary | ICD-10-CM | POA: Diagnosis not present

## 2024-07-11 MED ORDER — CYANOCOBALAMIN 1000 MCG/ML IJ SOLN
1000.0000 ug | Freq: Once | INTRAMUSCULAR | Status: AC
  Administered 2024-07-11 (×2): 1000 ug via INTRAMUSCULAR

## 2024-07-11 NOTE — Progress Notes (Signed)
 Patient was administered a B12 injection into her left deltoid. Patient tolerated the B12 injection well.

## 2024-07-13 ENCOUNTER — Ambulatory Visit: Payer: Self-pay | Admitting: Family Medicine

## 2024-07-13 ENCOUNTER — Encounter: Payer: Self-pay | Admitting: Family Medicine

## 2024-07-13 ENCOUNTER — Ambulatory Visit: Payer: Self-pay

## 2024-07-13 ENCOUNTER — Ambulatory Visit
Admission: RE | Admit: 2024-07-13 | Discharge: 2024-07-13 | Disposition: A | Discharge status: Home / Self Care | Source: Ambulatory Visit | Attending: Family Medicine | Admitting: Family Medicine

## 2024-07-13 ENCOUNTER — Ambulatory Visit (INDEPENDENT_AMBULATORY_CARE_PROVIDER_SITE_OTHER): Admitting: Family Medicine

## 2024-07-13 VITALS — BP 138/72 | HR 72 | Temp 98.4°F | Ht 73.0 in | Wt 180.4 lb

## 2024-07-13 DIAGNOSIS — M545 Low back pain, unspecified: Secondary | ICD-10-CM | POA: Diagnosis not present

## 2024-07-13 DIAGNOSIS — R1032 Left lower quadrant pain: Secondary | ICD-10-CM

## 2024-07-13 DIAGNOSIS — R310 Gross hematuria: Secondary | ICD-10-CM | POA: Diagnosis not present

## 2024-07-13 DIAGNOSIS — R1084 Generalized abdominal pain: Secondary | ICD-10-CM | POA: Insufficient documentation

## 2024-07-13 LAB — POC URINALSYSI DIPSTICK (AUTOMATED)
Bilirubin, UA: NEGATIVE
Glucose, UA: NEGATIVE
Ketones, UA: NEGATIVE
Leukocytes, UA: NEGATIVE
Nitrite, UA: NEGATIVE
Protein, UA: NEGATIVE
Spec Grav, UA: 1.02 (ref 1.010–1.025)
Urobilinogen, UA: 0.2 U/dL
pH, UA: 5 (ref 5.0–8.0)

## 2024-07-13 LAB — CBC WITH DIFFERENTIAL/PLATELET
Basophils Absolute: 0 K/uL (ref 0.0–0.1)
Basophils Relative: 0.4 % (ref 0.0–3.0)
Eosinophils Absolute: 0.1 K/uL (ref 0.0–0.7)
Eosinophils Relative: 1.8 % (ref 0.0–5.0)
HCT: 33.6 % — ABNORMAL LOW (ref 36.0–46.0)
Hemoglobin: 11.5 g/dL — ABNORMAL LOW (ref 12.0–15.0)
Lymphocytes Relative: 26.2 % (ref 12.0–46.0)
Lymphs Abs: 1.6 K/uL (ref 0.7–4.0)
MCHC: 34.2 g/dL (ref 30.0–36.0)
MCV: 85.1 fl (ref 78.0–100.0)
Monocytes Absolute: 0.4 K/uL (ref 0.1–1.0)
Monocytes Relative: 6.5 % (ref 3.0–12.0)
Neutro Abs: 3.9 K/uL (ref 1.4–7.7)
Neutrophils Relative %: 65.1 % (ref 43.0–77.0)
Platelets: 159 K/uL (ref 150.0–400.0)
RBC: 3.94 Mil/uL (ref 3.87–5.11)
RDW: 13.6 % (ref 11.5–15.5)
WBC: 6 K/uL (ref 4.0–10.5)

## 2024-07-13 LAB — BASIC METABOLIC PANEL WITH GFR
BUN: 14 mg/dL (ref 6–23)
CO2: 30 meq/L (ref 19–32)
Calcium: 9.2 mg/dL (ref 8.4–10.5)
Chloride: 103 meq/L (ref 96–112)
Creatinine, Ser: 0.8 mg/dL (ref 0.40–1.20)
GFR: 75.8 mL/min (ref >60.00)
Glucose, Bld: 123 mg/dL — ABNORMAL HIGH (ref 70–99)
Potassium: 3.9 meq/L (ref 3.5–5.1)
Sodium: 139 meq/L (ref 135–145)

## 2024-07-13 MED ORDER — TAMSULOSIN HCL 0.4 MG PO CAPS: 0.4000 mg | ORAL_CAPSULE | Freq: Every day | ORAL | 3 refills | Status: AC

## 2024-07-13 MED ORDER — TRAMADOL HCL 50 MG PO TABS: 50.0000 mg | ORAL_TABLET | Freq: Three times a day (TID) | ORAL | 0 refills | Status: AC | PRN

## 2024-07-13 NOTE — Assessment & Plan Note (Signed)
 Acute, associated with hematuria perineal pressure and low back pain. Most likely etiology is renal stone possibly at the ureteral vesicle junction on the left.  Will send urine for culture although no clear sign of infection given no leukocytes or nitrite. Encourage patient to push fluids, start Flomax  0.4 mg daily.  She will strain her urine. We will move forward with renal CT scan noncontrast stat. She can control pain if needed with tramadol  50 mg 1 tablet every 3 hours as needed.  Return and ER precautions reviewed with patient in detail.  If she has fever, unable to keep down liquids or severe pain she will go to the emergency room for treatment. She will also contact her PCP or urologist office if she is not passing the stone within the next week.

## 2024-07-13 NOTE — Telephone Encounter (Signed)
 Pt scheduled her to see Amy @ [facility] @ 9:30 am

## 2024-07-13 NOTE — Telephone Encounter (Signed)
  FYI Only or Action Required?: Action required by provider: request for appointment. Requesting to be worked in for today  Patient was last seen in primary care on 05/02/2024 by Dineen Rollene MATSU, FNP.  Called Nurse Triage reporting Back Pain - urinary frequency and discomfort  Symptoms began today.  Interventions attempted: Nothing.  Symptoms are: unchanged.  Triage Disposition: See HCP Within 4 Hours (Or PCP Triage)  Patient/caregiver understands and will follow disposition?: Unsure advised UC - will wait for call back from office.                  Copied from CRM #[id number]. Topic: Clinical - Red Word Triage >> Jul 13, 2024  8:03 AM Berneda FALCON wrote: Red Word that prompted transfer to Nurse Triage: Pt states last night she had frequent urination and lower back pain near kidneys. Feels like legs are being pulled apart. States this happened before and she is unsure if this is a kidney stone or UTI.  Pt states last time this happened she ended up in the ED with sepsis. Reason for Disposition  [1] Pain or burning with passing urine (urination) AND [2] flank (e.g., in side of back, below ribs and above hip)  Answer Assessment - Initial Assessment Questions 1. ONSET: When did the pain begin? (e.g., minutes, hours, days)     Overnight 2. LOCATION: Where does it hurt? (upper, mid or lower back)     Back and urethra 3. SEVERITY: How bad is the pain?  (e.g., Scale 1-10; mild, moderate, or severe)     Middle of the night 5-6/10 - right now mild 4. PATTERN: Is the pain constant? (e.g., yes, no; constant, intermittent)      Comes and goes 5. RADIATION: Does the pain shoot into your legs or somewhere else?     no 6. CAUSE:  What do you think is causing the back pain?      Kidney stone, UTI 7. BACK OVERUSE:  Any recent lifting of heavy objects, strenuous work or exercise?     no 8. MEDICINES: What have you taken so far for the pain? (e.g., nothing,  acetaminophen , NSAIDS)     no 9. NEUROLOGIC SYMPTOMS: Do you have any weakness, numbness, or problems with bowel/bladder control?     no 10. OTHER SYMPTOMS: Do you have any other symptoms? (e.g., fever, abdomen pain, burning with urination, blood in urine)       Frequent urination  Protocols used: Back Pain-A-AH

## 2024-07-13 NOTE — Progress Notes (Signed)
 Patient ID: Danielle Dickerson, female    DOB: [birth year], 68 y.o.   MRN: [id number]  This visit was conducted in person.  BP 138/72   Pulse 72   Temp 98.4 F (36.9 C) (Temporal)   Ht 6' 1 (1.854 m)   Wt 180 lb 6 oz (81.8 kg)   SpO2 99%   BMI 23.80 kg/m    CC:  Chief Complaint  Patient presents with   Back Pain    Concerned she has a kidney stone   Urinary Frequency   Nausea    Subjective:   HPI: Danielle Dickerson is a 68 y.o. female patient of M. Arnett, NP presenting on 07/13/2024 for Back Pain (Concerned she has a kidney stone), Urinary Frequency, and Nausea  Last week had  temporary pressure.. went away.    Last night acute onset pain in  low/mid back  bilateral follow-up bilateral below Dr. Avelina Mar okay and you normally see Rollene.  Perineal pressure/pain 3-5/10 She states current symptoms are identical to what she felt like  with past renal stone  No fever.  No flank  pain.  No dysuria.   She has also noted urinary frequency and nausea    Hx of  renal stones, 2022 or 2023.. Dr. Penne was urologist. She had a 3 mm stone that would not pass and resulted in urosepsis.   2022 ultrasound renal reviewed  PDMP reviewed during this encounter.   Relevant past medical, surgical, family and social history reviewed and updated as indicated. Interim medical history since our last visit reviewed. Allergies and medications reviewed and updated. Outpatient Medications Prior to Visit  Medication Sig Dispense Refill   Accu-Chek FastClix Lancets MISC Used to check blood sugars twice a day. 100 each 3   aspirin EC 81 MG tablet Take 81 mg by mouth daily.     atorvastatin  (LIPITOR) 40 MG tablet TAKE 1 TABLET BY MOUTH EVERY DAY 90 tablet 1   Blood Glucose Monitoring Suppl (ACCU-CHEK GUIDE ME) w/Device KIT USE AS DIRECTED 1 kit 0   Calcium -Vitamin D -Vitamin K (VIACTIV CALCIUM  PLUS D PO) Take 1 tablet by mouth daily.     Cholecalciferol (VITAMIN D3 PO) Take 400 mg by mouth  daily.     citalopram  (CELEXA ) 20 MG tablet TAKE 1 TABLET BY MOUTH EVERY DAY 90 tablet 2   famotidine (PEPCID) 20 MG tablet Take 20 mg by mouth at bedtime.     ferrous sulfate 325 (65 FE) MG EC tablet Take 325 mg by mouth 3 (three) times daily with meals.     glucose blood (ACCU-CHEK AVIVA PLUS) test strip Used to check blood sugars twice a day. 100 each 5   hydrOXYzine  (ATARAX ) 10 MG tablet TAKE 1-2 TABLETS (10-20 MG TOTAL) BY MOUTH 2 (TWO) TIMES DAILY AS NEEDED FOR ANXIETY. 270 tablet 2   Insulin  Pen Needle (PEN NEEDLES) 32G X 4 MM MISC Used to give Victoza  injections. 100 each 1   Lancets (ONETOUCH ULTRASOFT) lancets Used to check blood sugars once a day. 100 each 4   lisinopril  (ZESTRIL ) 5 MG tablet TAKE 1 TABLET (5 MG TOTAL) BY MOUTH DAILY. 90 tablet 1   metFORMIN  (GLUCOPHAGE ) 1000 MG tablet TAKE 1 TABLET (1,000 MG TOTAL) BY MOUTH TWICE A DAY WITH FOOD 180 tablet 1   Multiple Vitamins-Minerals (CENTRUM SILVER ADULT 50+ PO) Take 1 tablet by mouth daily.     ondansetron  (ZOFRAN  ODT) 4 MG disintegrating tablet Take 1 tablet (4 mg  total) by mouth every 8 (eight) hours as needed. 30 tablet 1   pantoprazole (PROTONIX) 20 MG tablet Take 1 tablet (20 mg total) by mouth daily.     Semaglutide, 1 MG/DOSE, (OZEMPIC, 1 MG/DOSE,) 4 MG/3ML SOPN INJECT 1 MG INTO THE SKIN ONE TIME PER WEEK 6 mL 1   No facility-administered medications prior to visit.     Per HPI unless specifically indicated in ROS section below Review of Systems  Constitutional:  Negative for fatigue and fever.  HENT:  Negative for congestion.   Eyes:  Negative for pain.  Respiratory:  Negative for cough and shortness of breath.   Cardiovascular:  Negative for chest pain, palpitations and leg swelling.  Gastrointestinal:  Negative for abdominal pain.  Genitourinary:  Positive for urgency. Negative for dysuria and vaginal bleeding.  Musculoskeletal:  Positive for back pain.  Neurological:  Negative for syncope, light-headedness and  headaches.  Psychiatric/Behavioral:  Negative for dysphoric mood.    Objective:  BP 138/72   Pulse 72   Temp 98.4 F (36.9 C) (Temporal)   Ht 6' 1 (1.854 m)   Wt 180 lb 6 oz (81.8 kg)   SpO2 99%   BMI 23.80 kg/m   Wt Readings from Last 3 Encounters:  07/13/24 180 lb 6 oz (81.8 kg)  05/02/24 176 lb 9.6 oz (80.1 kg)  01/31/24 179 lb 6.4 oz (81.4 kg)      Physical Exam Constitutional:      General: She is not in acute distress.    Appearance: Normal appearance. She is well-developed. She is not ill-appearing or toxic-appearing.  HENT:     Head: Normocephalic.     Right Ear: Hearing, tympanic membrane, ear canal and external ear normal. Tympanic membrane is not erythematous, retracted or bulging.     Left Ear: Hearing, tympanic membrane, ear canal and external ear normal. Tympanic membrane is not erythematous, retracted or bulging.     Nose: No mucosal edema or rhinorrhea.     Right Sinus: No maxillary sinus tenderness or frontal sinus tenderness.     Left Sinus: No maxillary sinus tenderness or frontal sinus tenderness.     Mouth/Throat:     Pharynx: Uvula midline.  Eyes:     General: Lids are normal. Lids are everted, no foreign bodies appreciated.     Conjunctiva/sclera: Conjunctivae normal.     Pupils: Pupils are equal, round, and reactive to light.  Neck:     Thyroid: No thyroid mass or thyromegaly.     Vascular: No carotid bruit.     Trachea: Trachea normal.  Cardiovascular:     Rate and Rhythm: Normal rate and regular rhythm.     Pulses: Normal pulses.     Heart sounds: Normal heart sounds, S1 normal and S2 normal. No murmur heard.    No friction rub. No gallop.  Pulmonary:     Effort: Pulmonary effort is normal. No tachypnea or respiratory distress.     Breath sounds: Normal breath sounds. No decreased breath sounds, wheezing, rhonchi or rales.  Abdominal:     General: Bowel sounds are normal. There is no distension.     Palpations: Abdomen is soft. There is no  mass.     Tenderness: There is abdominal tenderness. There is no guarding or rebound.     Hernia: No hernia is present.  Musculoskeletal:     Cervical back: Normal range of motion and neck supple.     Lumbar back: Tenderness present. No bony tenderness.  Normal range of motion.  Skin:    General: Skin is warm and dry.     Findings: No rash.  Neurological:     Mental Status: She is alert.  Psychiatric:        Mood and Affect: Mood is not anxious or depressed.        Speech: Speech normal.        Behavior: Behavior normal. Behavior is cooperative.        Thought Content: Thought content normal.        Judgment: Judgment normal.       Results for orders placed or performed in visit on 07/13/24  POCT Urinalysis Dipstick (Automated)   Collection Time: 07/13/24 10:00 AM  Result Value Ref Range   Color, UA Yellow    Clarity, UA Clear    Glucose, UA Negative Negative   Bilirubin, UA Negative    Ketones, UA Negative    Spec Grav, UA 1.020 1.010 - 1.025   Blood, UA Large (3+)    pH, UA 5.0 5.0 - 8.0   Protein, UA Negative Negative   Urobilinogen, UA 0.2 0.2 or 1.0 E.U./dL   Nitrite, UA Negative    Leukocytes, UA Negative Negative    Assessment and Plan  Acute bilateral low back pain without sciatica -     POCT Urinalysis Dipstick (Automated) -     Urine Culture  Gross hematuria -     Urine Culture  Left lower quadrant abdominal pain Assessment & Plan: Acute, associated with hematuria perineal pressure and low back pain. Most likely etiology is renal stone possibly at the ureteral vesicle junction on the left.  Will send urine for culture although no clear sign of infection given no leukocytes or nitrite. Encourage patient to push fluids, start Flomax  0.4 mg daily.  She will strain her urine. We will move forward with renal CT scan noncontrast stat. She can control pain if needed with tramadol  50 mg 1 tablet every 3 hours as needed.  Return and ER precautions reviewed with  patient in detail.  If she has fever, unable to keep down liquids or severe pain she will go to the emergency room for treatment. She will also contact her PCP or urologist office if she is not passing the stone within the next week.  Orders: -     CT RENAL STONE STUDY; Future -     CBC with Differential/Platelet -     Basic metabolic panel with GFR  Other orders -     Tamsulosin  HCl; Take 1 capsule (0.4 mg total) by mouth daily.  Dispense: 15 capsule; Refill: 3 -     traMADol  HCl; Take 1 tablet (50 mg total) by mouth every 8 (eight) hours as needed for up to 5 days.  Dispense: 15 tablet; Refill: 0    No follow-ups on file.   Greig Ring, MD

## 2024-07-15 LAB — URINE CULTURE
MICRO NUMBER:: 16838214
Result:: NO GROWTH
SPECIMEN QUALITY:: ADEQUATE

## 2024-07-18 ENCOUNTER — Ambulatory Visit (INDEPENDENT_AMBULATORY_CARE_PROVIDER_SITE_OTHER)

## 2024-07-18 DIAGNOSIS — E538 Deficiency of other specified B group vitamins: Secondary | ICD-10-CM | POA: Diagnosis not present

## 2024-07-18 MED ORDER — CYANOCOBALAMIN 1000 MCG/ML IJ SOLN
1000.0000 ug | Freq: Once | INTRAMUSCULAR | Status: AC
  Administered 2024-07-18: 1000 ug via INTRAMUSCULAR

## 2024-07-18 NOTE — Progress Notes (Addendum)
 Patient was administered a B12 injection into her left deltoid due to having a Bruise on the right deltoid. Patient tolerated the b12 injection well.

## 2024-07-20 ENCOUNTER — Other Ambulatory Visit: Payer: Self-pay | Admitting: Family Medicine

## 2024-08-20 ENCOUNTER — Ambulatory Visit (INDEPENDENT_AMBULATORY_CARE_PROVIDER_SITE_OTHER)

## 2024-08-20 DIAGNOSIS — E538 Deficiency of other specified B group vitamins: Secondary | ICD-10-CM | POA: Diagnosis not present

## 2024-08-20 MED ORDER — CYANOCOBALAMIN 1000 MCG/ML IJ SOLN
1000.0000 ug | Freq: Once | INTRAMUSCULAR | Status: AC
  Administered 2024-08-20: 1000 ug via INTRAMUSCULAR

## 2024-08-20 NOTE — Progress Notes (Signed)
Pt received B12 injection in right deltoid muscle. Pt tolerated it well with no complaints or concerns.  

## 2024-08-25 ENCOUNTER — Other Ambulatory Visit: Payer: Self-pay | Admitting: Family

## 2024-09-03 ENCOUNTER — Ambulatory Visit: Admitting: Family

## 2024-09-03 ENCOUNTER — Encounter: Payer: Self-pay | Admitting: Family

## 2024-09-03 VITALS — BP 130/78 | HR 78 | Temp 97.6°F | Ht 70.0 in | Wt 177.2 lb

## 2024-09-03 DIAGNOSIS — Z7984 Long term (current) use of oral hypoglycemic drugs: Secondary | ICD-10-CM | POA: Diagnosis not present

## 2024-09-03 DIAGNOSIS — I10 Essential (primary) hypertension: Secondary | ICD-10-CM | POA: Diagnosis not present

## 2024-09-03 DIAGNOSIS — E119 Type 2 diabetes mellitus without complications: Secondary | ICD-10-CM | POA: Diagnosis not present

## 2024-09-03 NOTE — Progress Notes (Signed)
 Assessment & Plan:  Type 2 diabetes mellitus without complication, without long-term current use of insulin  [facility]) Assessment & Plan: Presume stable. A couple of days early to obtain A1c. Ordered. Continue metformin  1000mg , ozempic  1mg  for now.   Orders: -     Hemoglobin A1c; Future  Primary hypertension Assessment & Plan: Chronic, stable.  Continue lisinopril  5 mg QD  Orders: -     Comprehensive metabolic panel with GFR; Future     Return precautions given.   Risks, benefits, and alternatives of the medications and treatment plan prescribed today were discussed, and patient expressed understanding.   Education regarding symptom management and diagnosis given to patient on AVS either electronically or printed.  Return in about 4 months (around 01/04/2025).  [name], FNP  Subjective:    Patient ID: [name], female    DOB: 1956-04-01, 68 y.o.   MRN: [id number]  CC: KHRISTY KALAN is a 68 y.o. female who presents today for follow up.   HPI: Feels well today Tolerating ozempic  and metformin  well.  She continues to stay active, walking slightly less, of late due to cleaning out storage unit/garage.  She plans to increase walking.   She remains compliant with lisinopril   Denies fatigue    Mammogram UTD  Allergies: Pneumovax 23 [pneumococcal vac polyvalent] and Prevnar 13 [pneumococcal 13-val conj vacc] Current Outpatient Medications on File Prior to Visit  Medication Sig Dispense Refill   Accu-Chek FastClix Lancets MISC Used to check blood sugars twice a day. 100 each 3   aspirin EC 81 MG tablet Take 81 mg by mouth daily.     atorvastatin  (LIPITOR) 40 MG tablet TAKE 1 TABLET BY MOUTH EVERY DAY 90 tablet 1   Blood Glucose Monitoring Suppl (ACCU-CHEK GUIDE ME) w/Device KIT USE AS DIRECTED 1 kit 0   Calcium -Vitamin D -Vitamin K (VIACTIV CALCIUM  PLUS D PO) Take 1 tablet by mouth daily.     Cholecalciferol (VITAMIN D3 PO) Take 400 mg by mouth daily.      citalopram  (CELEXA ) 20 MG tablet TAKE 1 TABLET BY MOUTH EVERY DAY 90 tablet 2   famotidine (PEPCID) 20 MG tablet Take 20 mg by mouth at bedtime.     ferrous sulfate 325 (65 FE) MG EC tablet Take 325 mg by mouth 3 (three) times daily with meals.     glucose blood (ACCU-CHEK AVIVA PLUS) test strip Used to check blood sugars twice a day. 100 each 5   hydrOXYzine  (ATARAX ) 10 MG tablet TAKE 1-2 TABLETS (10-20 MG TOTAL) BY MOUTH 2 (TWO) TIMES DAILY AS NEEDED FOR ANXIETY. 270 tablet 2   Insulin  Pen Needle (PEN NEEDLES) 32G X 4 MM MISC Used to give Victoza  injections. 100 each 1   Lancets (ONETOUCH ULTRASOFT) lancets Used to check blood sugars once a day. 100 each 4   lisinopril  (ZESTRIL ) 5 MG tablet TAKE 1 TABLET (5 MG TOTAL) BY MOUTH DAILY. 90 tablet 1   metFORMIN  (GLUCOPHAGE ) 1000 MG tablet TAKE 1 TABLET (1,000 MG TOTAL) BY MOUTH TWICE A DAY WITH FOOD 180 tablet 1   Multiple Vitamins-Minerals (CENTRUM SILVER ADULT 50+ PO) Take 1 tablet by mouth daily.     ondansetron  (ZOFRAN  ODT) 4 MG disintegrating tablet Take 1 tablet (4 mg total) by mouth every 8 (eight) hours as needed. 30 tablet 1   pantoprazole  (PROTONIX ) 20 MG tablet Take 1 tablet (20 mg total) by mouth daily.     Semaglutide , 1 MG/DOSE, (OZEMPIC , 1 MG/DOSE,) 4 MG/3ML SOPN INJECT  1 MG INTO THE SKIN ONE TIME PER WEEK 6 mL 1   tamsulosin  (FLOMAX ) 0.4 MG CAPS capsule Take 1 capsule (0.4 mg total) by mouth daily. 15 capsule 3   No current facility-administered medications on file prior to visit.    Review of Systems  Constitutional:  Negative for chills and fever.  Respiratory:  Negative for cough.   Cardiovascular:  Negative for chest pain and palpitations.  Gastrointestinal:  Negative for nausea and vomiting.      Objective:    BP 130/78   Pulse 78   Temp 97.6 F (36.4 C) (Oral)   Ht 5' 10 (1.778 m)   Wt 177 lb 3.2 oz (80.4 kg)   SpO2 98%   BMI 25.43 kg/m  BP Readings from Last 3 Encounters:  09/03/24 130/78  07/13/24 138/72   05/02/24 118/78   Wt Readings from Last 3 Encounters:  09/03/24 177 lb 3.2 oz (80.4 kg)  07/13/24 180 lb 6 oz (81.8 kg)  05/02/24 176 lb 9.6 oz (80.1 kg)    Physical Exam Vitals reviewed.  Constitutional:      Appearance: She is well-developed.  Eyes:     Conjunctiva/sclera: Conjunctivae normal.  Cardiovascular:     Rate and Rhythm: Normal rate and regular rhythm.     Pulses: Normal pulses.     Heart sounds: Normal heart sounds.  Pulmonary:     Effort: Pulmonary effort is normal.     Breath sounds: Normal breath sounds. No wheezing, rhonchi or rales.  Skin:    General: Skin is warm and dry.  Neurological:     Mental Status: She is alert.  Psychiatric:        Speech: Speech normal.        Behavior: Behavior normal.        Thought Content: Thought content normal.

## 2024-09-03 NOTE — Assessment & Plan Note (Signed)
 Presume stable. A couple of days early to obtain A1c. Ordered. Continue metformin  1000mg , ozempic  1mg  for now.

## 2024-09-03 NOTE — Assessment & Plan Note (Signed)
Chronic, stable.  Continue lisinopril 5 mg QD 

## 2024-09-04 ENCOUNTER — Encounter: Payer: Self-pay | Admitting: Pharmacist

## 2024-09-17 ENCOUNTER — Ambulatory Visit: Payer: Self-pay | Admitting: Family

## 2024-09-17 ENCOUNTER — Other Ambulatory Visit (INDEPENDENT_AMBULATORY_CARE_PROVIDER_SITE_OTHER)

## 2024-09-17 DIAGNOSIS — E119 Type 2 diabetes mellitus without complications: Secondary | ICD-10-CM | POA: Diagnosis not present

## 2024-09-17 DIAGNOSIS — I10 Essential (primary) hypertension: Secondary | ICD-10-CM

## 2024-09-17 LAB — COMPREHENSIVE METABOLIC PANEL WITH GFR
ALT: 17 U/L (ref 0–35)
AST: 17 U/L (ref 0–37)
Albumin: 4.1 g/dL (ref 3.5–5.2)
Alkaline Phosphatase: 61 U/L (ref 39–117)
BUN: 19 mg/dL (ref 6–23)
CO2: 28 meq/L (ref 19–32)
Calcium: 8.9 mg/dL (ref 8.4–10.5)
Chloride: 103 meq/L (ref 96–112)
Creatinine, Ser: 0.73 mg/dL (ref 0.40–1.20)
GFR: 84.5 mL/min (ref >60.00)
Glucose, Bld: 122 mg/dL — ABNORMAL HIGH (ref 70–99)
Potassium: 4.2 meq/L (ref 3.5–5.1)
Sodium: 139 meq/L (ref 135–145)
Total Bilirubin: 0.5 mg/dL (ref 0.2–1.2)
Total Protein: 6 g/dL (ref 6.0–8.3)

## 2024-09-17 LAB — HEMOGLOBIN A1C: Hgb A1c MFr Bld: 6.8 % — ABNORMAL HIGH (ref 4.6–6.5)

## 2024-09-19 ENCOUNTER — Ambulatory Visit (INDEPENDENT_AMBULATORY_CARE_PROVIDER_SITE_OTHER)

## 2024-09-19 DIAGNOSIS — E538 Deficiency of other specified B group vitamins: Secondary | ICD-10-CM

## 2024-09-19 MED ORDER — CYANOCOBALAMIN 1000 MCG/ML IJ SOLN
1000.0000 ug | Freq: Once | INTRAMUSCULAR | Status: AC
  Administered 2024-09-19: 1000 ug via INTRAMUSCULAR

## 2024-09-19 NOTE — Progress Notes (Signed)
 Patient was administered a B12 injection into her left deltoid. Patient tolerated the B12 injection well.

## 2024-09-26 ENCOUNTER — Ambulatory Visit: Admitting: *Deleted

## 2024-09-26 VITALS — Ht 72.0 in | Wt 177.0 lb

## 2024-09-26 DIAGNOSIS — Z0000 Encounter for general adult medical examination without abnormal findings: Secondary | ICD-10-CM | POA: Diagnosis not present

## 2024-09-26 NOTE — Patient Instructions (Signed)
 Ms. Pitney,  Thank you for taking the time for your Medicare Wellness Visit. I appreciate your continued commitment to your health goals. Please review the care plan we discussed, and feel free to reach out if I can assist you further.  Medicare recommends these wellness visits once per year to help you and your care team stay ahead of potential health issues. These visits are designed to focus on prevention, allowing your provider to concentrate on managing your acute and chronic conditions during your regular appointments.  Please note that Annual Wellness Visits do not include a physical exam. Some assessments may be limited, especially if the visit was conducted virtually. If needed, we may recommend a separate in-person follow-up with your provider.  Ongoing Care Seeing your primary care provider every 3 to 6 months helps us  monitor your health and provide consistent, personalized care.  Remember to keep your vaccines up to date.  Referrals If a referral was made during today's visit and you haven't received any updates within two weeks, please contact the referred provider directly to check on the status.  Recommended Screenings:  Health Maintenance  Topic Date Due   COVID-19 Vaccine (13 - 2025-26 season) 10/03/2024*   Yearly kidney health urinalysis for diabetes  02/20/2025   Hemoglobin A1C  03/18/2025   Breast Cancer Screening  05/09/2025   Eye exam for diabetics  06/05/2025   Complete foot exam   09/03/2025   Yearly kidney function blood test for diabetes  09/17/2025   Medicare Annual Wellness Visit  09/26/2025   Colon Cancer Screening  02/02/2026   DTaP/Tdap/Td vaccine (3 - Td or Tdap) 04/25/2034   Flu Shot  Completed   Hepatitis C Screening  Completed   Zoster (Shingles) Vaccine  Completed   Meningitis B Vaccine  Aged Out   Pneumococcal Vaccine for age over 29  Discontinued   DEXA scan (bone density measurement)  Discontinued  *Topic was postponed. The date shown is not  the original due date.       09/26/2024   11:10 AM  Advanced Directives  Does Patient Have a Medical Advance Directive? No  Would patient like information on creating a medical advance directive? No - Patient declined   Advance Care Planning is important because it: Ensures you receive medical care that aligns with your values, goals, and preferences. Provides guidance to your family and loved ones, reducing the emotional burden of decision-making during critical moments.  Vision: Annual vision screenings are recommended for early detection of glaucoma, cataracts, and diabetic retinopathy. These exams can also reveal signs of chronic conditions such as diabetes and high blood pressure.  Dental: Annual dental screenings help detect early signs of oral cancer, gum disease, and other conditions linked to overall health, including heart disease and diabetes.  Please see the attached documents for additional preventive care recommendations.

## 2024-09-26 NOTE — Progress Notes (Signed)
 Subjective:   Danielle Dickerson is a 68 y.o. who presents for a Medicare Wellness preventive visit.  As a reminder, Annual Wellness Visits don't include a physical exam, and some assessments may be limited, especially if this visit is performed virtually. We may recommend an in-person follow-up visit with your provider if needed.  Visit Complete: Virtual I connected with  Danielle Dickerson on 09/26/24 by a audio enabled telemedicine application and verified that I am speaking with the correct person using two identifiers.  Patient Location: Home  Provider Location: Home Office  I discussed the limitations of evaluation and management by telemedicine. The patient expressed understanding and agreed to proceed.  Vital Signs: Because this visit was a virtual/telehealth visit, some criteria may be missing or patient reported. Any vitals not documented were not able to be obtained and vitals that have been documented are patient reported.  VideoDeclined- This patient declined Librarian, academic. Therefore the visit was completed with audio only.  Persons Participating in Visit: Patient.  AWV Questionnaire: No: Patient Medicare AWV questionnaire was not completed prior to this visit.  Cardiac Risk Factors include: advanced age (>15men, >25 women);diabetes mellitus;dyslipidemia;hypertension     Objective:    Today's Vitals   09/26/24 1052  Weight: 177 lb (80.3 kg)  Height: 6' (1.829 m)   Body mass index is 24.01 kg/m.     09/26/2024   11:10 AM 05/25/2023   11:52 AM 10/07/2021    6:58 AM 09/21/2021    8:00 AM 09/17/2021   12:16 PM 09/07/2021    6:00 PM 09/07/2021    1:19 PM  Advanced Directives  Does Patient Have a Medical Advance Directive? No Yes No Yes No No No  Type of Special Educational Needs Teacher of [place];Living will Living will;Healthcare Power of Attorney Living will     Copy of Healthcare Power of Attorney in Chart?  No - copy requested        Would patient like information on creating a medical advance directive? No - Patient declined  No - Patient declined  No - Patient declined No - Patient declined     Current Medications (verified) Outpatient Encounter Medications as of 09/26/2024  Medication Sig   Accu-Chek FastClix Lancets MISC Used to check blood sugars twice a day.   aspirin EC 81 MG tablet Take 81 mg by mouth daily.   atorvastatin  (LIPITOR) 40 MG tablet TAKE 1 TABLET BY MOUTH EVERY DAY   Blood Glucose Monitoring Suppl (ACCU-CHEK GUIDE ME) w/Device KIT USE AS DIRECTED   Calcium -Vitamin D -Vitamin K (VIACTIV CALCIUM  PLUS D PO) Take 1 tablet by mouth daily.   Cholecalciferol (VITAMIN D3 PO) Take 400 mg by mouth daily.   citalopram  (CELEXA ) 20 MG tablet TAKE 1 TABLET BY MOUTH EVERY DAY   docusate sodium  (COLACE) 100 MG capsule Take 100 mg by mouth daily.   famotidine (PEPCID) 20 MG tablet Take 20 mg by mouth at bedtime.   ferrous sulfate 325 (65 FE) MG EC tablet Take 325 mg by mouth 3 (three) times daily with meals.   glucose blood (ACCU-CHEK AVIVA PLUS) test strip Used to check blood sugars twice a day.   hydrOXYzine  (ATARAX ) 10 MG tablet TAKE 1-2 TABLETS (10-20 MG TOTAL) BY MOUTH 2 (TWO) TIMES DAILY AS NEEDED FOR ANXIETY.   Insulin  Pen Needle (PEN NEEDLES) 32G X 4 MM MISC Used to give Victoza  injections.   Lancets (ONETOUCH ULTRASOFT) lancets Used to check blood sugars once a day.  lisinopril  (ZESTRIL ) 5 MG tablet TAKE 1 TABLET (5 MG TOTAL) BY MOUTH DAILY.   metFORMIN  (GLUCOPHAGE ) 1000 MG tablet TAKE 1 TABLET (1,000 MG TOTAL) BY MOUTH TWICE A DAY WITH FOOD   Multiple Vitamins-Minerals (CENTRUM SILVER ADULT 50+ PO) Take 1 tablet by mouth daily.   ondansetron  (ZOFRAN  ODT) 4 MG disintegrating tablet Take 1 tablet (4 mg total) by mouth every 8 (eight) hours as needed.   pantoprazole  (PROTONIX ) 20 MG tablet Take 1 tablet (20 mg total) by mouth daily.   Probiotic Product (PROBIOTIC DAILY PO) Take by mouth daily.    Semaglutide , 1 MG/DOSE, (OZEMPIC , 1 MG/DOSE,) 4 MG/3ML SOPN INJECT 1 MG INTO THE SKIN ONE TIME PER WEEK   tamsulosin  (FLOMAX ) 0.4 MG CAPS capsule Take 1 capsule (0.4 mg total) by mouth daily. (Patient not taking: Reported on 09/26/2024)   No facility-administered encounter medications on file as of 09/26/2024.    Allergies (verified) Pneumovax 23 [pneumococcal vac polyvalent] and Prevnar 13 [pneumococcal 13-val conj vacc]   History: Past Medical History:  Diagnosis Date   COVID-19    05/27/21   Depression    Diabetes mellitus without complication (HCC)    Type II   Eosinophilic esophagitis    Esophageal stricture 2019   GERD (gastroesophageal reflux disease)    Hyperlipidemia    Pernicious anemia    Past Surgical History:  Procedure Laterality Date   BREAST EXCISIONAL BIOPSY Right 2009   phyllodes removed    BREAST SURGERY     CESAREAN SECTION     CHOLECYSTECTOMY OPEN     COLONOSCOPY WITH PROPOFOL  N/A 02/02/2021   Procedure: COLONOSCOPY WITH PROPOFOL ;  Surgeon: Unk Corinn Skiff, MD;  Location: [facility] ENDOSCOPY;  Service: Gastroenterology;  Laterality: N/A;   CYSTOSCOPY WITH STENT PLACEMENT Right 09/07/2021   Procedure: CYSTOSCOPY WITH STENT PLACEMENT; WITH RETROGRADE PYELOGRAM;  Surgeon: Penne Knee, MD;  Location: [facility] ORS;  Service: Urology;  Laterality: Right;   CYSTOSCOPY/RETROGRADE/URETEROSCOPY/STONE EXTRACTION WITH BASKET  09/21/2021   Procedure: CYSTOSCOPY/RETROGRADE/URETEROSCOPY/STONE EXTRACTION WITH BASKET;  Surgeon: Penne Knee, MD;  Location: [facility] ORS;  Service: Urology;;   ESOPHAGOGASTRODUODENOSCOPY (EGD) WITH PROPOFOL  N/A 10/07/2021   Procedure: ESOPHAGOGASTRODUODENOSCOPY (EGD) WITH PROPOFOL ;  Surgeon: Unk Corinn Skiff, MD;  Location: [facility] ENDOSCOPY;  Service: Gastroenterology;  Laterality: N/A;   SUPRACERVICAL ABDOMINAL HYSTERECTOMY  2006   has ovaries. Had heavy periods and was anemic. HAS cervix   TONSILLECTOMY  1966   Family History  Problem  Relation Age of Onset   Congestive Heart Failure Mother    CVA Mother 90   Colon cancer Father    Esophageal cancer Maternal Uncle    Thyroid  cancer Neg Hx    Breast cancer Neg Hx    Social History   Socioeconomic History   Marital status: Married    Spouse name: Not on file   Number of children: Not on file   Years of education: Not on file   Highest education level: Associate degree: academic program  Occupational History   Not on file  Tobacco Use   Smoking status: Never   Smokeless tobacco: Never  Vaping Use   Vaping status: Never Used  Substance and Sexual Activity   Alcohol use: Not Currently   Drug use: Never   Sexual activity: Yes    Partners: Male  Other Topics Concern   Not on file  Social History Narrative   Moved from [place]  07/2019      Married   Husband retired   2 daughters that live  in [place]      Homeschooling 6 yo grandson, Woodie   Social Drivers of Health   Financial Resource Strain: Low Risk  (09/26/2024)   Overall Financial Resource Strain (CARDIA)    Difficulty of Paying Living Expenses: Not hard at all  Food Insecurity: No Food Insecurity (09/26/2024)   Hunger Vital Sign    Worried About Running Out of Food in the Last Year: Never true    Ran Out of Food in the Last Year: Never true  Transportation Needs: No Transportation Needs (09/26/2024)   PRAPARE - Administrator, Civil Service (Medical): No    Lack of Transportation (Non-Medical): No  Physical Activity: Sufficiently Active (09/26/2024)   Exercise Vital Sign    Days of Exercise per Week: 5 days    Minutes of Exercise per Session: 40 min  Stress: No Stress Concern Present (09/26/2024)   [employer] of Occupational Health - Occupational Stress Questionnaire    Feeling of Stress: Not at all  Social Connections: Moderately Isolated (09/26/2024)   Social Connection and Isolation Panel    Frequency of Communication with Friends and Family: More than three  times a week    Frequency of Social Gatherings with Friends and Family: More than three times a week    Attends Religious Services: Never    Database Administrator or Organizations: No    Attends Engineer, Structural: Never    Marital Status: Married    Tobacco Counseling Counseling given: Not Answered    Clinical Intake:  Pre-visit preparation completed: Yes  Pain : No/denies pain     BMI - recorded: 24.01 Nutritional Status: BMI of 19-24  Normal Nutritional Risks: None Diabetes: Yes CBG done?: No  Lab Results  Component Value Date   HGBA1C 6.8 (H) 09/17/2024   HGBA1C 6.9 (H) 06/08/2024   HGBA1C 6.7 (H) 02/21/2024     How often do you need to have someone help you when you read instructions, pamphlets, or other written materials from your doctor or pharmacy?: 1 - Never  Interpreter Needed?: No  Information entered by :: R. Katti Pelle LPN   Activities of Daily Living     09/26/2024   10:55 AM  In your present state of health, do you have any difficulty performing the following activities:  Hearing? 1  Vision? 0  Difficulty concentrating or making decisions? 0  Walking or climbing stairs? 0  Dressing or bathing? 0  Doing errands, shopping? 0  Preparing Food and eating ? N  Using the Toilet? N  In the past six months, have you accidently leaked urine? N  Do you have problems with loss of bowel control? N  Managing your Medications? N  Managing your Finances? N  Housekeeping or managing your Housekeeping? N    Patient Care Team: Dineen Rollene MATSU, FNP as PCP - General (Family Medicine) Darliss Rogue, MD as PCP - Cardiology (Cardiology)  I have updated your Care Teams any recent Medical Services you may have received from other providers in the past year.     Assessment:   This is a routine wellness examination for Danielle Dickerson.  Hearing/Vision screen Hearing Screening - Comments:: Some issues probably needs aids in the future Vision Screening -  Comments:: glasses   Goals Addressed             This Visit's Progress    Patient Stated       Wants to continue to exercise and stay active  Depression Screen     09/26/2024   11:05 AM 09/03/2024    9:00 AM 07/13/2024    9:55 AM 05/02/2024    8:20 AM 01/31/2024   11:20 AM 08/04/2023    8:46 AM 05/25/2023   11:57 AM  PHQ 2/9 Scores  PHQ - 2 Score 0 0 0 0 4 0 0  PHQ- 9 Score 0 0 0  6 0 0    Fall Risk     09/26/2024   10:57 AM 09/03/2024    9:00 AM 07/13/2024    9:55 AM 05/02/2024    8:22 AM 05/02/2024    8:20 AM  Fall Risk   Falls in the past year? 0 0 0 0 0  Number falls in past yr: 0 0 0 0 0  Injury with Fall? 0 0 0 0 0  Risk for fall due to : No Fall Risks No Fall Risks No Fall Risks No Fall Risks No Fall Risks  Follow up Falls evaluation completed;Falls prevention discussed Falls evaluation completed Falls evaluation completed Falls evaluation completed Falls evaluation completed    MEDICARE RISK AT HOME:  Medicare Risk at Home Any stairs in or around the home?: Yes If so, are there any without handrails?: No Home free of loose throw rugs in walkways, pet beds, electrical cords, etc?: Yes Adequate lighting in your home to reduce risk of falls?: Yes Life alert?: No Use of a cane, walker or w/c?: No Grab bars in the bathroom?: No Shower chair or bench in shower?: Yes Elevated toilet seat or a handicapped toilet?: No  TIMED UP AND GO:  Was the test performed?  No  Cognitive Function: 6CIT completed    03/26/2022    9:00 AM  MMSE - Mini Mental State Exam  Orientation to time 5  Orientation to Place 5  Registration 3  Attention/ Calculation 5  Recall 3  Language- name 2 objects 2  Language- repeat 1  Language- follow 3 step command 3  Language- read & follow direction 1  Write a sentence 1  Copy design 1  Total score 30        09/26/2024   11:11 AM 05/25/2023   11:53 AM  6CIT Screen  What Year? 0 points 0 points  What month? 0 points 0 points   What time? 0 points 0 points  Count back from 20 0 points 0 points  Months in reverse 0 points 0 points  Repeat phrase 0 points 0 points  Total Score 0 points 0 points    Immunizations Immunization History  Administered Date(s) Administered   Fluad Quad(high Dose 65+) 08/20/2022   Fluad Trivalent(High Dose 65+) 08/04/2023   Influenza,inj,Quad PF,6+ Mos 08/08/2020   Influenza-Unspecified 09/10/2019, 08/31/2021, 08/20/2022   Moderna Covid-19 Fall Seasonal Vaccine 89yrs & older 08/20/2022, 08/25/2023   PFIZER Comirnaty(Gray Top)Covid-19 Tri-Sucrose Vaccine 03/07/2020, 03/28/2020, 09/04/2020, 03/02/2021   PFIZER(Purple Top)SARS-COV-2 Vaccination 02/15/2020, 03/07/2020, 03/28/2020, 09/05/2020   Pfizer Covid-19 Vaccine Bivalent Booster 39yrs & up 08/31/2021   Pneumococcal Polysaccharide-23 05/06/2020, 05/15/2021   Respiratory Syncytial Virus Vaccine,Recomb Aduvanted(Arexvy) 09/23/2022   Tdap 05/06/2020, 04/25/2024   Zoster Recombinant(Shingrix) 04/20/2016, 07/21/2016    Screening Tests Health Maintenance  Topic Date Due   Medicare Annual Wellness (AWV)  05/24/2024   COVID-19 Vaccine (13 - 2025-26 season) 10/03/2024 (Originally 07/30/2024)   Diabetic kidney evaluation - Urine ACR  02/20/2025   HEMOGLOBIN A1C  03/18/2025   Mammogram  05/09/2025   OPHTHALMOLOGY EXAM  06/05/2025   FOOT EXAM  09/03/2025   Diabetic kidney evaluation - eGFR measurement  09/17/2025   Colonoscopy  02/02/2026   DTaP/Tdap/Td (3 - Td or Tdap) 04/25/2034   Influenza Vaccine  Completed   Hepatitis C Screening  Completed   Zoster Vaccines- Shingrix  Completed   Meningococcal B Vaccine  Aged Out   Pneumococcal Vaccine: 50+ Years  Discontinued   DEXA SCAN  Discontinued    Health Maintenance Items Addressed: Discussed the need to stay up to date with vaccines.  Additional Screening:  Vision Screening: Recommended annual ophthalmology exams for early detection of glaucoma and other disorders of the eye. Is  the patient up to date with their annual eye exam?  Yes  Who is the provider or what is the name of the office in which the patient attends annual eye exams? [facility]  Dental Screening: Recommended annual dental exams for proper oral hygiene  Community Resource Referral / Chronic Care Management: CRR required this visit?  No   CCM required this visit?  No   Plan:    I have personally reviewed and noted the following in the patient's chart:   Medical and social history Use of alcohol, tobacco or illicit drugs  Current medications and supplements including opioid prescriptions. Patient is not currently taking opioid prescriptions. Functional ability and status Nutritional status Physical activity Advanced directives List of other physicians Hospitalizations, surgeries, and ER visits in previous 12 months Vitals Screenings to include cognitive, depression, and falls Referrals and appointments  In addition, I have reviewed and discussed with patient certain preventive protocols, quality metrics, and best practice recommendations. A written personalized care plan for preventive services as well as general preventive health recommendations were provided to patient.   Angeline Fredericks, LPN   [id number]   After Visit Summary: (MyChart) Due to this being a telephonic visit, the after visit summary with patients personalized plan was offered to patient via MyChart   Notes: Nothing significant to report at this time.

## 2024-10-05 ENCOUNTER — Ambulatory Visit
Admission: RE | Admit: 2024-10-05 | Discharge: 2024-10-05 | Disposition: A | Discharge status: Home / Self Care | Source: Ambulatory Visit | Attending: Family | Admitting: Family

## 2024-10-05 ENCOUNTER — Ambulatory Visit: Payer: Self-pay | Admitting: Family

## 2024-10-05 ENCOUNTER — Ambulatory Visit (INDEPENDENT_AMBULATORY_CARE_PROVIDER_SITE_OTHER): Admitting: Family

## 2024-10-05 ENCOUNTER — Other Ambulatory Visit
Admission: RE | Admit: 2024-10-05 | Discharge: 2024-10-05 | Disposition: A | Discharge status: Home / Self Care | Source: Ambulatory Visit | Attending: Family | Admitting: Family

## 2024-10-05 ENCOUNTER — Encounter: Payer: Self-pay | Admitting: Family

## 2024-10-05 VITALS — BP 136/64 | HR 71 | Temp 98.1°F | Ht 72.0 in | Wt 176.6 lb

## 2024-10-05 DIAGNOSIS — R109 Unspecified abdominal pain: Secondary | ICD-10-CM

## 2024-10-05 DIAGNOSIS — R899 Unspecified abnormal finding in specimens from other organs, systems and tissues: Secondary | ICD-10-CM

## 2024-10-05 DIAGNOSIS — N200 Calculus of kidney: Secondary | ICD-10-CM | POA: Diagnosis present

## 2024-10-05 DIAGNOSIS — M549 Dorsalgia, unspecified: Secondary | ICD-10-CM | POA: Insufficient documentation

## 2024-10-05 DIAGNOSIS — M545 Low back pain, unspecified: Secondary | ICD-10-CM

## 2024-10-05 DIAGNOSIS — Z9889 Other specified postprocedural states: Secondary | ICD-10-CM | POA: Insufficient documentation

## 2024-10-05 DIAGNOSIS — R0789 Other chest pain: Secondary | ICD-10-CM | POA: Diagnosis not present

## 2024-10-05 DIAGNOSIS — E119 Type 2 diabetes mellitus without complications: Secondary | ICD-10-CM

## 2024-10-05 LAB — POCT URINALYSIS DIPSTICK
Bilirubin, UA: NEGATIVE
Glucose, UA: NEGATIVE
Ketones, UA: NEGATIVE
Nitrite, UA: NEGATIVE
Protein, UA: NEGATIVE
Spec Grav, UA: <=1.005 — AB (ref 1.010–1.025)
Urobilinogen, UA: 0.2 U/dL — AB
pH, UA: 5 (ref 5.0–8.0)

## 2024-10-05 LAB — URINALYSIS, ROUTINE W REFLEX MICROSCOPIC
Bilirubin Urine: NEGATIVE
Hgb urine dipstick: NEGATIVE
Ketones, ur: NEGATIVE
Nitrite: NEGATIVE
Specific Gravity, Urine: >=1.03 — AB (ref 1.000–1.030)
Total Protein, Urine: NEGATIVE
Urine Glucose: 250 — AB
Urobilinogen, UA: 0.2 (ref 0.0–1.0)
pH: 5.5 (ref 5.0–8.0)

## 2024-10-05 LAB — CBC WITH DIFFERENTIAL/PLATELET
Abs Immature Granulocytes: 0.03 K/uL (ref 0.00–0.07)
Basophils Absolute: 0 K/uL (ref 0.0–0.1)
Basophils Relative: 0 %
Eosinophils Absolute: 0.1 K/uL (ref 0.0–0.5)
Eosinophils Relative: 2 %
HCT: 34.3 % — ABNORMAL LOW (ref 36.0–46.0)
Hemoglobin: 11.7 g/dL — ABNORMAL LOW (ref 12.0–15.0)
Immature Granulocytes: 1 %
Lymphocytes Relative: 25 %
Lymphs Abs: 1.5 K/uL (ref 0.7–4.0)
MCH: 29.3 pg (ref 26.0–34.0)
MCHC: 34.1 g/dL (ref 30.0–36.0)
MCV: 85.8 fL (ref 80.0–100.0)
Monocytes Absolute: 0.4 K/uL (ref 0.1–1.0)
Monocytes Relative: 7 %
Neutro Abs: 4 K/uL (ref 1.7–7.7)
Neutrophils Relative %: 65 %
Platelets: 166 K/uL (ref 150–400)
RBC: 4 MIL/uL (ref 3.87–5.11)
RDW: 12.7 % (ref 11.5–15.5)
WBC: 6 K/uL (ref 4.0–10.5)
nRBC: 0 % (ref 0.0–0.2)

## 2024-10-05 LAB — COMPREHENSIVE METABOLIC PANEL WITH GFR
ALT: 17 U/L (ref 0–44)
AST: 22 U/L (ref 15–41)
Albumin: 3.9 g/dL (ref 3.5–5.0)
Alkaline Phosphatase: 66 U/L (ref 38–126)
Anion gap: 11 (ref 5–15)
BUN: 19 mg/dL (ref 8–23)
CO2: 26 mmol/L (ref 22–32)
Calcium: 9.1 mg/dL (ref 8.9–10.3)
Chloride: 102 mmol/L (ref 98–111)
Creatinine, Ser: 0.78 mg/dL (ref 0.44–1.00)
GFR, Estimated: >60 mL/min (ref >60)
Glucose, Bld: 112 mg/dL — ABNORMAL HIGH (ref 70–99)
Potassium: 4.1 mmol/L (ref 3.5–5.1)
Sodium: 139 mmol/L (ref 135–145)
Total Bilirubin: 0.6 mg/dL (ref 0.0–1.2)
Total Protein: 7.3 g/dL (ref 6.5–8.1)

## 2024-10-05 LAB — TROPONIN I (HIGH SENSITIVITY): Troponin I (High Sensitivity): 3 ng/L (ref <18)

## 2024-10-05 LAB — FERRITIN: Ferritin: 23 ng/mL (ref 11–307)

## 2024-10-05 NOTE — Assessment & Plan Note (Addendum)
 Pain has improved as of today.  Differential includes muscular spasm, renal stone.  Less likely GERD.  Chest pain is not exertional.  Obtained EKG, showing sinus rhythm.  Compared to 05/06/2022 ,03/26/2022 without significant changes .  Pending urinalysis, stat troponin, stat chest x-ray and stat abdominal x-ray.  Advised patient to remain incredibly vigilant during this workup and to present to emergency room for reevaluation  if symptoms persist or change.

## 2024-10-05 NOTE — Progress Notes (Signed)
 Assessment & Plan:  Acute left-sided low back pain without sciatica Assessment & Plan: Pain has improved as of today.  Differential includes muscular spasm, renal stone.  Less likely GERD.  Chest pain is not exertional.  Obtained EKG, showing sinus rhythm.  Compared to 05/06/2022 ,03/26/2022 without significant changes .  Pending urinalysis, stat troponin, stat chest x-ray and stat abdominal x-ray.  Advised patient to remain incredibly vigilant during this workup and to present to emergency room for reevaluation  if symptoms persist or change.   Orders: -     DG Abd 1 View; Future -     Urinalysis, Routine w reflex microscopic -     DG Chest 2 View; Future -     Troponin I (High Sensitivity); Future -     Comprehensive metabolic panel with GFR; Future -     CBC with Differential/Platelet; Future -     Ferritin; Future -     EKG 12-Lead     Return precautions given.   Risks, benefits, and alternatives of the medications and treatment plan prescribed today were discussed, and patient expressed understanding.   Education regarding symptom management and diagnosis given to patient on AVS either electronically or printed.  No follow-ups on file.  [name], FNP  Subjective:    Patient ID: Danielle Dickerson, female    DOB: [birth year], 69 y.o.   MRN: [id number]  CC: Danielle Dickerson is a 68 y.o. female who presents today for an acute visit.    HPI: HPI Discussed the use of AI scribe software for clinical note transcription with the patient, who gave verbal consent to proceed.  History of Present Illness   Danielle Dickerson is a 68 year old female who presents with back pain radiating to the chest.  She has been experiencing back pain for approximately seven days, which began after engaging in physical activities such as organizing a storage unit and lifting boxes. The pain is located in the upper back and radiates into the chest, and she describes discomfort associated with wearing  her bra. The pain is not severe and is not exacerbated by physical exertion such as walking or using a Peloton bike. The pain is described as more superficial and not deep.   She experiences episodes of nausea, not changed from prior. She has a history of gallbladder removal. She reports that the pain reminds her of symptoms she had when she experienced gallbladder issues in the past. Additionally, she has experienced numbness and awareness of symptom left arm, but not severe pain.  She has a history of kidney stones and describes having similar symptoms of nausea and tiredness as she did during previous episodes. She notes intermittent pressure in the groin area, which sometimes resolves on its own.  Her dietary habits include avoiding certain foods that exacerbate her symptoms, such as spinach artichoke dip and bagged lettuce, while she tolerates foods like cereal and bananas well. She takes pantoprazole  for reflux and occasionally uses Pepcid and anti-nausea medication.  She takes iron supplements due to a history of anemia and B12 deficiency, along with vitamin D3. She has not noticed significant changes in her symptoms with these medications.  She has used Bayer aspirin during a particularly stressful period when her daughter was receiving rabies shots after being bitten by cat during this time period, which she believes may have contributed to her symptoms.       Compliant with protonix  20mg  and pepcid ac 20mg  every day.  CT renal 07/13/2024 Status post cholecystectomy.  The pancreatic ductal dilatation or surrounding inflammatory changes.  Adrenal glands are unremarkable.  No hydronephrosis or renal obstruction. Status post hysterectomy.  No adnexal masses. Bland urine culture 07/13/2024  X-ray cervical spine 10/29/2022 Multilevel cervical spondylosis appears worst at C4-5 and C5-6.   H/o renal stone Celiac abs neg 06/29/21  Colonoscopy up-to-date 01/2021, Dr. Unk, Repeat in 10  years Allergies: Pneumovax 23 [pneumococcal vac polyvalent] and Prevnar 13 [pneumococcal 13-val conj vacc] Current Outpatient Medications on File Prior to Visit  Medication Sig Dispense Refill   Accu-Chek FastClix Lancets MISC Used to check blood sugars twice a day. 100 each 3   aspirin EC 81 MG tablet Take 81 mg by mouth daily.     atorvastatin  (LIPITOR) 40 MG tablet TAKE 1 TABLET BY MOUTH EVERY DAY 90 tablet 1   Blood Glucose Monitoring Suppl (ACCU-CHEK GUIDE ME) w/Device KIT USE AS DIRECTED 1 kit 0   Calcium -Vitamin D -Vitamin K (VIACTIV CALCIUM  PLUS D PO) Take 1 tablet by mouth daily.     Cholecalciferol (VITAMIN D3 PO) Take 400 mg by mouth daily.     citalopram  (CELEXA ) 20 MG tablet TAKE 1 TABLET BY MOUTH EVERY DAY 90 tablet 2   docusate sodium  (COLACE) 100 MG capsule Take 100 mg by mouth daily.     famotidine (PEPCID) 20 MG tablet Take 20 mg by mouth at bedtime.     ferrous sulfate 325 (65 FE) MG EC tablet Take 325 mg by mouth 3 (three) times daily with meals.     glucose blood (ACCU-CHEK AVIVA PLUS) test strip Used to check blood sugars twice a day. 100 each 5   hydrOXYzine  (ATARAX ) 10 MG tablet TAKE 1-2 TABLETS (10-20 MG TOTAL) BY MOUTH 2 (TWO) TIMES DAILY AS NEEDED FOR ANXIETY. 270 tablet 2   Insulin  Pen Needle (PEN NEEDLES) 32G X 4 MM MISC Used to give Victoza  injections. 100 each 1   Lancets (ONETOUCH ULTRASOFT) lancets Used to check blood sugars once a day. 100 each 4   lisinopril  (ZESTRIL ) 5 MG tablet TAKE 1 TABLET (5 MG TOTAL) BY MOUTH DAILY. 90 tablet 1   metFORMIN  (GLUCOPHAGE ) 1000 MG tablet TAKE 1 TABLET (1,000 MG TOTAL) BY MOUTH TWICE A DAY WITH FOOD 180 tablet 1   Multiple Vitamins-Minerals (CENTRUM SILVER ADULT 50+ PO) Take 1 tablet by mouth daily.     ondansetron  (ZOFRAN  ODT) 4 MG disintegrating tablet Take 1 tablet (4 mg total) by mouth every 8 (eight) hours as needed. 30 tablet 1   pantoprazole  (PROTONIX ) 20 MG tablet Take 1 tablet (20 mg total) by mouth daily.      Probiotic Product (PROBIOTIC DAILY PO) Take by mouth daily.     Semaglutide , 1 MG/DOSE, (OZEMPIC , 1 MG/DOSE,) 4 MG/3ML SOPN INJECT 1 MG INTO THE SKIN ONE TIME PER WEEK 6 mL 1   tamsulosin  (FLOMAX ) 0.4 MG CAPS capsule Take 1 capsule (0.4 mg total) by mouth daily. (Patient not taking: Reported on 09/26/2024) 15 capsule 3   No current facility-administered medications on file prior to visit.    Review of Systems  Constitutional:  Negative for chills and fever.  Respiratory:  Negative for cough and shortness of breath.   Cardiovascular:  Positive for chest pain. Negative for palpitations.  Gastrointestinal:  Negative for nausea and vomiting.  Musculoskeletal:  Positive for back pain.  Neurological:  Negative for numbness.      Objective:    BP 136/64   Pulse 71  Temp 98.1 F (36.7 C) (Oral)   Ht 6' (1.829 m)   Wt 176 lb 9.6 oz (80.1 kg)   SpO2 98%   BMI 23.95 kg/m   BP Readings from Last 3 Encounters:  10/05/24 136/64  09/03/24 130/78  07/13/24 138/72   Wt Readings from Last 3 Encounters:  10/05/24 176 lb 9.6 oz (80.1 kg)  09/26/24 177 lb (80.3 kg)  09/03/24 177 lb 3.2 oz (80.4 kg)    Physical Exam Vitals reviewed.  Constitutional:      Appearance: Normal appearance. She is well-developed.  Eyes:     Conjunctiva/sclera: Conjunctivae normal.  Cardiovascular:     Rate and Rhythm: Normal rate and regular rhythm.     Pulses: Normal pulses.     Heart sounds: Normal heart sounds.  Pulmonary:     Effort: Pulmonary effort is normal.     Breath sounds: Normal breath sounds. No wheezing, rhonchi or rales.  Chest:     Chest wall: No tenderness.  Abdominal:     General: Bowel sounds are normal. There is no distension.     Palpations: Abdomen is soft. Abdomen is not rigid. There is no fluid wave or mass.     Tenderness: There is no abdominal tenderness. There is no right CVA tenderness, left CVA tenderness, guarding or rebound.  Musculoskeletal:     Left shoulder:  Tenderness (over posterior scapula) present. No swelling or bony tenderness. Normal range of motion. Normal strength. Normal pulse.     Comments: Left Shoulder:  Left trapezius tenderness No asymmetry of shoulders when comparing right and left.No pain with palpation over glenohumeral joint lines, C[place] joint, AC joint, or bicipital groove. No pain with internal and external rotation. No pain with resisted lateral extension .   Negative active painful arc sign. Negative passive arc ( Neer's). Negative drop arm.   No pain, swelling, or ecchymosis noted over long head of biceps.  No bicep pain with resisted flexion and extension.  Negative speeds test. Negative Popeye's sign.   Strength and sensation normal BUE's.   Skin:    General: Skin is warm and dry.     Comments: No rash over back.  Neurological:     Mental Status: She is alert.  Psychiatric:        Speech: Speech normal.        Behavior: Behavior normal.        Thought Content: Thought content normal.

## 2024-10-05 NOTE — Patient Instructions (Signed)
 Please go directly to [facility] medical mall and have chest x-ray, abdominal x-ray and stat labs.  I am closely watching her troponin.  If troponin were to be elevated, you will need to go immediately to the emergency room.  Please stay very vigilant over the weekend if pain were to change or new symptoms develop, please have a low threshold for reevaluation.  I want you to be safe.

## 2024-10-05 NOTE — Addendum Note (Signed)
 Addended by: MORGAN LEVORA RAMAN on: 10/05/2024 01:53 PM   Modules accepted: Orders

## 2024-10-06 ENCOUNTER — Other Ambulatory Visit: Payer: Self-pay | Admitting: Family

## 2024-10-06 DIAGNOSIS — I10 Essential (primary) hypertension: Secondary | ICD-10-CM

## 2024-10-06 DIAGNOSIS — K219 Gastro-esophageal reflux disease without esophagitis: Secondary | ICD-10-CM

## 2024-10-06 DIAGNOSIS — E119 Type 2 diabetes mellitus without complications: Secondary | ICD-10-CM

## 2024-10-08 ENCOUNTER — Other Ambulatory Visit: Payer: Self-pay | Admitting: Family

## 2024-10-08 ENCOUNTER — Telehealth: Payer: Self-pay

## 2024-10-08 ENCOUNTER — Other Ambulatory Visit: Payer: Self-pay

## 2024-10-08 DIAGNOSIS — R899 Unspecified abnormal finding in specimens from other organs, systems and tissues: Secondary | ICD-10-CM

## 2024-10-08 DIAGNOSIS — R9389 Abnormal findings on diagnostic imaging of other specified body structures: Secondary | ICD-10-CM

## 2024-10-08 NOTE — Telephone Encounter (Signed)
 Pt has been scheduled disregard

## 2024-10-08 NOTE — Telephone Encounter (Signed)
 Per provider   Lab needed to be sch in 8 wks   (Orders are in)

## 2024-10-12 ENCOUNTER — Other Ambulatory Visit (INDEPENDENT_AMBULATORY_CARE_PROVIDER_SITE_OTHER)

## 2024-10-12 DIAGNOSIS — R899 Unspecified abnormal finding in specimens from other organs, systems and tissues: Secondary | ICD-10-CM

## 2024-10-12 LAB — URINALYSIS, ROUTINE W REFLEX MICROSCOPIC
Bilirubin Urine: NEGATIVE
Hgb urine dipstick: NEGATIVE
Ketones, ur: NEGATIVE
Leukocytes,Ua: NEGATIVE
Nitrite: NEGATIVE
Specific Gravity, Urine: 1.02 (ref 1.000–1.030)
Total Protein, Urine: NEGATIVE
Urine Glucose: NEGATIVE
Urobilinogen, UA: 0.2 (ref 0.0–1.0)
pH: 5.5 (ref 5.0–8.0)

## 2024-10-16 ENCOUNTER — Ambulatory Visit: Payer: Self-pay | Admitting: Family

## 2024-10-16 NOTE — Telephone Encounter (Unsigned)
 Copied from CRM [id number]. Topic: Clinical - Lab/Test Results >> Oct 16, 2024 12:13 PM Brittany M wrote: Reason for CRM: Patient wanting nurse to contact her about lab results for the urinalysis on 11/14

## 2024-10-19 ENCOUNTER — Ambulatory Visit (INDEPENDENT_AMBULATORY_CARE_PROVIDER_SITE_OTHER)

## 2024-10-19 DIAGNOSIS — E538 Deficiency of other specified B group vitamins: Secondary | ICD-10-CM | POA: Diagnosis not present

## 2024-10-19 MED ORDER — CYANOCOBALAMIN 1000 MCG/ML IJ SOLN
1000.0000 ug | Freq: Once | INTRAMUSCULAR | Status: AC
  Administered 2024-10-19: 1000 ug via INTRAMUSCULAR

## 2024-10-19 NOTE — Progress Notes (Signed)
 After obtaining consent, and per orders of [name], FNP injection of B-12 injection given IM in R Deltoid by Doyce Croak, CMA. Patient tolerated injection well.

## 2024-10-22 ENCOUNTER — Telehealth: Payer: Self-pay | Admitting: Family

## 2024-10-22 ENCOUNTER — Encounter: Payer: Self-pay | Admitting: Family

## 2024-10-22 NOTE — Telephone Encounter (Unsigned)
 Copied from CRM [id number]. Topic: Clinical - Prescription Issue >> Oct 22, 2024  4:04 PM Frederich PARAS wrote: Reason for CRM: pt says she is apart of the novo disk program 2025,she says a form and it was sent back and she says we  put it should be a yr prescrition and not the 4 boxes, They need it corrected to 4 boxes so she could get them and it has to be done by 11/30  Website is [email or web address] she was given this by the worker

## 2024-10-23 ENCOUNTER — Encounter: Payer: Self-pay | Admitting: Pharmacist

## 2024-10-23 NOTE — Progress Notes (Signed)
 Patient Assistance Program (PAP) Application   Manufacturer: Novo Nordisk    Refill request Medication(s): Ozempic  1 mg, 4 boxes  Refill form complete, signed by PCP and faxed to [employer] by PharmD Placed in scan tray for upload to chart

## 2024-10-23 NOTE — Telephone Encounter (Signed)
 Spoke to pt informed her that form has been signed and faxed over pt verbalizes understanding

## 2024-10-23 NOTE — Telephone Encounter (Signed)
 SPOKE TO PT SEE PREVIOUS TELEPHONE NOTE FOR 10/23/24

## 2024-10-26 ENCOUNTER — Other Ambulatory Visit: Payer: Self-pay | Admitting: Family

## 2024-10-29 ENCOUNTER — Telehealth: Admitting: Family Medicine

## 2024-10-29 DIAGNOSIS — J069 Acute upper respiratory infection, unspecified: Secondary | ICD-10-CM

## 2024-10-29 MED ORDER — OSELTAMIVIR PHOSPHATE 75 MG PO CAPS: 75.0000 mg | ORAL_CAPSULE | Freq: Two times a day (BID) | ORAL | 0 refills | Status: AC

## 2024-10-29 MED ORDER — IPRATROPIUM BROMIDE 0.03 % NA SOLN: 2.0000 | Freq: Two times a day (BID) | NASAL | 0 refills | Status: AC

## 2024-10-29 MED ORDER — BENZONATATE 100 MG PO CAPS: 200.0000 mg | ORAL_CAPSULE | Freq: Three times a day (TID) | ORAL | 0 refills | Status: DC | PRN

## 2024-10-29 NOTE — Patient Instructions (Signed)
 [name], thank you for joining Chiquita CHRISTELLA Barefoot, NP for today's virtual visit.  While this provider is not your primary care provider (PCP), if your PCP is located in our provider database this encounter information will be shared with them immediately following your visit.   A [facility] MyChart account gives you access to today's visit and all your visits, tests, and labs performed at [facility]  click here if you don't have a [facility] MyChart account or go to [email or web address]  Consent: (Patient) Danielle Dickerson provided verbal consent for this virtual visit at the beginning of the encounter.  Current Medications:  Current Outpatient Medications:    benzonatate  (TESSALON ) 100 MG capsule, Take 2 capsules (200 mg total) by mouth 3 (three) times daily as needed for cough., Disp: 30 capsule, Rfl: 0   ipratropium (ATROVENT ) 0.03 % nasal spray, Place 2 sprays into both nostrils every 12 (twelve) hours., Disp: 30 mL, Rfl: 0   oseltamivir  (TAMIFLU ) 75 MG capsule, Take 1 capsule (75 mg total) by mouth 2 (two) times daily for 5 days., Disp: 10 capsule, Rfl: 0   Accu-Chek FastClix Lancets MISC, Used to check blood sugars twice a day., Disp: 100 each, Rfl: 3   aspirin EC 81 MG tablet, Take 81 mg by mouth daily., Disp: , Rfl:    atorvastatin  (LIPITOR) 40 MG tablet, TAKE 1 TABLET BY MOUTH EVERY DAY, Disp: 90 tablet, Rfl: 1   Blood Glucose Monitoring Suppl (ACCU-CHEK GUIDE ME) w/Device KIT, USE AS DIRECTED, Disp: 1 kit, Rfl: 0   Calcium -Vitamin D -Vitamin K (VIACTIV CALCIUM  PLUS D PO), Take 1 tablet by mouth daily., Disp: , Rfl:    Cholecalciferol (VITAMIN D3 PO), Take 400 mg by mouth daily., Disp: , Rfl:    citalopram  (CELEXA ) 20 MG tablet, TAKE 1 TABLET BY MOUTH EVERY DAY, Disp: 90 tablet, Rfl: 2   docusate sodium  (COLACE) 100 MG capsule, Take 100 mg by mouth daily., Disp: , Rfl:    famotidine (PEPCID) 20 MG tablet, Take 20 mg by mouth at bedtime., Disp: , Rfl:    ferrous  sulfate 325 (65 FE) MG EC tablet, Take 325 mg by mouth 3 (three) times daily with meals., Disp: , Rfl:    glucose blood (ACCU-CHEK AVIVA PLUS) test strip, Used to check blood sugars twice a day., Disp: 100 each, Rfl: 5   hydrOXYzine  (ATARAX ) 10 MG tablet, TAKE 1-2 TABLETS (10-20 MG TOTAL) BY MOUTH 2 (TWO) TIMES DAILY AS NEEDED FOR ANXIETY., Disp: 270 tablet, Rfl: 2   Insulin  Pen Needle (PEN NEEDLES) 32G X 4 MM MISC, Used to give Victoza  injections., Disp: 100 each, Rfl: 1   Lancets (ONETOUCH ULTRASOFT) lancets, Used to check blood sugars once a day., Disp: 100 each, Rfl: 4   lisinopril  (ZESTRIL ) 5 MG tablet, TAKE 1 TABLET (5 MG TOTAL) BY MOUTH DAILY., Disp: 90 tablet, Rfl: 1   metFORMIN  (GLUCOPHAGE ) 1000 MG tablet, TAKE 1 TABLET (1,000 MG TOTAL) BY MOUTH TWICE A DAY WITH FOOD, Disp: 180 tablet, Rfl: 1   Multiple Vitamins-Minerals (CENTRUM SILVER ADULT 50+ PO), Take 1 tablet by mouth daily., Disp: , Rfl:    ondansetron  (ZOFRAN  ODT) 4 MG disintegrating tablet, Take 1 tablet (4 mg total) by mouth every 8 (eight) hours as needed., Disp: 30 tablet, Rfl: 1   pantoprazole  (PROTONIX ) 20 MG tablet, TAKE 1 TABLET (20 MG TOTAL) BY MOUTH 2 (TWO) TIMES DAILY BEFORE A MEAL., Disp: 180 tablet, Rfl: 1   Probiotic Product (PROBIOTIC DAILY  PO), Take by mouth daily., Disp: , Rfl:    Semaglutide , 1 MG/DOSE, (OZEMPIC , 1 MG/DOSE,) 4 MG/3ML SOPN, INJECT 1 MG INTO THE SKIN ONE TIME PER WEEK, Disp: 6 mL, Rfl: 1   tamsulosin  (FLOMAX ) 0.4 MG CAPS capsule, Take 1 capsule (0.4 mg total) by mouth daily. (Patient not taking: Reported on 09/26/2024), Disp: 15 capsule, Rfl: 3   Medications ordered in this encounter:  Meds ordered this encounter  Medications   oseltamivir (TAMIFLU) 75 MG capsule    Sig: Take 1 capsule (75 mg total) by mouth 2 (two) times daily for 5 days.    Dispense:  10 capsule    Refill:  0    Supervising Provider:   LAMPTEY, PHILIP O [[id number]]   benzonatate  (TESSALON ) 100 MG capsule    Sig: Take 2 capsules  (200 mg total) by mouth 3 (three) times daily as needed for cough.    Dispense:  30 capsule    Refill:  0    Supervising Provider:   LAMPTEY, PHILIP O [[id number]]   ipratropium (ATROVENT ) 0.03 % nasal spray    Sig: Place 2 sprays into both nostrils every 12 (twelve) hours.    Dispense:  30 mL    Refill:  0    Supervising Provider:   BLAISE ALEENE KIDD [[id number]]     *If you need refills on other medications prior to your next appointment, please contact your pharmacy*  Follow-Up: Call back or seek an in-person evaluation if the symptoms worsen or if the condition fails to improve as anticipated.  [facility] [phone]  Other Instructions  URI recommendations: - Increased rest - Increasing Fluids - Acetaminophen  / ibuprofen as needed for fever/pain.  - Salt water  gargling, chloraseptic spray and throat lozenges - Mucinex if mucus is present and increasing.  - Saline nasal spray if congestion or if nasal passages feel dry. - Humidifying the air.    If you have been instructed to have an in-person evaluation today at a local Urgent Care facility, please use the link below. It will take you to a list of all of our available [facility], including address, phone number and hours of operation. Please do not delay care.  [facility]  If you or a family member do not have a primary care provider, use the link below to schedule a visit and establish care. When you choose a [facility] primary care physician or advanced practice provider, you gain a long-term partner in health. Find a Primary Care Provider  Learn more about [facility] in-office and virtual care options: [facility] - Get Care Now

## 2024-10-29 NOTE — Progress Notes (Signed)
 Virtual Visit Consent   Danielle Dickerson, you are scheduled for a virtual visit with a [facility] provider today. Just as with appointments in the office, your consent must be obtained to participate. Your consent will be active for this visit and any virtual visit you may have with one of our providers in the next 365 days. If you have a MyChart account, a copy of this consent can be sent to you electronically.  As this is a virtual visit, video technology does not allow for your provider to perform a traditional examination. This may limit your provider's ability to fully assess your condition. If your provider identifies any concerns that need to be evaluated in person or the need to arrange testing (such as labs, EKG, etc.), we will make arrangements to do so. Although advances in technology are sophisticated, we cannot ensure that it will always work on either your end or our end. If the connection with a video visit is poor, the visit may have to be switched to a telephone visit. With either a video or telephone visit, we are not always able to ensure that we have a secure connection.  By engaging in this virtual visit, you consent to the provision of healthcare and authorize for your insurance to be billed (if applicable) for the services provided during this visit. Depending on your insurance coverage, you may receive a charge related to this service.  I need to obtain your verbal consent now. Are you willing to proceed with your visit today? Danielle Dickerson has provided verbal consent on 10/29/2024 for a virtual visit (video or telephone). Danielle CHRISTELLA Barefoot, NP  Date: 10/29/2024 11:28 AM   Virtual Visit via Video Note   I, Danielle Dickerson, connected with  Danielle Dickerson  ([id number], [birth year]) on 10/29/24 at 11:15 AM EST by a video-enabled telemedicine application and verified that I am speaking with the correct person using two identifiers.  Location: Patient: Virtual Visit Location Patient:  Home Provider: Virtual Visit Location Provider: Home Office   I discussed the limitations of evaluation and management by telemedicine and the availability of in person appointments. The patient expressed understanding and agreed to proceed.    History of Present Illness: Danielle Dickerson is a 68 y.o. who identifies as a female who was assigned female at birth, and is being seen today for cold symptoms  Woke up feeling bad today with headache, congestion, sore throat. Associated symptoms are some mild nausea yesterday Modifying factors are ny quil  Denies chest pain, shortness of breath, fevers, chills  Exposure to sick contacts- children have been sick  COVID test: neg  Vaccines: none    Problems:  Patient Active Problem List   Diagnosis Date Noted   Acute left-sided back pain 10/05/2024   Gross hematuria 07/13/2024   Generalized abdominal pain 07/13/2024   Conjunctivitis 01/31/2024   Anxiety and depression 01/31/2024   Blister 08/04/2023   Bronchitis 03/07/2023   Right knee pain 12/07/2022   Skin lesion 07/28/2022   Arm pain 07/28/2022   LLL pneumonia 05/10/2022   Medicare annual wellness visit, initial 04/04/2022   HTN (hypertension) 01/22/2022   Right ureteral stone 09/07/2021   Bilious emesis 08/18/2021   Renal stone 08/18/2021   Pernicious anemia 07/03/2021   Initial Medicare annual wellness visit    Atypical squamous cell changes of undetermined significance (ASCUS) on cervical cytology with negative high risk human papilloma virus (HPV) test result 05/08/2020   DM (diabetes mellitus) (  HCC) 01/30/2020   HLD (hyperlipidemia) 01/30/2020   GERD (gastroesophageal reflux disease) 01/30/2020    Allergies:  Allergies  Allergen Reactions   Pneumovax 23 [Pneumococcal Vac Polyvalent] Swelling    Causes arm swelling, redness.   Prevnar 13 [Pneumococcal 13-Val Conj Vacc] Swelling   Medications:  Current Outpatient Medications:    Accu-Chek FastClix Lancets MISC, Used to  check blood sugars twice a day., Disp: 100 each, Rfl: 3   aspirin EC 81 MG tablet, Take 81 mg by mouth daily., Disp: , Rfl:    atorvastatin  (LIPITOR) 40 MG tablet, TAKE 1 TABLET BY MOUTH EVERY DAY, Disp: 90 tablet, Rfl: 1   Blood Glucose Monitoring Suppl (ACCU-CHEK GUIDE ME) w/Device KIT, USE AS DIRECTED, Disp: 1 kit, Rfl: 0   Calcium -Vitamin D -Vitamin K (VIACTIV CALCIUM  PLUS D PO), Take 1 tablet by mouth daily., Disp: , Rfl:    Cholecalciferol (VITAMIN D3 PO), Take 400 mg by mouth daily., Disp: , Rfl:    citalopram  (CELEXA ) 20 MG tablet, TAKE 1 TABLET BY MOUTH EVERY DAY, Disp: 90 tablet, Rfl: 2   docusate sodium  (COLACE) 100 MG capsule, Take 100 mg by mouth daily., Disp: , Rfl:    famotidine (PEPCID) 20 MG tablet, Take 20 mg by mouth at bedtime., Disp: , Rfl:    ferrous sulfate 325 (65 FE) MG EC tablet, Take 325 mg by mouth 3 (three) times daily with meals., Disp: , Rfl:    glucose blood (ACCU-CHEK AVIVA PLUS) test strip, Used to check blood sugars twice a day., Disp: 100 each, Rfl: 5   hydrOXYzine  (ATARAX ) 10 MG tablet, TAKE 1-2 TABLETS (10-20 MG TOTAL) BY MOUTH 2 (TWO) TIMES DAILY AS NEEDED FOR ANXIETY., Disp: 270 tablet, Rfl: 2   Insulin  Pen Needle (PEN NEEDLES) 32G X 4 MM MISC, Used to give Victoza  injections., Disp: 100 each, Rfl: 1   Lancets (ONETOUCH ULTRASOFT) lancets, Used to check blood sugars once a day., Disp: 100 each, Rfl: 4   lisinopril  (ZESTRIL ) 5 MG tablet, TAKE 1 TABLET (5 MG TOTAL) BY MOUTH DAILY., Disp: 90 tablet, Rfl: 1   metFORMIN  (GLUCOPHAGE ) 1000 MG tablet, TAKE 1 TABLET (1,000 MG TOTAL) BY MOUTH TWICE A DAY WITH FOOD, Disp: 180 tablet, Rfl: 1   Multiple Vitamins-Minerals (CENTRUM SILVER ADULT 50+ PO), Take 1 tablet by mouth daily., Disp: , Rfl:    ondansetron  (ZOFRAN  ODT) 4 MG disintegrating tablet, Take 1 tablet (4 mg total) by mouth every 8 (eight) hours as needed., Disp: 30 tablet, Rfl: 1   pantoprazole  (PROTONIX ) 20 MG tablet, TAKE 1 TABLET (20 MG TOTAL) BY MOUTH 2  (TWO) TIMES DAILY BEFORE A MEAL., Disp: 180 tablet, Rfl: 1   Probiotic Product (PROBIOTIC DAILY PO), Take by mouth daily., Disp: , Rfl:    Semaglutide , 1 MG/DOSE, (OZEMPIC , 1 MG/DOSE,) 4 MG/3ML SOPN, INJECT 1 MG INTO THE SKIN ONE TIME PER WEEK, Disp: 6 mL, Rfl: 1   tamsulosin  (FLOMAX ) 0.4 MG CAPS capsule, Take 1 capsule (0.4 mg total) by mouth daily. (Patient not taking: Reported on 09/26/2024), Disp: 15 capsule, Rfl: 3  Observations/Objective: Patient is well-developed, well-nourished in no acute distress.  Resting comfortably  at home.  Head is normocephalic, atraumatic.  No labored breathing.  Speech is clear and coherent with logical content.  Patient is alert and oriented at baseline.    Assessment and Plan:   1. Viral URI with cough (Primary)  - oseltamivir (TAMIFLU) 75 MG capsule; Take 1 capsule (75 mg total) by mouth 2 (two)  times daily for 5 days.  Dispense: 10 capsule; Refill: 0 - benzonatate  (TESSALON ) 100 MG capsule; Take 2 capsules (200 mg total) by mouth 3 (three) times daily as needed for cough.  Dispense: 30 capsule; Refill: 0 - ipratropium (ATROVENT ) 0.03 % nasal spray; Place 2 sprays into both nostrils every 12 (twelve) hours.  Dispense: 30 mL; Refill: 0   URI recommendations: - Increased rest - Increasing Fluids - Acetaminophen  / ibuprofen as needed for fever/pain.  - Salt water  gargling, chloraseptic spray and throat lozenges - Mucinex if mucus is present and increasing.  - Saline nasal spray if congestion or if nasal passages feel dry. - Humidifying the air.     Reviewed side effects, risks and benefits of medication.    Patient acknowledged agreement and understanding of the plan.   Past Medical, Surgical, Social History, Allergies, and Medications have been Reviewed.   Follow Up Instructions: I discussed the assessment and treatment plan with the patient. The patient was provided an opportunity to ask questions and all were answered. The patient  agreed with the plan and demonstrated an understanding of the instructions.  A copy of instructions were sent to the patient via MyChart unless otherwise noted below.    The patient was advised to call back or seek an in-person evaluation if the symptoms worsen or if the condition fails to improve as anticipated.    Danielle CHRISTELLA Barefoot, NP

## 2024-10-30 ENCOUNTER — Other Ambulatory Visit (INDEPENDENT_AMBULATORY_CARE_PROVIDER_SITE_OTHER): Payer: Self-pay | Admitting: Nurse Practitioner

## 2024-10-30 DIAGNOSIS — I728 Aneurysm of other specified arteries: Secondary | ICD-10-CM

## 2024-10-31 ENCOUNTER — Telehealth: Payer: Self-pay | Admitting: *Deleted

## 2024-10-31 NOTE — Telephone Encounter (Signed)
 Patient notified via voicemail that Patient Assistance Medication are in the office & are ready for pick up.   Medication: Ozempic  4mg /47ml  Quantity: 2 boxes  Lot# [id number]  Exp: 11/28/2026

## 2024-11-01 ENCOUNTER — Encounter: Payer: Self-pay | Admitting: Family

## 2024-11-01 ENCOUNTER — Telehealth: Admitting: Physician Assistant

## 2024-11-01 DIAGNOSIS — B9789 Other viral agents as the cause of diseases classified elsewhere: Secondary | ICD-10-CM

## 2024-11-01 DIAGNOSIS — J019 Acute sinusitis, unspecified: Secondary | ICD-10-CM

## 2024-11-01 MED ORDER — PREDNISONE 10 MG (21) PO TBPK: ORAL_TABLET | ORAL | 0 refills | Status: DC

## 2024-11-01 MED ORDER — PROMETHAZINE-DM 6.25-15 MG/5ML PO SYRP: 5.0000 mL | ORAL_SOLUTION | Freq: Four times a day (QID) | ORAL | 0 refills | Status: DC | PRN

## 2024-11-01 NOTE — Progress Notes (Signed)
 Virtual Visit Consent   Danielle Dickerson, you are scheduled for a virtual visit with a [facility] provider today. Just as with appointments in the office, your consent must be obtained to participate. Your consent will be active for this visit and any virtual visit you may have with one of our providers in the next 365 days. If you have a MyChart account, a copy of this consent can be sent to you electronically.  As this is a virtual visit, video technology does not allow for your provider to perform a traditional examination. This may limit your provider's ability to fully assess your condition. If your provider identifies any concerns that need to be evaluated in person or the need to arrange testing (such as labs, EKG, etc.), we will make arrangements to do so. Although advances in technology are sophisticated, we cannot ensure that it will always work on either your end or our end. If the connection with a video visit is poor, the visit may have to be switched to a telephone visit. With either a video or telephone visit, we are not always able to ensure that we have a secure connection.  By engaging in this virtual visit, you consent to the provision of healthcare and authorize for your insurance to be billed (if applicable) for the services provided during this visit. Depending on your insurance coverage, you may receive a charge related to this service.  I need to obtain your verbal consent now. Are you willing to proceed with your visit today? Danielle Dickerson has provided verbal consent on 11/01/2024 for a virtual visit (video or telephone). Danielle Dickerson, [place]  Date: 11/01/2024 11:45 AM   Virtual Visit via Video Note   I, Danielle Dickerson, connected with  Danielle Dickerson  ([id number], May 16, 1956) on 11/01/24 at 11:30 AM EST by a video-enabled telemedicine application and verified that I am speaking with the correct person using two identifiers.  Location: Patient: Virtual Visit  Location Patient: Home Provider: Virtual Visit Location Provider: Home Office   I discussed the limitations of evaluation and management by telemedicine and the availability of in person appointments. The patient expressed understanding and agreed to proceed.    History of Present Illness: Danielle Dickerson is a 68 y.o. who identifies as a female who was assigned female at birth, and is being seen today for continued URI symptoms after recent visit on 12 1 at which time she was diagnosed with suspected influenza and started on a treatment course of Tamiflu, Tessalon  and a nasal steroid spray.  Endorses she still has some occasional low-grade fever, mainly at night.  Last night with Tmax of 100.  Still noting substantial fatigue.  Noting some increased nasal congestion and cough.  Notes the nasal spray helped slightly but the cough medication is not offering any relief.  Has tested for COVID multiple times at home as a precaution.  These have all been negative.  Denies chest pain or shortness of breath.  Has pressure without noted facial pain or tooth pain.     HPI: HPI  Problems:  Patient Active Problem List   Diagnosis Date Noted   Acute left-sided back pain 10/05/2024   Gross hematuria 07/13/2024   Generalized abdominal pain 07/13/2024   Conjunctivitis 01/31/2024   Anxiety and depression 01/31/2024   Blister 08/04/2023   Bronchitis 03/07/2023   Right knee pain 12/07/2022   Skin lesion 07/28/2022   Arm pain 07/28/2022   LLL pneumonia 05/10/2022  Medicare annual wellness visit, initial 04/04/2022   HTN (hypertension) 01/22/2022   Right ureteral stone 09/07/2021   Bilious emesis 08/18/2021   Renal stone 08/18/2021   Pernicious anemia 07/03/2021   Initial Medicare annual wellness visit    Atypical squamous cell changes of undetermined significance (ASCUS) on cervical cytology with negative high risk human papilloma virus (HPV) test result 05/08/2020   DM (diabetes mellitus) (HCC)  01/30/2020   HLD (hyperlipidemia) 01/30/2020   GERD (gastroesophageal reflux disease) 01/30/2020    Allergies:  Allergies  Allergen Reactions   Pneumovax 23 [Pneumococcal Vac Polyvalent] Swelling    Causes arm swelling, redness.   Prevnar 13 [Pneumococcal 13-Val Conj Vacc] Swelling   Medications:  Current Outpatient Medications:    predniSONE  (STERAPRED UNI-PAK 21 TAB) 10 MG (21) TBPK tablet, Take following package directions, Disp: 21 tablet, Rfl: 0   promethazine -dextromethorphan (PROMETHAZINE -DM) 6.25-15 MG/5ML syrup, Take 5 mLs by mouth 4 (four) times daily as needed for cough., Disp: 118 mL, Rfl: 0   Accu-Chek FastClix Lancets MISC, Used to check blood sugars twice a day., Disp: 100 each, Rfl: 3   aspirin EC 81 MG tablet, Take 81 mg by mouth daily., Disp: , Rfl:    atorvastatin  (LIPITOR) 40 MG tablet, TAKE 1 TABLET BY MOUTH EVERY DAY, Disp: 90 tablet, Rfl: 3   Blood Glucose Monitoring Suppl (ACCU-CHEK GUIDE ME) w/Device KIT, USE AS DIRECTED, Disp: 1 kit, Rfl: 0   Calcium -Vitamin D -Vitamin K (VIACTIV CALCIUM  PLUS D PO), Take 1 tablet by mouth daily., Disp: , Rfl:    Cholecalciferol (VITAMIN D3 PO), Take 400 mg by mouth daily., Disp: , Rfl:    citalopram  (CELEXA ) 20 MG tablet, TAKE 1 TABLET BY MOUTH EVERY DAY, Disp: 90 tablet, Rfl: 2   docusate sodium  (COLACE) 100 MG capsule, Take 100 mg by mouth daily., Disp: , Rfl:    famotidine (PEPCID) 20 MG tablet, Take 20 mg by mouth at bedtime., Disp: , Rfl:    ferrous sulfate 325 (65 FE) MG EC tablet, Take 325 mg by mouth 3 (three) times daily with meals., Disp: , Rfl:    glucose blood (ACCU-CHEK AVIVA PLUS) test strip, Used to check blood sugars twice a day., Disp: 100 each, Rfl: 5   hydrOXYzine  (ATARAX ) 10 MG tablet, TAKE 1-2 TABLETS (10-20 MG TOTAL) BY MOUTH 2 (TWO) TIMES DAILY AS NEEDED FOR ANXIETY., Disp: 270 tablet, Rfl: 2   Insulin  Pen Needle (PEN NEEDLES) 32G X 4 MM MISC, Used to give Victoza  injections., Disp: 100 each, Rfl: 1    ipratropium (ATROVENT ) 0.03 % nasal spray, Place 2 sprays into both nostrils every 12 (twelve) hours., Disp: 30 mL, Rfl: 0   Lancets (ONETOUCH ULTRASOFT) lancets, Used to check blood sugars once a day., Disp: 100 each, Rfl: 4   lisinopril  (ZESTRIL ) 5 MG tablet, TAKE 1 TABLET (5 MG TOTAL) BY MOUTH DAILY., Disp: 90 tablet, Rfl: 1   metFORMIN  (GLUCOPHAGE ) 1000 MG tablet, TAKE 1 TABLET (1,000 MG TOTAL) BY MOUTH TWICE A DAY WITH FOOD, Disp: 180 tablet, Rfl: 1   Multiple Vitamins-Minerals (CENTRUM SILVER ADULT 50+ PO), Take 1 tablet by mouth daily., Disp: , Rfl:    ondansetron  (ZOFRAN  ODT) 4 MG disintegrating tablet, Take 1 tablet (4 mg total) by mouth every 8 (eight) hours as needed., Disp: 30 tablet, Rfl: 1   oseltamivir (TAMIFLU) 75 MG capsule, Take 1 capsule (75 mg total) by mouth 2 (two) times daily for 5 days., Disp: 10 capsule, Rfl: 0   pantoprazole  (PROTONIX )  20 MG tablet, TAKE 1 TABLET (20 MG TOTAL) BY MOUTH 2 (TWO) TIMES DAILY BEFORE A MEAL., Disp: 180 tablet, Rfl: 1   Probiotic Product (PROBIOTIC DAILY PO), Take by mouth daily., Disp: , Rfl:    Semaglutide , 1 MG/DOSE, (OZEMPIC , 1 MG/DOSE,) 4 MG/3ML SOPN, INJECT 1 MG INTO THE SKIN ONE TIME PER WEEK, Disp: 6 mL, Rfl: 1   tamsulosin  (FLOMAX ) 0.4 MG CAPS capsule, Take 1 capsule (0.4 mg total) by mouth daily. (Patient not taking: Reported on 09/26/2024), Disp: 15 capsule, Rfl: 3  Observations/Objective: Patient is well-developed, well-nourished in no acute distress.  Resting comfortably  at home.  Head is normocephalic, atraumatic.  No labored breathing.  Speech is clear and coherent with logical content.  Patient is alert and oriented at baseline.   Assessment and Plan: 1. Acute viral sinusitis (Primary) - promethazine -dextromethorphan (PROMETHAZINE -DM) 6.25-15 MG/5ML syrup; Take 5 mLs by mouth 4 (four) times daily as needed for cough.  Dispense: 118 mL; Refill: 0 - predniSONE  (STERAPRED UNI-PAK 21 TAB) 10 MG (21) TBPK tablet; Take following  package directions  Dispense: 21 tablet; Refill: 0  Previously diagnosed with suspected influenza.  Multiple negative COVID test.  Has not actually tested for flu, but at this point we will have her continue the last couple of days of her Tamiflu.  Supportive measures and OTC medications reviewed.  Stop Tessalon .  Start Promethazine  DM for cough.  Will add on a steroid pack to reduce sinus and airway inflammation and hopefully expedite recovery.  Strict in person evaluation precautions reviewed with patient who voiced understanding.  Follow Up Instructions: I discussed the assessment and treatment plan with the patient. The patient was provided an opportunity to ask questions and all were answered. The patient agreed with the plan and demonstrated an understanding of the instructions.  A copy of instructions were sent to the patient via MyChart unless otherwise noted below.    The patient was advised to call back or seek an in-person evaluation if the symptoms worsen or if the condition fails to improve as anticipated.    Danielle Velma Lunger, PA-C

## 2024-11-01 NOTE — Patient Instructions (Signed)
 Danielle Dickerson, thank you for joining Danielle Velma Lunger, PA-C for today's virtual visit.  While this provider is not your primary care provider (PCP), if your PCP is located in our provider database this encounter information will be shared with them immediately following your visit.   A [facility] MyChart account gives you access to today's visit and all your visits, tests, and labs performed at [facility]  click here if you don't have a [facility] MyChart account or go to [email or web address]  Consent: (Patient) Danielle Dickerson provided verbal consent for this virtual visit at the beginning of the encounter.  Current Medications:  Current Outpatient Medications:    Accu-Chek FastClix Lancets MISC, Used to check blood sugars twice a day., Disp: 100 each, Rfl: 3   aspirin EC 81 MG tablet, Take 81 mg by mouth daily., Disp: , Rfl:    atorvastatin  (LIPITOR) 40 MG tablet, TAKE 1 TABLET BY MOUTH EVERY DAY, Disp: 90 tablet, Rfl: 3   benzonatate  (TESSALON ) 100 MG capsule, Take 2 capsules (200 mg total) by mouth 3 (three) times daily as needed for cough., Disp: 30 capsule, Rfl: 0   Blood Glucose Monitoring Suppl (ACCU-CHEK GUIDE ME) w/Device KIT, USE AS DIRECTED, Disp: 1 kit, Rfl: 0   Calcium -Vitamin D -Vitamin K (VIACTIV CALCIUM  PLUS D PO), Take 1 tablet by mouth daily., Disp: , Rfl:    Cholecalciferol (VITAMIN D3 PO), Take 400 mg by mouth daily., Disp: , Rfl:    citalopram  (CELEXA ) 20 MG tablet, TAKE 1 TABLET BY MOUTH EVERY DAY, Disp: 90 tablet, Rfl: 2   docusate sodium  (COLACE) 100 MG capsule, Take 100 mg by mouth daily., Disp: , Rfl:    famotidine (PEPCID) 20 MG tablet, Take 20 mg by mouth at bedtime., Disp: , Rfl:    ferrous sulfate 325 (65 FE) MG EC tablet, Take 325 mg by mouth 3 (three) times daily with meals., Disp: , Rfl:    glucose blood (ACCU-CHEK AVIVA PLUS) test strip, Used to check blood sugars twice a day., Disp: 100 each, Rfl: 5   hydrOXYzine  (ATARAX ) 10 MG  tablet, TAKE 1-2 TABLETS (10-20 MG TOTAL) BY MOUTH 2 (TWO) TIMES DAILY AS NEEDED FOR ANXIETY., Disp: 270 tablet, Rfl: 2   Insulin  Pen Needle (PEN NEEDLES) 32G X 4 MM MISC, Used to give Victoza  injections., Disp: 100 each, Rfl: 1   ipratropium (ATROVENT ) 0.03 % nasal spray, Place 2 sprays into both nostrils every 12 (twelve) hours., Disp: 30 mL, Rfl: 0   Lancets (ONETOUCH ULTRASOFT) lancets, Used to check blood sugars once a day., Disp: 100 each, Rfl: 4   lisinopril  (ZESTRIL ) 5 MG tablet, TAKE 1 TABLET (5 MG TOTAL) BY MOUTH DAILY., Disp: 90 tablet, Rfl: 1   metFORMIN  (GLUCOPHAGE ) 1000 MG tablet, TAKE 1 TABLET (1,000 MG TOTAL) BY MOUTH TWICE A DAY WITH FOOD, Disp: 180 tablet, Rfl: 1   Multiple Vitamins-Minerals (CENTRUM SILVER ADULT 50+ PO), Take 1 tablet by mouth daily., Disp: , Rfl:    ondansetron  (ZOFRAN  ODT) 4 MG disintegrating tablet, Take 1 tablet (4 mg total) by mouth every 8 (eight) hours as needed., Disp: 30 tablet, Rfl: 1   oseltamivir (TAMIFLU) 75 MG capsule, Take 1 capsule (75 mg total) by mouth 2 (two) times daily for 5 days., Disp: 10 capsule, Rfl: 0   pantoprazole  (PROTONIX ) 20 MG tablet, TAKE 1 TABLET (20 MG TOTAL) BY MOUTH 2 (TWO) TIMES DAILY BEFORE A MEAL., Disp: 180 tablet, Rfl: 1   Probiotic Product (PROBIOTIC DAILY  PO), Take by mouth daily., Disp: , Rfl:    Semaglutide , 1 MG/DOSE, (OZEMPIC , 1 MG/DOSE,) 4 MG/3ML SOPN, INJECT 1 MG INTO THE SKIN ONE TIME PER WEEK, Disp: 6 mL, Rfl: 1   tamsulosin  (FLOMAX ) 0.4 MG CAPS capsule, Take 1 capsule (0.4 mg total) by mouth daily. (Patient not taking: Reported on 09/26/2024), Disp: 15 capsule, Rfl: 3   Medications ordered in this encounter:  No orders of the defined types were placed in this encounter.    *If you need refills on other medications prior to your next appointment, please contact your pharmacy*  Follow-Up: Call back or seek an in-person evaluation if the symptoms worsen or if the condition fails to improve as  anticipated.  [facility] [phone]  Other Instructions Continue to hydrate and rest. Start a saline nasal rinse to flush out your nasal passages. Okay to continue the prescription nasal steroid spray. I have sent in a new cough medicine for you to use for further control of cough. Continue your antiviral medicine. Take the prednisone  pack as directed. If you note any non-resolving, new, or worsening symptoms despite treatment, please seek an in-person evaluation ASAP.    If you have been instructed to have an in-person evaluation today at a local Urgent Care facility, please use the link below. It will take you to a list of all of our available [facility], including address, phone number and hours of operation. Please do not delay care.  [facility]  If you or a family member do not have a primary care provider, use the link below to schedule a visit and establish care. When you choose a [facility] primary care physician or advanced practice provider, you gain a long-term partner in health. Find a Primary Care Provider  Learn more about [facility]'s in-office and virtual care options: [facility] - Get Care Now

## 2024-11-01 NOTE — Telephone Encounter (Signed)
 Spoke to pt she has virtual visit today to see if they can prescribe something else because the medication they gave her is not working. She will reach back out after visit and we will go from there if she thinks she needs a visit in office

## 2024-11-01 NOTE — Telephone Encounter (Signed)
 Spouse picked up medication today.

## 2024-11-07 ENCOUNTER — Other Ambulatory Visit (INDEPENDENT_AMBULATORY_CARE_PROVIDER_SITE_OTHER)

## 2024-11-07 ENCOUNTER — Encounter (INDEPENDENT_AMBULATORY_CARE_PROVIDER_SITE_OTHER): Payer: Self-pay | Admitting: Nurse Practitioner

## 2024-11-07 ENCOUNTER — Ambulatory Visit (INDEPENDENT_AMBULATORY_CARE_PROVIDER_SITE_OTHER): Admitting: Nurse Practitioner

## 2024-11-07 VITALS — BP 179/85 | HR 65 | Resp 18 | Ht 72.0 in | Wt 177.8 lb

## 2024-11-07 DIAGNOSIS — E119 Type 2 diabetes mellitus without complications: Secondary | ICD-10-CM | POA: Diagnosis not present

## 2024-11-07 DIAGNOSIS — I728 Aneurysm of other specified arteries: Secondary | ICD-10-CM

## 2024-11-07 DIAGNOSIS — I10 Essential (primary) hypertension: Secondary | ICD-10-CM

## 2024-11-12 ENCOUNTER — Encounter (INDEPENDENT_AMBULATORY_CARE_PROVIDER_SITE_OTHER): Payer: Self-pay | Admitting: Nurse Practitioner

## 2024-11-12 ENCOUNTER — Encounter: Payer: Self-pay | Admitting: Family

## 2024-11-12 DIAGNOSIS — I728 Aneurysm of other specified arteries: Secondary | ICD-10-CM | POA: Insufficient documentation

## 2024-11-12 NOTE — Progress Notes (Signed)
 Subjective:    Patient ID: Danielle Dickerson, female    DOB: [birth year], 68 y.o.   MRN: [id number] Chief Complaint  Patient presents with   Establish Care    New patient consult + mesenteric consult abnormal XR abdomen. Ref. Arnett    HPI  Discussed the use of AI scribe software for clinical note transcription with the patient, who gave verbal consent to proceed.  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 She was referred by another provider for evaluation of a splenic artery aneurysm.  A splenic artery aneurysm was incidentally discovered during an x-ray performed to evaluate for kidney stones. Initially estimated at 0.9 cm on the x-ray, a subsequent CT scan did not detect the aneurysm, but further evaluation with mesenteric duplex today confirmed its presence at 0.7 cm.  She has a history of kidney stones, which have previously resulted in significant pain and hospitalization for sepsis. She experiences pain similar to past kidney stone episodes, although recent tests have not confirmed the presence of a new stone. She has a history of hematuria associated with kidney stones, but recent tests have been inconclusive.  Her blood pressure was recorded at 175 mmHg, which she states is unusually high for her. She attributes the elevated blood pressure to possible stress or anxiety related to the visit.    Results RADIOLOGY Abdominal X-ray: Splenic artery aneurysm, calcified, 0.9 cm Mesenteric duplex: Splenic artery aneurysm, 0.7 cm Mesenteric vessels evaluation: No significant plaque or stenosis   Review of Systems  Genitourinary:  Positive for flank pain.  All other systems reviewed and are negative.      Objective:   Physical Exam Vitals reviewed.  HENT:     Head: Normocephalic.  Cardiovascular:     Rate and Rhythm: Normal rate.     Pulses:  Normal pulses.  Pulmonary:     Effort: Pulmonary effort is normal.  Skin:    General: Skin is warm and dry.  Neurological:     Mental Status: She is alert and oriented to person, place, and time.  Psychiatric:        Mood and Affect: Mood normal.        Behavior: Behavior normal.        Thought Content: Thought content normal.        Judgment: Judgment normal.     Physical Exam  CARDIOVASCULAR: Mesenteric vessels without significant plaque or stenosis.  BP (!) 179/85 (BP Location: Right Arm, Patient Position: Sitting, Cuff Size: Normal)   Pulse 65   Resp 18   Ht 6' (1.829 m)   Wt 177 lb 12.8 oz (80.6 kg)   BMI 24.11 kg/m   Past Medical History:  Diagnosis Date   COVID-19    05/27/21   Depression    Diabetes mellitus without complication (HCC)    Type II   Eosinophilic esophagitis    Esophageal stricture 2019   GERD (gastroesophageal reflux disease)    Hyperlipidemia    Pernicious anemia     Social History   Socioeconomic History   Marital status: Married    Spouse name: Not on file   Number of children: Not on file   Years of education: Not on file   Highest education level: Associate degree: academic program  Occupational History   Not on file  Tobacco Use   Smoking status: Never   Smokeless  tobacco: Never  Vaping Use   Vaping status: Never Used  Substance and Sexual Activity   Alcohol use: Not Currently   Drug use: Never   Sexual activity: Yes    Partners: Male  Other Topics Concern   Not on file  Social History Narrative   Moved from [place]  07/2019      Married   Husband retired   2 daughters that live in [place]      Homeschooling 6 yo grandson, Scientist, Clinical (histocompatibility And Immunogenetics)   Social Drivers of Health   Tobacco Use: Low Risk (11/07/2024)   Patient History    Smoking Tobacco Use: Never    Smokeless Tobacco Use: Never    Passive Exposure: Not on file  Financial Resource Strain: Low Risk (10/03/2024)   Overall Financial Resource Strain (CARDIA)     Difficulty of Paying Living Expenses: Not very hard  Food Insecurity: No Food Insecurity (10/03/2024)   Epic    Worried About Radiation Protection Practitioner of Food in the Last Year: Never true    Ran Out of Food in the Last Year: Never true  Transportation Needs: No Transportation Needs (10/03/2024)   Epic    Lack of Transportation (Medical): No    Lack of Transportation (Non-Medical): No  Physical Activity: Sufficiently Active (10/03/2024)   Exercise Vital Sign    Days of Exercise per Week: 5 days    Minutes of Exercise per Session: 50 min  Stress: No Stress Concern Present (10/03/2024)   [employer] of Occupational Health - Occupational Stress Questionnaire    Feeling of Stress: Only a little  Social Connections: Moderately Isolated (10/03/2024)   Social Connection and Isolation Panel    Frequency of Communication with Friends and Family: More than three times a week    Frequency of Social Gatherings with Friends and Family: More than three times a week    Attends Religious Services: Never    Database Administrator or Organizations: No    Attends Engineer, Structural: Not on file    Marital Status: Married  Intimate Partner Violence: Not At Risk (09/26/2024)   Epic    Fear of Current or Ex-Partner: No    Emotionally Abused: No    Physically Abused: No    Sexually Abused: No  Depression (PHQ2-9): Low Risk (09/26/2024)   Depression (PHQ2-9)    PHQ-2 Score: 0  Alcohol Screen: Low Risk (09/26/2024)   Alcohol Screen    Last Alcohol Screening Score (AUDIT): 0  Housing: Low Risk (10/03/2024)   Epic    Unable to Pay for Housing in the Last Year: No    Number of Times Moved in the Last Year: 0    Homeless in the Last Year: No  Utilities: Not At Risk (09/26/2024)   Epic    Threatened with loss of utilities: No  Health Literacy: Adequate Health Literacy (09/26/2024)   B1300 Health Literacy    Frequency of need for help with medical instructions: Never    Past Surgical History:   Procedure Laterality Date   BREAST EXCISIONAL BIOPSY Right 2009   phyllodes removed    BREAST SURGERY     CESAREAN SECTION     CHOLECYSTECTOMY OPEN     COLONOSCOPY WITH PROPOFOL  N/A 02/02/2021   Procedure: COLONOSCOPY WITH PROPOFOL ;  Surgeon: Unk Corinn Skiff, MD;  Location: [facility] ENDOSCOPY;  Service: Gastroenterology;  Laterality: N/A;   CYSTOSCOPY WITH STENT PLACEMENT Right 09/07/2021   Procedure: CYSTOSCOPY WITH STENT PLACEMENT; WITH RETROGRADE PYELOGRAM;  Surgeon: Penne Knee,  MD;  Location: [facility] ORS;  Service: Urology;  Laterality: Right;   CYSTOSCOPY/RETROGRADE/URETEROSCOPY/STONE EXTRACTION WITH BASKET  09/21/2021   Procedure: CYSTOSCOPY/RETROGRADE/URETEROSCOPY/STONE EXTRACTION WITH BASKET;  Surgeon: Penne Knee, MD;  Location: [facility] ORS;  Service: Urology;;   ESOPHAGOGASTRODUODENOSCOPY (EGD) WITH PROPOFOL  N/A 10/07/2021   Procedure: ESOPHAGOGASTRODUODENOSCOPY (EGD) WITH PROPOFOL ;  Surgeon: Unk Corinn Skiff, MD;  Location: [facility] END[facility]COPY;  Service: Gastroenterology;  Laterality: N/A;   SUPRACERVICAL ABDOMINAL HYSTERECTOMY  2006   has ovaries. Had heavy periods and was anemic. HAS cervix   TONSILLECTOMY  1966    Family History  Problem Relation Age of Onset   Congestive Heart Failure Mother    CVA Mother 73   Colon cancer Father    Esophageal cancer Maternal Uncle    Thyroid  cancer Neg Hx    Breast cancer Neg Hx     Allergies[1]     Latest Ref Rng & Units 10/05/2024   12:32 PM 07/13/2024   10:50 AM 02/21/2024    7:49 AM  CBC  WBC 4.0 - 10.5 K/uL 6.0  6.0  7.1   Hemoglobin 12.0 - 15.0 g/dL 88.2  88.4  87.9   Hematocrit 36.0 - 46.0 % 34.3  33.6  35.1   Platelets 150 - 400 K/uL 166  159.0  172.0       CMP     Component Value Date/Time   NA 139 10/05/2024 1232   K 4.1 10/05/2024 1232   CL 102 10/05/2024 1232   CO2 26 10/05/2024 1232   GLUCOSE 112 (H) 10/05/2024 1232   BUN 19 10/05/2024 1232   CREATININE 0.78 10/05/2024 1232   CALCIUM  9.1 10/05/2024  1232   PROT 7.3 10/05/2024 1232   ALBUMIN 3.9 10/05/2024 1232   AST 22 10/05/2024 1232   ALT 17 10/05/2024 1232   ALKPHOS 66 10/05/2024 1232   BILITOT 0.6 10/05/2024 1232   GFR 84.50 09/17/2024 0839   GFRNONAA >60 10/05/2024 1232     No results found.     Assessment & Plan:   1. Splenic artery aneurysm (Primary) Splenic artery aneurysm Calcified 0.7 cm aneurysm identified, smaller than initial estimate. Not concerning due to size and calcification. Intervention at 2.5 cm or rapid growth. Low rupture risk as she is not of childbearing age. - Scheduled follow-up imaging in 6 months to monitor growth. - Advised against heavy lifting and contact sports. - Instructed to monitor for symptoms indicating rupture. - Continue routine blood pressure monitoring at home.  2. Primary hypertension Continue antihypertensive medications as already ordered, these medications have been reviewed and there are no changes at this time.  3. Type 2 diabetes mellitus without complication, without long-term current use of insulin  (HCC) Continue hypoglycemic medications as already ordered, these medications have been reviewed and there are no changes at this time.  Hgb A1C to be monitored as already arranged by primary service   Assessment and Plan Assessment & Plan      Medications Ordered Prior to Encounter[2]  There are no Patient Instructions on file for this visit. No follow-ups on file.   Sevyn Paredez E Raushanah Osmundson, NP      [1]  Allergies Allergen Reactions   Pneumovax 23 [Pneumococcal Vac Polyvalent] Swelling    Causes arm swelling, redness.   Prevnar 13 [Pneumococcal 13-Val Conj Vacc] Swelling  [2]  Current Outpatient Medications on File Prior to Visit  Medication Sig Dispense Refill   Accu-Chek FastClix Lancets MISC Used to check blood sugars twice a day. 100 each 3  aspirin EC 81 MG tablet Take 81 mg by mouth daily.     atorvastatin  (LIPITOR) 40 MG tablet TAKE 1 TABLET BY MOUTH  EVERY DAY 90 tablet 3   Blood Glucose Monitoring Suppl (ACCU-CHEK GUIDE ME) w/Device KIT USE AS DIRECTED 1 kit 0   Calcium -Vitamin D -Vitamin K (VIACTIV CALCIUM  PLUS D PO) Take 1 tablet by mouth daily.     Cholecalciferol (VITAMIN D3 PO) Take 400 mg by mouth daily.     citalopram  (CELEXA ) 20 MG tablet TAKE 1 TABLET BY MOUTH EVERY DAY 90 tablet 2   docusate sodium  (COLACE) 100 MG capsule Take 100 mg by mouth daily.     famotidine (PEPCID) 20 MG tablet Take 20 mg by mouth at bedtime.     ferrous sulfate 325 (65 FE) MG EC tablet Take 325 mg by mouth 3 (three) times daily with meals.     glucose blood (ACCU-CHEK AVIVA PLUS) test strip Used to check blood sugars twice a day. 100 each 5   hydrOXYzine  (ATARAX ) 10 MG tablet TAKE 1-2 TABLETS (10-20 MG TOTAL) BY MOUTH 2 (TWO) TIMES DAILY AS NEEDED FOR ANXIETY. 270 tablet 2   Insulin  Pen Needle (PEN NEEDLES) 32G X 4 MM MISC Used to give Victoza  injections. 100 each 1   ipratropium (ATROVENT ) 0.03 % nasal spray Place 2 sprays into both nostrils every 12 (twelve) hours. 30 mL 0   Lancets (ONETOUCH ULTRASOFT) lancets Used to check blood sugars once a day. 100 each 4   lisinopril  (ZESTRIL ) 5 MG tablet TAKE 1 TABLET (5 MG TOTAL) BY MOUTH DAILY. 90 tablet 1   metFORMIN  (GLUCOPHAGE ) 1000 MG tablet TAKE 1 TABLET (1,000 MG TOTAL) BY MOUTH TWICE A DAY WITH FOOD 180 tablet 1   Multiple Vitamins-Minerals (CENTRUM SILVER ADULT 50+ PO) Take 1 tablet by mouth daily.     ondansetron  (ZOFRAN  ODT) 4 MG disintegrating tablet Take 1 tablet (4 mg total) by mouth every 8 (eight) hours as needed. 30 tablet 1   pantoprazole  (PROTONIX ) 20 MG tablet TAKE 1 TABLET (20 MG TOTAL) BY MOUTH 2 (TWO) TIMES DAILY BEFORE A MEAL. 180 tablet 1   predniSONE  (STERAPRED UNI-PAK 21 TAB) 10 MG (21) TBPK tablet Take following package directions 21 tablet 0   Probiotic Product (PROBIOTIC DAILY PO) Take by mouth daily.     promethazine -dextromethorphan (PROMETHAZINE -DM) 6.25-15 MG/5ML syrup Take 5 mLs  by mouth 4 (four) times daily as needed for cough. 118 mL 0   Semaglutide , 1 MG/DOSE, (OZEMPIC , 1 MG/DOSE,) 4 MG/3ML SOPN INJECT 1 MG INTO THE SKIN ONE TIME PER WEEK 6 mL 1   tamsulosin  (FLOMAX ) 0.4 MG CAPS capsule Take 1 capsule (0.4 mg total) by mouth daily. (Patient not taking: Reported on 09/26/2024) 15 capsule 3   No current facility-administered medications on file prior to visit.

## 2024-11-19 ENCOUNTER — Ambulatory Visit (INDEPENDENT_AMBULATORY_CARE_PROVIDER_SITE_OTHER)

## 2024-11-19 DIAGNOSIS — E538 Deficiency of other specified B group vitamins: Secondary | ICD-10-CM

## 2024-11-19 MED ORDER — CYANOCOBALAMIN 1000 MCG/ML IJ SOLN
1000.0000 ug | Freq: Once | INTRAMUSCULAR | Status: AC
  Administered 2024-11-19: 1000 ug via INTRAMUSCULAR

## 2024-11-19 NOTE — Progress Notes (Signed)
 Pt presented for their vitamin B12 injection. Pt was identified through two identifiers. Pt tolerated shot well in their left deltoid.

## 2024-12-03 ENCOUNTER — Other Ambulatory Visit (INDEPENDENT_AMBULATORY_CARE_PROVIDER_SITE_OTHER)

## 2024-12-03 DIAGNOSIS — R899 Unspecified abnormal finding in specimens from other organs, systems and tissues: Secondary | ICD-10-CM

## 2024-12-03 LAB — CBC WITH DIFFERENTIAL/PLATELET
Basophils Absolute: 0 K/uL (ref 0.0–0.1)
Basophils Relative: 0.5 % (ref 0.0–3.0)
Eosinophils Absolute: 0.1 K/uL (ref 0.0–0.7)
Eosinophils Relative: 2.1 % (ref 0.0–5.0)
HCT: 32.4 % — ABNORMAL LOW (ref 36.0–46.0)
Hemoglobin: 11.2 g/dL — ABNORMAL LOW (ref 12.0–15.0)
Lymphocytes Relative: 28.7 % (ref 12.0–46.0)
Lymphs Abs: 1.5 K/uL (ref 0.7–4.0)
MCHC: 34.5 g/dL (ref 30.0–36.0)
MCV: 85 fl (ref 78.0–100.0)
Monocytes Absolute: 0.3 K/uL (ref 0.1–1.0)
Monocytes Relative: 6.2 % (ref 3.0–12.0)
Neutro Abs: 3.3 K/uL (ref 1.4–7.7)
Neutrophils Relative %: 62.5 % (ref 43.0–77.0)
Platelets: 178 K/uL (ref 150.0–400.0)
RBC: 3.81 Mil/uL — ABNORMAL LOW (ref 3.87–5.11)
RDW: 13.3 % (ref 11.5–15.5)
WBC: 5.3 K/uL (ref 4.0–10.5)

## 2024-12-04 LAB — IRON,TIBC AND FERRITIN PANEL
%SAT: 22 % (ref 16–45)
Ferritin: 12 ng/mL — ABNORMAL LOW (ref 16–288)
Iron: 66 ug/dL (ref 45–160)
TIBC: 301 ug/dL (ref 250–450)

## 2024-12-18 ENCOUNTER — Encounter: Payer: Self-pay | Admitting: Family

## 2024-12-19 MED ORDER — OZEMPIC (1 MG/DOSE) 4 MG/3ML ~~LOC~~ SOPN: PEN_INJECTOR | SUBCUTANEOUS | 2 refills | Status: AC

## 2024-12-19 NOTE — Addendum Note (Signed)
 Addended by: Mattew Chriswell G on: 12/19/2024 09:27 AM   Modules accepted: Orders

## 2024-12-20 ENCOUNTER — Ambulatory Visit

## 2024-12-26 ENCOUNTER — Ambulatory Visit

## 2025-01-02 ENCOUNTER — Ambulatory Visit

## 2025-01-02 DIAGNOSIS — E538 Deficiency of other specified B group vitamins: Secondary | ICD-10-CM

## 2025-01-02 MED ORDER — CYANOCOBALAMIN 1000 MCG/ML IJ SOLN
1000.0000 ug | Freq: Once | INTRAMUSCULAR | Status: AC
  Administered 2025-01-02: 1000 ug via INTRAMUSCULAR

## 2025-01-02 NOTE — Progress Notes (Signed)
 Pt received B12 injection in Left  deltoid muscle. Pt tolerated it well with no complaints or concerns.

## 2025-01-04 ENCOUNTER — Encounter: Payer: Self-pay | Admitting: Family

## 2025-01-04 ENCOUNTER — Ambulatory Visit: Admitting: Family

## 2025-01-04 VITALS — BP 124/72 | HR 72 | Temp 98.4°F | Ht 72.0 in | Wt 181.2 lb

## 2025-01-04 DIAGNOSIS — D509 Iron deficiency anemia, unspecified: Secondary | ICD-10-CM | POA: Insufficient documentation

## 2025-01-04 DIAGNOSIS — F411 Generalized anxiety disorder: Secondary | ICD-10-CM

## 2025-01-04 DIAGNOSIS — K219 Gastro-esophageal reflux disease without esophagitis: Secondary | ICD-10-CM

## 2025-01-04 DIAGNOSIS — R7309 Other abnormal glucose: Secondary | ICD-10-CM

## 2025-01-04 DIAGNOSIS — E119 Type 2 diabetes mellitus without complications: Secondary | ICD-10-CM

## 2025-01-04 LAB — POCT GLYCOSYLATED HEMOGLOBIN (HGB A1C): HbA1c POC (<> result, manual entry): 6.5 %

## 2025-01-04 MED ORDER — PANTOPRAZOLE SODIUM 20 MG PO TBEC: 20.0000 mg | DELAYED_RELEASE_TABLET | Freq: Every day | ORAL | 3 refills | Status: AC

## 2025-01-04 NOTE — Assessment & Plan Note (Signed)
 Chronic, overall stable.  Patient politely declines adjustments to medication regimen at this time.  Continue Celexa  20 mg daily

## 2025-01-04 NOTE — Progress Notes (Signed)
 "  Assessment & Plan:  Iron deficiency anemia, unspecified iron deficiency anemia type Assessment & Plan: Suspected IDA.  Colonoscopy is up-to-date.  Continue ferrous sulfate 325 mg once daily with a small glass of orange juice.  If iron stores, hemoglobin do not normalize at follow-up, will arrange hematology, gastroenterology consult.  Orders: -     CBC with Differential/Platelet; Future -     Iron, TIBC and Ferritin Panel; Future  Elevated glucose -     POCT glycosylated hemoglobin (Hb A1C)  Gastroesophageal reflux disease, unspecified whether esophagitis present -     Pantoprazole  Sodium; Take 1 tablet (20 mg total) by mouth daily.  Dispense: 90 tablet; Refill: 3  Type 2 diabetes mellitus without complication, without long-term current use of insulin  [facility]) Assessment & Plan: Lab Results  Component Value Date   HGBA1C 6.5 01/04/2025  Chronic, excellent control.  Continue metformin  1000mg , ozempic  1mg  for now.    GAD (generalized anxiety disorder) Assessment & Plan: Chronic, overall stable.  Patient politely declines adjustments to medication regimen at this time.  Continue Celexa  20 mg daily      Return precautions given.   Risks, benefits, and alternatives of the medications and treatment plan prescribed today were discussed, and patient expressed understanding.   Education regarding symptom management and diagnosis given to patient on AVS either electronically or printed.  Return in about 3 months (around 04/03/2025).  [name], FNP  Subjective:    Patient ID: Danielle Dickerson, female    DOB: Feb 14, 1956, 69 y.o.   MRN: [id number]  Danielle Dickerson is a 69 y.o. female who presents today for follow up.   HPI: HPI follow up DM and anemia, suspected IDA Discussed the use of AI scribe software for clinical note transcription with the patient, who gave verbal consent to proceed.  History of Present Illness   Danielle Dickerson is a 69 year old female with anemia  who presents for follow-up on her iron levels and overall health management.  She is experiencing anemia and is currently taking 325 mg of iron supplements.    She has a history of anxiety over familial stress and current world events/political climate.  She has been less active recently, attributing this to cold weather and personal stress, and notes weight gain as a result.   She is currently managing her stress by engaging in activities like baking, waking.     She mains compliant with Celexa  20 mg daily.  She has not used Atarax  10 mg.    no red blood cells in urine 10/12/2024 Colonoscopy from 2022 appears up-to-date.   Allergies: Pneumovax 23 [pneumococcal vac polyvalent] and Prevnar 13 [pneumococcal 13-val conj vacc] Medications Ordered Prior to Encounter[1]  Review of Systems  Constitutional:  Negative for chills and fever.  Respiratory:  Negative for cough.   Cardiovascular:  Negative for chest pain and palpitations.  Gastrointestinal:  Negative for nausea and vomiting.  Genitourinary:  Negative for hematuria.      Objective:    BP 124/72   Pulse 72   Temp 98.4 F (36.9 C) (Oral)   Ht 6' (1.829 m)   Wt 181 lb 3.2 oz (82.2 kg)   SpO2 97%   BMI 24.58 kg/m  BP Readings from Last 3 Encounters:  01/04/25 124/72  11/07/24 (!) 179/85  10/05/24 136/64   Wt Readings from Last 3 Encounters:  01/04/25 181 lb 3.2 oz (82.2 kg)  11/07/24 177 lb 12.8 oz (80.6 kg)  10/05/24  176 lb 9.6 oz (80.1 kg)      01/04/2025    8:55 AM 09/26/2024   11:05 AM 09/03/2024    9:00 AM  Depression screen PHQ 2/9  Decreased Interest 0 0 0  Down, Depressed, Hopeless 1 0 0  PHQ - 2 Score 1 0 0  Altered sleeping 0 0 0  Tired, decreased energy 1 0 0  Change in appetite 0 0 0  Feeling bad or failure about yourself  0 0 0  Trouble concentrating 0 0 0  Moving slowly or fidgety/restless 0 0 0  Suicidal thoughts 0 0 0  PHQ-9 Score 2 0  0   Difficult doing work/chores Not difficult at all  Not difficult at all Not difficult at all     Data saved with a previous flowsheet row definition      01/04/2025    8:55 AM 09/03/2024    9:00 AM 01/31/2024   11:21 AM 08/04/2023    8:46 AM  GAD 7 : Generalized Anxiety Score  Nervous, Anxious, on Edge 0 0  1  0   Control/stop worrying 0 0  1  0   Worry too much - different things 0 0  1  0   Trouble relaxing 0 0  0  0   Restless 0 0  0  0   Easily annoyed or irritable 0 0  2  0   Afraid - awful might happen 0 0  1  0   Total GAD 7 Score 0 0 6 0  Anxiety Difficulty Not difficult at all Not difficult at all Somewhat difficult Not difficult at all     Data saved with a previous flowsheet row definition      Physical Exam Vitals reviewed.  Constitutional:      Appearance: She is well-developed.  Eyes:     Conjunctiva/sclera: Conjunctivae normal.  Cardiovascular:     Rate and Rhythm: Normal rate and regular rhythm.     Pulses: Normal pulses.     Heart sounds: Normal heart sounds.  Pulmonary:     Effort: Pulmonary effort is normal.     Breath sounds: Normal breath sounds. No wheezing, rhonchi or rales.  Skin:    General: Skin is warm and dry.  Neurological:     Mental Status: She is alert.  Psychiatric:        Speech: Speech normal.        Behavior: Behavior normal.        Thought Content: Thought content normal.            [1]  Current Outpatient Medications on File Prior to Visit  Medication Sig Dispense Refill   Accu-Chek FastClix Lancets MISC Used to check blood sugars twice a day. 100 each 3   aspirin EC 81 MG tablet Take 81 mg by mouth daily.     atorvastatin  (LIPITOR) 40 MG tablet TAKE 1 TABLET BY MOUTH EVERY DAY 90 tablet 3   Blood Glucose Monitoring Suppl (ACCU-CHEK GUIDE ME) w/Device KIT USE AS DIRECTED 1 kit 0   Calcium -Vitamin D -Vitamin K (VIACTIV CALCIUM  PLUS D PO) Take 1 tablet by mouth daily.     Cholecalciferol (VITAMIN D3 PO) Take 400 mg by mouth daily.     citalopram  (CELEXA ) 20 MG tablet TAKE 1  TABLET BY MOUTH EVERY DAY 90 tablet 2   docusate sodium  (COLACE) 100 MG capsule Take 100 mg by mouth daily.     famotidine (PEPCID) 20 MG tablet  Take 20 mg by mouth at bedtime.     ferrous sulfate 325 (65 FE) MG EC tablet Take 325 mg by mouth 3 (three) times daily with meals.     glucose blood (ACCU-CHEK AVIVA PLUS) test strip Used to check blood sugars twice a day. 100 each 5   hydrOXYzine  (ATARAX ) 10 MG tablet TAKE 1-2 TABLETS (10-20 MG TOTAL) BY MOUTH 2 (TWO) TIMES DAILY AS NEEDED FOR ANXIETY. 270 tablet 2   Insulin  Pen Needle (PEN NEEDLES) 32G X 4 MM MISC Used to give Victoza  injections. 100 each 1   ipratropium (ATROVENT ) 0.03 % nasal spray Place 2 sprays into both nostrils every 12 (twelve) hours. 30 mL 0   Lancets (ONETOUCH ULTRASOFT) lancets Used to check blood sugars once a day. 100 each 4   lisinopril  (ZESTRIL ) 5 MG tablet TAKE 1 TABLET (5 MG TOTAL) BY MOUTH DAILY. 90 tablet 1   metFORMIN  (GLUCOPHAGE ) 1000 MG tablet TAKE 1 TABLET (1,000 MG TOTAL) BY MOUTH TWICE A DAY WITH FOOD 180 tablet 1   Multiple Vitamins-Minerals (CENTRUM SILVER ADULT 50+ PO) Take 1 tablet by mouth daily.     ondansetron  (ZOFRAN  ODT) 4 MG disintegrating tablet Take 1 tablet (4 mg total) by mouth every 8 (eight) hours as needed. 30 tablet 1   Probiotic Product (PROBIOTIC DAILY PO) Take by mouth daily.     Semaglutide , 1 MG/DOSE, (OZEMPIC , 1 MG/DOSE,) 4 MG/3ML SOPN INJECT 1 MG INTO THE SKIN ONE TIME PER WEEK 6 mL 2   tamsulosin  (FLOMAX ) 0.4 MG CAPS capsule Take 1 capsule (0.4 mg total) by mouth daily. 15 capsule 3   No current facility-administered medications on file prior to visit.   "

## 2025-01-04 NOTE — Assessment & Plan Note (Signed)
 Lab Results  Component Value Date   HGBA1C 6.5 01/04/2025  Chronic, excellent control.  Continue metformin  1000mg , ozempic  1mg  for now.

## 2025-01-04 NOTE — Assessment & Plan Note (Signed)
 Suspected IDA.  Colonoscopy is up-to-date.  Continue ferrous sulfate 325 mg once daily with a small glass of orange juice.  If iron stores, hemoglobin do not normalize at follow-up, will arrange hematology, gastroenterology consult.

## 2025-01-30 ENCOUNTER — Ambulatory Visit

## 2025-02-15 ENCOUNTER — Other Ambulatory Visit

## 2025-04-03 ENCOUNTER — Ambulatory Visit: Admitting: Family

## 2025-05-08 ENCOUNTER — Encounter (INDEPENDENT_AMBULATORY_CARE_PROVIDER_SITE_OTHER)

## 2025-05-08 ENCOUNTER — Ambulatory Visit (INDEPENDENT_AMBULATORY_CARE_PROVIDER_SITE_OTHER): Admitting: Nurse Practitioner

## 2025-10-01 ENCOUNTER — Ambulatory Visit
